# Patient Record
Sex: Male | Born: 1955 | Race: Black or African American | Hispanic: No | Marital: Married | Smoking: Former smoker
Health system: Southern US, Community
[De-identification: ages and names within clinical notes are randomized; demographics above are authoritative.]

## PROBLEM LIST (undated history)

## (undated) DIAGNOSIS — J449 Chronic obstructive pulmonary disease, unspecified: Secondary | ICD-10-CM

## (undated) DIAGNOSIS — I10 Essential (primary) hypertension: Secondary | ICD-10-CM

## (undated) DIAGNOSIS — G4733 Obstructive sleep apnea (adult) (pediatric): Secondary | ICD-10-CM

## (undated) DIAGNOSIS — E785 Hyperlipidemia, unspecified: Secondary | ICD-10-CM

## (undated) DIAGNOSIS — E119 Type 2 diabetes mellitus without complications: Secondary | ICD-10-CM

## (undated) HISTORY — PX: KNEE SURGERY: SHX244

## (undated) HISTORY — DX: Chronic obstructive pulmonary disease, unspecified: J44.9

## (undated) HISTORY — DX: Obstructive sleep apnea (adult) (pediatric): G47.33

## (undated) HISTORY — DX: Type 2 diabetes mellitus without complications: E11.9

## (undated) HISTORY — DX: Hyperlipidemia, unspecified: E78.5

## (undated) HISTORY — PX: HAND SURGERY: SHX662

## (undated) HISTORY — DX: Essential (primary) hypertension: I10

---

## 2007-10-28 ENCOUNTER — Emergency Department: Payer: Self-pay | Admitting: Emergency Medicine

## 2007-10-28 ENCOUNTER — Other Ambulatory Visit: Payer: Self-pay

## 2008-03-17 ENCOUNTER — Emergency Department: Payer: Self-pay | Admitting: Emergency Medicine

## 2010-01-18 ENCOUNTER — Ambulatory Visit: Payer: Self-pay | Admitting: Vascular Surgery

## 2010-08-15 DIAGNOSIS — I10 Essential (primary) hypertension: Secondary | ICD-10-CM | POA: Insufficient documentation

## 2010-08-15 DIAGNOSIS — F325 Major depressive disorder, single episode, in full remission: Secondary | ICD-10-CM | POA: Insufficient documentation

## 2010-08-15 DIAGNOSIS — G40909 Epilepsy, unspecified, not intractable, without status epilepticus: Secondary | ICD-10-CM | POA: Insufficient documentation

## 2011-10-11 DIAGNOSIS — E119 Type 2 diabetes mellitus without complications: Secondary | ICD-10-CM | POA: Insufficient documentation

## 2011-10-11 DIAGNOSIS — E559 Vitamin D deficiency, unspecified: Secondary | ICD-10-CM | POA: Insufficient documentation

## 2012-08-21 DIAGNOSIS — N184 Chronic kidney disease, stage 4 (severe): Secondary | ICD-10-CM | POA: Diagnosis not present

## 2012-09-24 DIAGNOSIS — Z125 Encounter for screening for malignant neoplasm of prostate: Secondary | ICD-10-CM | POA: Diagnosis not present

## 2012-09-24 DIAGNOSIS — I10 Essential (primary) hypertension: Secondary | ICD-10-CM | POA: Diagnosis not present

## 2012-09-24 DIAGNOSIS — E7800 Pure hypercholesterolemia, unspecified: Secondary | ICD-10-CM | POA: Diagnosis not present

## 2012-09-24 DIAGNOSIS — Z1159 Encounter for screening for other viral diseases: Secondary | ICD-10-CM | POA: Diagnosis not present

## 2012-10-28 DIAGNOSIS — I10 Essential (primary) hypertension: Secondary | ICD-10-CM | POA: Diagnosis not present

## 2012-10-28 DIAGNOSIS — E119 Type 2 diabetes mellitus without complications: Secondary | ICD-10-CM | POA: Diagnosis not present

## 2012-10-28 DIAGNOSIS — E785 Hyperlipidemia, unspecified: Secondary | ICD-10-CM | POA: Diagnosis not present

## 2013-06-30 DIAGNOSIS — I10 Essential (primary) hypertension: Secondary | ICD-10-CM | POA: Diagnosis not present

## 2013-06-30 DIAGNOSIS — E7800 Pure hypercholesterolemia, unspecified: Secondary | ICD-10-CM | POA: Diagnosis not present

## 2013-06-30 DIAGNOSIS — E1165 Type 2 diabetes mellitus with hyperglycemia: Secondary | ICD-10-CM | POA: Diagnosis not present

## 2013-06-30 DIAGNOSIS — Z23 Encounter for immunization: Secondary | ICD-10-CM | POA: Diagnosis not present

## 2013-06-30 DIAGNOSIS — N183 Chronic kidney disease, stage 3 unspecified: Secondary | ICD-10-CM | POA: Diagnosis not present

## 2013-06-30 DIAGNOSIS — E1129 Type 2 diabetes mellitus with other diabetic kidney complication: Secondary | ICD-10-CM | POA: Diagnosis not present

## 2013-10-01 DIAGNOSIS — Z125 Encounter for screening for malignant neoplasm of prostate: Secondary | ICD-10-CM | POA: Diagnosis not present

## 2013-10-01 DIAGNOSIS — E1129 Type 2 diabetes mellitus with other diabetic kidney complication: Secondary | ICD-10-CM | POA: Diagnosis not present

## 2013-10-01 DIAGNOSIS — E119 Type 2 diabetes mellitus without complications: Secondary | ICD-10-CM | POA: Diagnosis not present

## 2013-10-01 DIAGNOSIS — E1165 Type 2 diabetes mellitus with hyperglycemia: Secondary | ICD-10-CM | POA: Diagnosis not present

## 2013-10-01 DIAGNOSIS — I10 Essential (primary) hypertension: Secondary | ICD-10-CM | POA: Diagnosis not present

## 2013-10-01 DIAGNOSIS — N183 Chronic kidney disease, stage 3 unspecified: Secondary | ICD-10-CM | POA: Diagnosis not present

## 2013-10-01 DIAGNOSIS — E7800 Pure hypercholesterolemia, unspecified: Secondary | ICD-10-CM | POA: Diagnosis not present

## 2013-10-06 DIAGNOSIS — E559 Vitamin D deficiency, unspecified: Secondary | ICD-10-CM | POA: Diagnosis not present

## 2013-10-06 DIAGNOSIS — R809 Proteinuria, unspecified: Secondary | ICD-10-CM | POA: Insufficient documentation

## 2013-10-06 DIAGNOSIS — I129 Hypertensive chronic kidney disease with stage 1 through stage 4 chronic kidney disease, or unspecified chronic kidney disease: Secondary | ICD-10-CM | POA: Diagnosis not present

## 2013-10-06 DIAGNOSIS — N183 Chronic kidney disease, stage 3 unspecified: Secondary | ICD-10-CM | POA: Diagnosis not present

## 2013-10-06 DIAGNOSIS — E119 Type 2 diabetes mellitus without complications: Secondary | ICD-10-CM | POA: Diagnosis not present

## 2013-10-20 DIAGNOSIS — E1139 Type 2 diabetes mellitus with other diabetic ophthalmic complication: Secondary | ICD-10-CM | POA: Diagnosis not present

## 2013-11-03 DIAGNOSIS — F17200 Nicotine dependence, unspecified, uncomplicated: Secondary | ICD-10-CM | POA: Diagnosis not present

## 2013-11-03 DIAGNOSIS — Z1331 Encounter for screening for depression: Secondary | ICD-10-CM | POA: Diagnosis not present

## 2013-11-03 DIAGNOSIS — Z0000 Encounter for general adult medical examination without abnormal findings: Secondary | ICD-10-CM | POA: Diagnosis not present

## 2013-11-03 DIAGNOSIS — Z1211 Encounter for screening for malignant neoplasm of colon: Secondary | ICD-10-CM | POA: Diagnosis not present

## 2013-11-03 DIAGNOSIS — Z125 Encounter for screening for malignant neoplasm of prostate: Secondary | ICD-10-CM | POA: Diagnosis not present

## 2014-01-06 ENCOUNTER — Ambulatory Visit: Payer: Self-pay | Admitting: Gastroenterology

## 2014-01-06 DIAGNOSIS — J45909 Unspecified asthma, uncomplicated: Secondary | ICD-10-CM | POA: Diagnosis not present

## 2014-01-06 DIAGNOSIS — D125 Benign neoplasm of sigmoid colon: Secondary | ICD-10-CM | POA: Diagnosis not present

## 2014-01-06 DIAGNOSIS — E119 Type 2 diabetes mellitus without complications: Secondary | ICD-10-CM | POA: Diagnosis not present

## 2014-01-06 DIAGNOSIS — K5790 Diverticulosis of intestine, part unspecified, without perforation or abscess without bleeding: Secondary | ICD-10-CM | POA: Diagnosis not present

## 2014-01-06 DIAGNOSIS — Z86010 Personal history of colonic polyps: Secondary | ICD-10-CM | POA: Diagnosis not present

## 2014-01-06 DIAGNOSIS — D123 Benign neoplasm of transverse colon: Secondary | ICD-10-CM | POA: Diagnosis not present

## 2014-01-06 DIAGNOSIS — I10 Essential (primary) hypertension: Secondary | ICD-10-CM | POA: Diagnosis not present

## 2014-01-07 LAB — PATHOLOGY REPORT

## 2014-01-25 DIAGNOSIS — J449 Chronic obstructive pulmonary disease, unspecified: Secondary | ICD-10-CM | POA: Diagnosis not present

## 2014-01-25 DIAGNOSIS — Z23 Encounter for immunization: Secondary | ICD-10-CM | POA: Diagnosis not present

## 2014-01-25 DIAGNOSIS — E780 Pure hypercholesterolemia: Secondary | ICD-10-CM | POA: Diagnosis not present

## 2014-01-25 DIAGNOSIS — Z716 Tobacco abuse counseling: Secondary | ICD-10-CM | POA: Diagnosis not present

## 2014-01-25 DIAGNOSIS — I10 Essential (primary) hypertension: Secondary | ICD-10-CM | POA: Diagnosis not present

## 2014-01-25 DIAGNOSIS — E1165 Type 2 diabetes mellitus with hyperglycemia: Secondary | ICD-10-CM | POA: Diagnosis not present

## 2014-01-25 DIAGNOSIS — E1129 Type 2 diabetes mellitus with other diabetic kidney complication: Secondary | ICD-10-CM | POA: Diagnosis not present

## 2014-03-01 DIAGNOSIS — E1165 Type 2 diabetes mellitus with hyperglycemia: Secondary | ICD-10-CM | POA: Diagnosis not present

## 2014-03-01 DIAGNOSIS — N183 Chronic kidney disease, stage 3 (moderate): Secondary | ICD-10-CM | POA: Diagnosis not present

## 2014-03-01 DIAGNOSIS — E1129 Type 2 diabetes mellitus with other diabetic kidney complication: Secondary | ICD-10-CM | POA: Diagnosis not present

## 2014-03-01 DIAGNOSIS — I10 Essential (primary) hypertension: Secondary | ICD-10-CM | POA: Diagnosis not present

## 2014-03-23 ENCOUNTER — Ambulatory Visit: Payer: Self-pay | Admitting: Family Medicine

## 2014-03-23 DIAGNOSIS — E785 Hyperlipidemia, unspecified: Secondary | ICD-10-CM | POA: Diagnosis not present

## 2014-03-23 DIAGNOSIS — Z7189 Other specified counseling: Secondary | ICD-10-CM | POA: Diagnosis not present

## 2014-03-23 DIAGNOSIS — E119 Type 2 diabetes mellitus without complications: Secondary | ICD-10-CM | POA: Diagnosis not present

## 2014-03-23 DIAGNOSIS — I10 Essential (primary) hypertension: Secondary | ICD-10-CM | POA: Diagnosis not present

## 2014-03-26 ENCOUNTER — Ambulatory Visit: Payer: Self-pay | Admitting: Family Medicine

## 2014-03-26 DIAGNOSIS — I10 Essential (primary) hypertension: Secondary | ICD-10-CM | POA: Diagnosis not present

## 2014-03-26 DIAGNOSIS — E119 Type 2 diabetes mellitus without complications: Secondary | ICD-10-CM | POA: Diagnosis not present

## 2014-03-26 DIAGNOSIS — E785 Hyperlipidemia, unspecified: Secondary | ICD-10-CM | POA: Diagnosis not present

## 2014-04-12 DIAGNOSIS — R809 Proteinuria, unspecified: Secondary | ICD-10-CM | POA: Diagnosis not present

## 2014-04-12 DIAGNOSIS — E559 Vitamin D deficiency, unspecified: Secondary | ICD-10-CM | POA: Diagnosis not present

## 2014-04-12 DIAGNOSIS — N183 Chronic kidney disease, stage 3 (moderate): Secondary | ICD-10-CM | POA: Diagnosis not present

## 2014-04-12 DIAGNOSIS — E785 Hyperlipidemia, unspecified: Secondary | ICD-10-CM | POA: Diagnosis not present

## 2014-04-12 DIAGNOSIS — I10 Essential (primary) hypertension: Secondary | ICD-10-CM | POA: Diagnosis not present

## 2014-04-12 DIAGNOSIS — E119 Type 2 diabetes mellitus without complications: Secondary | ICD-10-CM | POA: Diagnosis not present

## 2014-04-21 DIAGNOSIS — R809 Proteinuria, unspecified: Secondary | ICD-10-CM | POA: Diagnosis not present

## 2014-04-21 DIAGNOSIS — N183 Chronic kidney disease, stage 3 (moderate): Secondary | ICD-10-CM | POA: Diagnosis not present

## 2014-04-21 DIAGNOSIS — E559 Vitamin D deficiency, unspecified: Secondary | ICD-10-CM | POA: Diagnosis not present

## 2014-04-21 DIAGNOSIS — E1122 Type 2 diabetes mellitus with diabetic chronic kidney disease: Secondary | ICD-10-CM | POA: Diagnosis not present

## 2014-04-26 ENCOUNTER — Ambulatory Visit: Payer: Self-pay | Admitting: Family Medicine

## 2014-05-05 DIAGNOSIS — J449 Chronic obstructive pulmonary disease, unspecified: Secondary | ICD-10-CM | POA: Diagnosis not present

## 2014-05-05 DIAGNOSIS — I10 Essential (primary) hypertension: Secondary | ICD-10-CM | POA: Diagnosis not present

## 2014-05-05 DIAGNOSIS — E1122 Type 2 diabetes mellitus with diabetic chronic kidney disease: Secondary | ICD-10-CM | POA: Diagnosis not present

## 2014-05-05 DIAGNOSIS — E119 Type 2 diabetes mellitus without complications: Secondary | ICD-10-CM | POA: Diagnosis not present

## 2014-05-05 DIAGNOSIS — E780 Pure hypercholesterolemia: Secondary | ICD-10-CM | POA: Diagnosis not present

## 2014-05-05 DIAGNOSIS — N183 Chronic kidney disease, stage 3 (moderate): Secondary | ICD-10-CM | POA: Diagnosis not present

## 2014-08-03 DIAGNOSIS — E1122 Type 2 diabetes mellitus with diabetic chronic kidney disease: Secondary | ICD-10-CM | POA: Diagnosis not present

## 2014-08-03 DIAGNOSIS — N183 Chronic kidney disease, stage 3 (moderate): Secondary | ICD-10-CM | POA: Diagnosis not present

## 2014-08-03 DIAGNOSIS — E780 Pure hypercholesterolemia: Secondary | ICD-10-CM | POA: Diagnosis not present

## 2014-08-03 DIAGNOSIS — I10 Essential (primary) hypertension: Secondary | ICD-10-CM | POA: Diagnosis not present

## 2014-08-03 DIAGNOSIS — K121 Other forms of stomatitis: Secondary | ICD-10-CM | POA: Diagnosis not present

## 2014-08-03 DIAGNOSIS — J449 Chronic obstructive pulmonary disease, unspecified: Secondary | ICD-10-CM | POA: Diagnosis not present

## 2014-08-10 DIAGNOSIS — K121 Other forms of stomatitis: Secondary | ICD-10-CM | POA: Diagnosis not present

## 2014-09-03 DIAGNOSIS — E119 Type 2 diabetes mellitus without complications: Secondary | ICD-10-CM | POA: Diagnosis not present

## 2014-09-03 LAB — HM DIABETES EYE EXAM

## 2014-09-14 DIAGNOSIS — H6123 Impacted cerumen, bilateral: Secondary | ICD-10-CM | POA: Diagnosis not present

## 2014-09-14 DIAGNOSIS — H93299 Other abnormal auditory perceptions, unspecified ear: Secondary | ICD-10-CM | POA: Diagnosis not present

## 2014-09-14 DIAGNOSIS — D1030 Benign neoplasm of unspecified part of mouth: Secondary | ICD-10-CM | POA: Diagnosis not present

## 2014-10-25 ENCOUNTER — Other Ambulatory Visit: Payer: Self-pay | Admitting: Family Medicine

## 2014-10-25 DIAGNOSIS — E559 Vitamin D deficiency, unspecified: Secondary | ICD-10-CM | POA: Diagnosis not present

## 2014-10-25 DIAGNOSIS — E1122 Type 2 diabetes mellitus with diabetic chronic kidney disease: Secondary | ICD-10-CM | POA: Diagnosis not present

## 2014-10-25 DIAGNOSIS — N183 Chronic kidney disease, stage 3 (moderate): Secondary | ICD-10-CM | POA: Diagnosis not present

## 2014-10-25 DIAGNOSIS — R809 Proteinuria, unspecified: Secondary | ICD-10-CM | POA: Diagnosis not present

## 2014-10-26 ENCOUNTER — Telehealth: Payer: Self-pay | Admitting: Family Medicine

## 2014-10-26 NOTE — Telephone Encounter (Signed)
Requesting refill on Metoprolol and his sugar medication. States the pharmacy faxed over a request.

## 2014-10-27 ENCOUNTER — Telehealth: Payer: Self-pay | Admitting: Emergency Medicine

## 2014-10-27 NOTE — Telephone Encounter (Signed)
Scripts sent

## 2014-11-08 DIAGNOSIS — E559 Vitamin D deficiency, unspecified: Secondary | ICD-10-CM | POA: Diagnosis not present

## 2014-11-08 DIAGNOSIS — E1122 Type 2 diabetes mellitus with diabetic chronic kidney disease: Secondary | ICD-10-CM | POA: Diagnosis not present

## 2014-11-08 DIAGNOSIS — R809 Proteinuria, unspecified: Secondary | ICD-10-CM | POA: Diagnosis not present

## 2014-11-08 DIAGNOSIS — N183 Chronic kidney disease, stage 3 (moderate): Secondary | ICD-10-CM | POA: Diagnosis not present

## 2014-11-09 ENCOUNTER — Encounter: Payer: Self-pay | Admitting: Family Medicine

## 2014-11-20 ENCOUNTER — Other Ambulatory Visit: Payer: Self-pay | Admitting: Family Medicine

## 2014-12-02 ENCOUNTER — Ambulatory Visit (INDEPENDENT_AMBULATORY_CARE_PROVIDER_SITE_OTHER): Payer: Medicare Other | Admitting: Family Medicine

## 2014-12-02 ENCOUNTER — Encounter: Payer: Self-pay | Admitting: Family Medicine

## 2014-12-02 VITALS — BP 118/64 | HR 67 | Temp 97.9°F | Resp 16 | Ht 68.0 in | Wt 184.0 lb

## 2014-12-02 DIAGNOSIS — Z0000 Encounter for general adult medical examination without abnormal findings: Secondary | ICD-10-CM

## 2014-12-02 DIAGNOSIS — Z1211 Encounter for screening for malignant neoplasm of colon: Secondary | ICD-10-CM

## 2014-12-02 DIAGNOSIS — Z23 Encounter for immunization: Secondary | ICD-10-CM

## 2014-12-02 NOTE — Progress Notes (Signed)
Name: Zachary Wallace   MRN: [id number]    DOB: 1955/06/19   Date:12/02/2014       Progress Note  Subjective  Chief Complaint  Chief Complaint  Patient presents with  . Annual Exam    HPI  Patient here for annual H&P. Baseline medical problems have been stable.  Depression screen PHQ 2/9 12/02/2014  Decreased Interest 0  Down, Depressed, Hopeless 0  PHQ - 2 Score 0   Functional Status Survey: Is the patient deaf or have difficulty hearing?: No Does the patient have difficulty seeing, even when wearing glasses/contacts?: No Does the patient have difficulty concentrating, remembering, or making decisions?: No Does the patient have difficulty walking or climbing stairs?: No Does the patient have difficulty dressing or bathing?: No Does the patient have difficulty doing errands alone such as visiting a doctor's office or shopping?: No  No flowsheet data found.   Past Medical History  Diagnosis Date  . COPD (chronic obstructive pulmonary disease)   . Hyperlipidemia   . Hypertension   . Diabetes mellitus without complication     Social History  Substance Use Topics  . Smoking status: Current Every Day Smoker  . Smokeless tobacco: Not on file  . Alcohol Use: No     Current outpatient prescriptions:  .  aspirin 81 MG tablet, Take 81 mg by mouth., Disp: , Rfl:  .  atorvastatin (LIPITOR) 40 MG tablet, TAKE ONE TABLET BY MOUTH AT BEDTIME., Disp: 30 tablet, Rfl: 0 .  chlorthalidone (HYGROTON) 25 MG tablet, TAKE 1 TABLET BY MOUTH ONCE DAILY., Disp: 30 tablet, Rfl: 0 .  glimepiride (AMARYL) 2 MG tablet, TAKE 1 TABLET BY MOUTH ONCE DAILY., Disp: 30 tablet, Rfl: 3 .  glucose blood (ONETOUCH VERIO) test strip, , Disp: , Rfl:  .  losartan (COZAAR) 100 MG tablet, TAKE 1 TABLET BY MOUTH ONCE DAILY., Disp: 30 tablet, Rfl: 0 .  metoprolol (LOPRESSOR) 100 MG tablet, TAKE 1 TABLET BY MOUTH TWICE DAILY, Disp: 60 tablet, Rfl: 3 .  PROAIR HFA 108 (90 BASE) MCG/ACT inhaler, , Disp: , Rfl:  .   SPIRIVA HANDIHALER 18 MCG inhalation capsule, , Disp: , Rfl:  .  amLODipine (NORVASC) 10 MG tablet, TAKE 1 TABLET BY MOUTH ONCE DAILY., Disp: 30 tablet, Rfl: 0  Not on File  Review of Systems  Constitutional: Negative for fever, chills and weight loss.  HENT: Negative for congestion, hearing loss, sore throat and tinnitus.   Eyes: Negative for blurred vision, double vision and redness.  Respiratory: Positive for cough and sputum production. Negative for hemoptysis and shortness of breath.   Cardiovascular: Negative for chest pain, palpitations, orthopnea, claudication and leg swelling.  Gastrointestinal: Negative for heartburn, nausea, vomiting, diarrhea, constipation and blood in stool.  Genitourinary: Negative for dysuria, urgency, frequency and hematuria.  Musculoskeletal: Negative for myalgias, back pain, joint pain, falls and neck pain.  Skin: Negative for itching.  Neurological: Negative for dizziness, tingling, tremors, focal weakness, seizures, loss of consciousness, weakness and headaches.  Endo/Heme/Allergies: Does not bruise/bleed easily.  Psychiatric/Behavioral: Negative for depression and substance abuse. The patient is not nervous/anxious and does not have insomnia.      Objective  Filed Vitals:   12/02/14 0919  BP: 118/64  Pulse: 67  Temp: 97.9 F (36.6 C)  Resp: 16  Height: 5\' 8"  (1.727 m)  Weight: 184 lb (83.462 kg)  SpO2: 94%     Physical Exam  Constitutional: He is oriented to person, place, and time and well-developed,  well-nourished, and in no distress.  HENT:  Head: Normocephalic.  Eyes: EOM are normal. Pupils are equal, round, and reactive to light.  Neck: Normal range of motion. Neck supple. No thyromegaly present.  Cardiovascular: Normal rate, regular rhythm, normal heart sounds and intact distal pulses.   No murmur heard. Pulmonary/Chest: Effort normal and breath sounds normal. No respiratory distress. He has no wheezes.  Abdominal: Soft. Bowel  sounds are normal.  Genitourinary: Rectum normal, prostate normal and penis normal. Guaiac negative stool. No discharge found.  Musculoskeletal: Normal range of motion. He exhibits no edema.  Lymphadenopathy:    He has no cervical adenopathy.  Neurological: He is alert and oriented to person, place, and time. No cranial nerve deficit. Gait normal. Coordination normal.  Skin: Skin is warm and dry. No rash noted.  Psychiatric: Affect and judgment normal.      Assessment & Plan

## 2014-12-06 ENCOUNTER — Ambulatory Visit: Payer: Self-pay | Admitting: Family Medicine

## 2015-01-10 ENCOUNTER — Ambulatory Visit (INDEPENDENT_AMBULATORY_CARE_PROVIDER_SITE_OTHER): Payer: Medicare Other | Admitting: Family Medicine

## 2015-01-10 ENCOUNTER — Encounter: Payer: Self-pay | Admitting: Family Medicine

## 2015-01-10 ENCOUNTER — Ambulatory Visit
Admission: RE | Admit: 2015-01-10 | Discharge: 2015-01-10 | Disposition: A | Discharge status: Home / Self Care | Payer: Medicare Other | Source: Ambulatory Visit | Attending: Family Medicine | Admitting: Family Medicine

## 2015-01-10 VITALS — BP 128/88 | HR 70 | Temp 98.7°F | Resp 16 | Ht 68.0 in | Wt 186.4 lb

## 2015-01-10 DIAGNOSIS — N183 Chronic kidney disease, stage 3 unspecified: Secondary | ICD-10-CM

## 2015-01-10 DIAGNOSIS — M545 Low back pain: Secondary | ICD-10-CM | POA: Diagnosis not present

## 2015-01-10 DIAGNOSIS — I10 Essential (primary) hypertension: Secondary | ICD-10-CM

## 2015-01-10 DIAGNOSIS — M25551 Pain in right hip: Secondary | ICD-10-CM

## 2015-01-10 DIAGNOSIS — M25552 Pain in left hip: Secondary | ICD-10-CM | POA: Insufficient documentation

## 2015-01-10 DIAGNOSIS — M5416 Radiculopathy, lumbar region: Secondary | ICD-10-CM | POA: Diagnosis not present

## 2015-01-10 DIAGNOSIS — E1169 Type 2 diabetes mellitus with other specified complication: Secondary | ICD-10-CM

## 2015-01-10 LAB — GLUCOSE, POCT (MANUAL RESULT ENTRY): POC Glucose: 174 mg/dL — AB (ref 70–99)

## 2015-01-10 LAB — POCT UA - MICROALBUMIN: MICROALBUMIN (UR) POC: 20 mg/L

## 2015-01-10 LAB — POCT GLYCOSYLATED HEMOGLOBIN (HGB A1C): Hemoglobin A1C: 6.5

## 2015-01-10 MED ORDER — TRAMADOL HCL 50 MG PO TABS: 50.0000 mg | ORAL_TABLET | Freq: Three times a day (TID) | ORAL | Status: DC | PRN

## 2015-01-10 NOTE — Progress Notes (Signed)
Name: Zachary Wallace   MRN: [id number]    DOB: 1955/09/14   Date:01/10/2015       Progress Note  Subjective  Chief Complaint  Chief Complaint  Patient presents with  . Diabetes    pt here for 1 month follow up  . Chronic Kidney Disease  . COPD  . Hypertension    HPI  Diabetes  Patient presents for follow-up of diabetes which is present for over 5 years. Is currently on a regimen of glimepiride2 . Patient states somewhat compliant with their diet and exercise. There's been no hypoglycemic episodes and there no symptoms polyuria polydipsia polyphagia. His average fasting glucoses been in the low around mid 150s with a high around 174 . There is chronic renal end organ disease.  Last diabetic eye exam was within the last year.   Last visit with dietitian was 1 year ago today. Last microalbumin was obtained today .   Hypertension   Patient presents for follow-up of hypertension. It has been present for over over 5 years.  Patient states that there is compliance with medical regimen which consists of amlodipine 10 mg daily losartan 100 mg daily . There is no end organ disease. Cardiac risk factors include hypertension hyperlipidemia and diabetes.  Exercise regimen consist of limited walking .  Diet consist of some salt restriction .  COPD history of present illness  Patient continues to smoke about 1 pack per week. He has minimal cough. Is currently on Spiriva once daily and uses albuterol (since he is not RECENTLY. There've been no recent ER visits severe upper respiratory infection  Hyperlipidemia  Patient has a history of hyperlipidemia for over 5 years .  Current medical regimen consist of atorvastatin grams daily at bedtime .  Compliance is good .  Diet and exercise are currently followed intermittently .  Risk factors for cardiovascular disease include hyperlipidemia , hypertension diabetes, tobacco abuse .   There have been no side effects from the medication.    Past Medical History   Diagnosis Date  . COPD (chronic obstructive pulmonary disease) ([place])   . Hyperlipidemia   . Hypertension   . Diabetes mellitus without complication [facility])     Social History  Substance Use Topics  . Smoking status: Current Every Day Smoker  . Smokeless tobacco: Not on file  . Alcohol Use: No     Current outpatient prescriptions:  .  amLODipine (NORVASC) 10 MG tablet, TAKE 1 TABLET BY MOUTH ONCE DAILY., Disp: 30 tablet, Rfl: 0 .  aspirin 81 MG tablet, Take 81 mg by mouth., Disp: , Rfl:  .  atorvastatin (LIPITOR) 40 MG tablet, TAKE ONE TABLET BY MOUTH AT BEDTIME., Disp: 30 tablet, Rfl: 0 .  chlorthalidone (HYGROTON) 25 MG tablet, TAKE 1 TABLET BY MOUTH ONCE DAILY., Disp: 30 tablet, Rfl: 0 .  glimepiride (AMARYL) 2 MG tablet, TAKE 1 TABLET BY MOUTH ONCE DAILY., Disp: 30 tablet, Rfl: 3 .  glucose blood (ONETOUCH VERIO) test strip, , Disp: , Rfl:  .  losartan (COZAAR) 100 MG tablet, TAKE 1 TABLET BY MOUTH ONCE DAILY., Disp: 30 tablet, Rfl: 0 .  metoprolol (LOPRESSOR) 100 MG tablet, TAKE 1 TABLET BY MOUTH TWICE DAILY, Disp: 60 tablet, Rfl: 3 .  PROAIR HFA 108 (90 BASE) MCG/ACT inhaler, , Disp: , Rfl:  .  SPIRIVA HANDIHALER 18 MCG inhalation capsule, , Disp: , Rfl:  .  chlorhexidine (PERIDEX) 0.12 % solution, , Disp: , Rfl:  .  HYDROcodone-acetaminophen (NORCO) 7.5-325 MG  tablet, , Disp: , Rfl:  .  traMADol (ULTRAM) 50 MG tablet, Take 1 tablet (50 mg total) by mouth every 8 (eight) hours as needed., Disp: 30 tablet, Rfl: 0  No Known Allergies  Review of Systems  Constitutional: Negative for fever, chills and weight loss.  HENT: Negative for congestion, hearing loss, sore throat and tinnitus.   Eyes: Negative for blurred vision, double vision and redness.  Respiratory: Positive for cough and sputum production. Negative for hemoptysis, shortness of breath and wheezing.   Cardiovascular: Negative for chest pain, palpitations, orthopnea, claudication and leg swelling.  Gastrointestinal:  Negative.  Negative for heartburn, nausea, vomiting, diarrhea, constipation and blood in stool.  Genitourinary: Negative.  Negative for dysuria, urgency, frequency and hematuria.  Musculoskeletal: Positive for joint pain (left hip pain left hip). Negative for myalgias, back pain, falls and neck pain.  Skin: Negative for itching.  Neurological: Negative for dizziness, tingling, tremors, focal weakness, seizures, loss of consciousness, weakness and headaches.  Endo/Heme/Allergies: Does not bruise/bleed easily.  Psychiatric/Behavioral: Positive for depression (improved). Negative for substance abuse. The patient is not nervous/anxious and does not have insomnia.      Objective  Filed Vitals:   01/10/15 0816  BP: 128/88  Pulse: 70  Temp: 98.7 F (37.1 C)  Resp: 16  Height: 5\' 8"  (1.727 m)  Weight: 186 lb 7 oz (84.567 kg)  SpO2: 94%     Physical Exam  Constitutional: He is oriented to person, place, and time and well-developed, well-nourished, and in no distress.  HENT:  Head: Normocephalic.  Eyes: EOM are normal. Pupils are equal, round, and reactive to light.  Neck: Normal range of motion. Neck supple. No thyromegaly present.  Cardiovascular: Normal rate, regular rhythm and normal heart sounds.   No murmur heard. Pulmonary/Chest: Effort normal. No respiratory distress. He has no wheezes.  Diminished breath sounds throughout with hyperresonance to percussion.  PFTs show very severe obstruction prebronchodilator ends here postbronchodilator FEV1 markedly reduced   Abdominal: Soft. Bowel sounds are normal.  Musculoskeletal: Normal range of motion. He exhibits no edema.  Lymphadenopathy:    He has no cervical adenopathy.  Neurological: He is alert and oriented to person, place, and time. No cranial nerve deficit. Gait normal. Coordination normal.  Skin: Skin is warm and dry. No rash noted.  Psychiatric: Affect and judgment normal.      Assessment & Plan  1. Type 2 diabetes  mellitus with other specified complication (HCC) Well-controlled - POCT Glucose (CBG) - POCT HgB A1C - POCT UA - Microalbumin  2. CKD (chronic kidney disease), unspecified stage Followed by nephrologist  3. Essential hypertension Well-controlled  4. Chronic kidney disease (CKD), stage III (moderate) Stable nephrologist  5. Lumbar radiculopathy Significant. We'll obtain LS-spine - traMADol (ULTRAM) 50 MG tablet; Take 1 tablet (50 mg total) by mouth every 8 (eight) hours as needed.  Dispense: 30 tablet; Refill: 0  6. Left hip pain Left hip x-ray will start tramadol - traMADol (ULTRAM) 50 MG tablet; Take 1 tablet (50 mg total) by mouth every 8 (eight) hours as needed.  Dispense: 30 tablet; Refill: 0  7. Hip pain, acute, right Less severe than left - DG Lumbar Spine Complete; Future - DG HIP UNILAT WITH PELVIS 2-3 VIEWS RIGHT; Future

## 2015-01-13 ENCOUNTER — Encounter: Payer: Self-pay | Admitting: Family Medicine

## 2015-01-28 ENCOUNTER — Other Ambulatory Visit: Payer: Self-pay | Admitting: Emergency Medicine

## 2015-01-28 MED ORDER — AMLODIPINE BESYLATE 10 MG PO TABS: 10.0000 mg | ORAL_TABLET | Freq: Every day | ORAL | Status: DC

## 2015-01-28 MED ORDER — LOSARTAN POTASSIUM 100 MG PO TABS: 100.0000 mg | ORAL_TABLET | Freq: Every day | ORAL | Status: DC

## 2015-02-10 ENCOUNTER — Ambulatory Visit (INDEPENDENT_AMBULATORY_CARE_PROVIDER_SITE_OTHER): Payer: Medicare Other | Admitting: Family Medicine

## 2015-02-10 ENCOUNTER — Encounter: Payer: Self-pay | Admitting: Family Medicine

## 2015-02-10 DIAGNOSIS — M5416 Radiculopathy, lumbar region: Secondary | ICD-10-CM

## 2015-02-10 DIAGNOSIS — M25552 Pain in left hip: Secondary | ICD-10-CM

## 2015-02-10 DIAGNOSIS — J449 Chronic obstructive pulmonary disease, unspecified: Secondary | ICD-10-CM | POA: Insufficient documentation

## 2015-02-10 MED ORDER — TRAMADOL HCL 50 MG PO TABS: 50.0000 mg | ORAL_TABLET | Freq: Three times a day (TID) | ORAL | Status: DC | PRN

## 2015-02-10 MED ORDER — GABAPENTIN 300 MG PO CAPS: 300.0000 mg | ORAL_CAPSULE | Freq: Three times a day (TID) | ORAL | Status: DC

## 2015-02-10 NOTE — Progress Notes (Signed)
Name: Zachary Wallace   MRN: [id number]    DOB: 1955-04-05   Date:02/10/2015       Progress Note  Subjective  Chief Complaint  Chief Complaint  Patient presents with  . Back Pain    1 month follow up, pain radiates down right leg    HPI  Low back pain.  Patient's l pain  lumbarwith radiculopathy to the right lower extremity is slightly improved on the tramadol. He had LS-spine x-ray performed. This showed no significant findings. The pain is keeping him awake a lot at night tramadol is providing some improvement   Past Medical History  Diagnosis Date  . COPD (chronic obstructive pulmonary disease) ([place])   . Hyperlipidemia   . Hypertension   . Diabetes mellitus without complication [facility])     Social History  Substance Use Topics  . Smoking status: Current Every Day Smoker  . Smokeless tobacco: Not on file  . Alcohol Use: No     Current outpatient prescriptions:  .  amLODipine (NORVASC) 10 MG tablet, Take 1 tablet (10 mg total) by mouth daily., Disp: 30 tablet, Rfl: 5 .  aspirin 81 MG tablet, Take 81 mg by mouth., Disp: , Rfl:  .  atorvastatin (LIPITOR) 40 MG tablet, TAKE ONE TABLET BY MOUTH AT BEDTIME., Disp: 30 tablet, Rfl: 0 .  chlorhexidine (PERIDEX) 0.12 % solution, , Disp: , Rfl:  .  chlorthalidone (HYGROTON) 25 MG tablet, TAKE 1 TABLET BY MOUTH ONCE DAILY., Disp: 30 tablet, Rfl: 0 .  glimepiride (AMARYL) 2 MG tablet, TAKE 1 TABLET BY MOUTH ONCE DAILY., Disp: 30 tablet, Rfl: 3 .  glucose blood (ONETOUCH VERIO) test strip, , Disp: , Rfl:  .  HYDROcodone-acetaminophen (NORCO) 7.5-325 MG tablet, , Disp: , Rfl:  .  losartan (COZAAR) 100 MG tablet, Take 1 tablet (100 mg total) by mouth daily., Disp: 30 tablet, Rfl: 5 .  metoprolol (LOPRESSOR) 100 MG tablet, TAKE 1 TABLET BY MOUTH TWICE DAILY, Disp: 60 tablet, Rfl: 3 .  PROAIR HFA 108 (90 BASE) MCG/ACT inhaler, , Disp: , Rfl:  .  SPIRIVA HANDIHALER 18 MCG inhalation capsule, , Disp: , Rfl:  .  traMADol (ULTRAM) 50 MG tablet,  Take 1 tablet (50 mg total) by mouth every 8 (eight) hours as needed., Disp: 30 tablet, Rfl: 0  No Known Allergies  Review of Systems  Musculoskeletal: Positive for back pain.  Neurological:       Radicular pain down the right lower extremity to the lateral aspect of the mid calf.     Objective  Filed Vitals:   02/10/15 0814  BP: 118/76  Pulse: 70  Temp: 98.6 F (37 C)  TempSrc: Oral  Resp: 18  Height: 5\' 8"  (1.727 m)  Weight: 185 lb 8 oz (84.142 kg)  SpO2: 94%     Physical Exam  Constitutional: He is well-developed, well-nourished, and in no distress.  Musculoskeletal:  Tender along the lower lumbar area. Straight leg raising is positive at 60 on the right negative on the left. Diminished DTRs.      Assessment & Plan  1. Lumbar radiculopathy Lab to avoid NSAIDs in view of risks EKG - traMADol (ULTRAM) 50 MG tablet; Take 1 tablet (50 mg total) by mouth every 8 (eight) hours as needed.  Dispense: 90 tablet; Refill: 0  2. Left hip pain  - traMADol (ULTRAM) 50 MG tablet; Take 1 tablet (50 mg total) by mouth every 8 (eight) hours as needed.  Dispense: 90 tablet; Refill:  0      

## 2015-02-23 ENCOUNTER — Telehealth: Payer: Self-pay

## 2015-02-23 NOTE — Telephone Encounter (Signed)
LFT MESS FOR PT TO CALL AND WAS ASKED TO SCHEDULE AN APPT TO DISCUSS WHAT Fox IS NEEIDNG.

## 2015-02-23 NOTE — Telephone Encounter (Signed)
Ask patient to return to office and we can discuss the relationship of his leg pain in his work situation

## 2015-02-23 NOTE — Telephone Encounter (Signed)
Santiago Glad from [employer] called and stated that she needs a work status note for pt. Pt was seen on 01/10/15 and was given a RTW note for 01/12/15. Her number is [phone] and claim number for case is [id number]

## 2015-02-28 ENCOUNTER — Ambulatory Visit (INDEPENDENT_AMBULATORY_CARE_PROVIDER_SITE_OTHER): Payer: Medicare Other | Admitting: Family Medicine

## 2015-02-28 ENCOUNTER — Encounter: Payer: Self-pay | Admitting: Family Medicine

## 2015-02-28 VITALS — BP 142/84 | HR 104 | Temp 97.8°F | Resp 18 | Ht 68.0 in | Wt 189.4 lb

## 2015-02-28 DIAGNOSIS — M545 Low back pain, unspecified: Secondary | ICD-10-CM

## 2015-02-28 DIAGNOSIS — M5416 Radiculopathy, lumbar region: Secondary | ICD-10-CM

## 2015-02-28 DIAGNOSIS — M5417 Radiculopathy, lumbosacral region: Secondary | ICD-10-CM | POA: Diagnosis not present

## 2015-02-28 DIAGNOSIS — G8929 Other chronic pain: Secondary | ICD-10-CM | POA: Diagnosis not present

## 2015-02-28 MED ORDER — CYCLOBENZAPRINE HCL 5 MG PO TABS: 5.0000 mg | ORAL_TABLET | Freq: Two times a day (BID) | ORAL | Status: DC

## 2015-02-28 NOTE — Progress Notes (Signed)
Name: Zachary Wallace   MRN: [id number]    DOB: 03-31-1955   Date:02/28/2015       Progress Note  Subjective  Chief Complaint  Chief Complaint  Patient presents with  . Back Pain    follow up    Back Pain This is a chronic problem. The current episode started more than 1 month ago. The problem occurs constantly. The problem is unchanged. The pain is present in the lumbar spine. The quality of the pain is described as aching and cramping. The pain radiates to the right knee, right thigh and right foot. The pain is at a severity of 7/10. The pain is moderate. The pain is the same all the time. The symptoms are aggravated by bending, coughing and twisting. Stiffness is present all day. Associated symptoms include leg pain, numbness and tingling. Pertinent negatives include no bladder incontinence, bowel incontinence, chest pain, dysuria, fever, headaches, pelvic pain, perianal numbness, weakness or weight loss. Risk factors include lack of exercise and recent trauma. He has tried analgesics, bed rest, muscle relaxant, NSAIDs, walking and heat for the symptoms. The treatment provided mild relief.   The incident occurred on 10/17 approximately. Patient was lifting some materials at work when he had acute onset of the right lumbar pain which radiated to his right lower extremit at best at the pain has been 7 out of 10 but his highest 10 out of 10 initially. 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 It was while lifting 61. That he first head injury occur.    we have spoken with his employer. They're wanting written information on his work limitations   Past Medical History  Diagnosis Date  . COPD (chronic obstructive pulmonary disease) ([place])   . Hyperlipidemia   . Hypertension   . Diabetes mellitus without complication [facility])      Social History  Substance Use Topics  . Smoking status: Current Every Day Smoker  . Smokeless tobacco: Not on file  . Alcohol Use: No     Current outpatient prescriptions:  .  amLODipine (NORVASC) 10 MG tablet, Take 1 tablet (10 mg total) by mouth daily., Disp: 30 tablet, Rfl: 5 .  aspirin 81 MG tablet, Take 81 mg by mouth., Disp: , Rfl:  .  atorvastatin (LIPITOR) 40 MG tablet, TAKE ONE TABLET BY MOUTH AT BEDTIME., Disp: 30 tablet, Rfl: 0 .  chlorhexidine (PERIDEX) 0.12 % solution, , Disp: , Rfl:  .  chlorthalidone (HYGROTON) 25 MG tablet, TAKE 1 TABLET BY MOUTH ONCE DAILY., Disp: 30 tablet, Rfl: 0 .  gabapentin (NEURONTIN) 300 MG capsule, Take 1 capsule (300 mg total) by mouth 3 (three) times daily., Disp: 90 capsule, Rfl: 3 .  glimepiride (AMARYL) 2 MG tablet, TAKE 1 TABLET BY MOUTH ONCE DAILY., Disp: 30 tablet, Rfl: 3 .  glucose blood (ONETOUCH VERIO) test strip, , Disp: , Rfl:  .  HYDROcodone-acetaminophen (NORCO) 7.5-325 MG tablet, , Disp: , Rfl:  .  losartan (COZAAR) 100 MG tablet, Take 1 tablet (100 mg total) by mouth daily., Disp: 30 tablet, Rfl: 5 .  metoprolol (LOPRESSOR) 100 MG tablet, TAKE 1 TABLET BY MOUTH TWICE DAILY, Disp: 60 tablet, Rfl: 3 .  PROAIR HFA 108 (90 BASE) MCG/ACT inhaler, , Disp: , Rfl:  .  SPIRIVA HANDIHALER 18 MCG inhalation capsule, , Disp: , Rfl:  .  traMADol (ULTRAM) 50 MG tablet, Take 1 tablet (50 mg total) by mouth every 8 (eight) hours as needed., Disp: 90 tablet, Rfl: 0  No Known Allergies  Review of Systems  Constitutional: Negative for fever, chills and weight loss.  HENT: Negative for congestion, hearing loss, sore throat and tinnitus.   Eyes: Negative for blurred vision, double vision and redness.  Respiratory: Negative for cough, hemoptysis and shortness of breath.   Cardiovascular: Negative for chest pain, palpitations, orthopnea, claudication and leg swelling.  Gastrointestinal: Negative for heartburn, nausea, vomiting, diarrhea,  constipation, blood in stool and bowel incontinence.  Genitourinary: Negative for bladder incontinence, dysuria, urgency, frequency, hematuria and pelvic pain.  Musculoskeletal: Positive for back pain. Negative for myalgias, joint pain, falls and neck pain.  Skin: Negative for itching.  Neurological: Positive for tingling and numbness. Negative for dizziness, tremors, focal weakness, seizures, loss of consciousness, weakness and headaches.  Endo/Heme/Allergies: Does not bruise/bleed easily.  Psychiatric/Behavioral: Negative for depression and substance abuse. The patient is not nervous/anxious and does not have insomnia.      Objective  Filed Vitals:   02/28/15 0735  BP: 142/84  Pulse: 104  Temp: 97.8 F (36.6 C)  TempSrc: Oral  Resp: 18  Height: 5\' 8"  (1.727 m)  Weight: 189 lb 6.4 oz (85.911 kg)  SpO2: 92%     Physical Exam  Constitutional: He is well-developed, well-nourished, and in no distress.  Musculoskeletal:  Muscle areas in the lumbar area with spasm greater on the right than left. There is tenderness to palpation over the sacral area particularly on the right. Straight leg raising is positive at 70 and on the left and 60 on the right. DTRs are diminished.      Assessment & Plan  1. Chronic LBP  - MR Lumbar Spine Wo Contrast; Future  2. Lumbar back pain with radiculopathy affecting right lower extremity  Add Flexeril 10 mg twice a day and return to office for recheck in 1 month

## 2015-03-01 ENCOUNTER — Other Ambulatory Visit: Payer: Self-pay | Admitting: Family Medicine

## 2015-03-14 ENCOUNTER — Ambulatory Visit: Payer: Medicare Other | Admitting: Family Medicine

## 2015-04-01 ENCOUNTER — Ambulatory Visit (INDEPENDENT_AMBULATORY_CARE_PROVIDER_SITE_OTHER): Payer: Medicare Other | Admitting: Family Medicine

## 2015-04-01 ENCOUNTER — Encounter: Payer: Self-pay | Admitting: Family Medicine

## 2015-04-01 VITALS — BP 128/82 | HR 65 | Temp 98.4°F | Resp 18 | Ht 68.0 in | Wt 190.6 lb

## 2015-04-01 DIAGNOSIS — M5416 Radiculopathy, lumbar region: Secondary | ICD-10-CM | POA: Diagnosis not present

## 2015-04-01 NOTE — Progress Notes (Signed)
Name: Zachary Wallace   MRN: [id number]    DOB: 28-Mar-1955   Date:04/01/2015       Progress Note  Subjective  Chief Complaint  Chief Complaint  Patient presents with  . Back Pain    1 month follow up     Back Pain This is a chronic problem. The current episode started more than 1 month ago. The problem occurs constantly. The problem has been gradually improving since onset. Pain location: L2-3 nerve root distribution anterior thigh. The quality of the pain is described as aching and shooting. The pain is moderate. The pain is worse during the night. The symptoms are aggravated by lying down. Associated symptoms include leg pain. Pertinent negatives include no chest pain, dysuria, fever, headaches, numbness, paresthesias, tingling, weakness or weight loss. He has tried muscle relaxant (Gabapentin and intermittent Toradol) for the symptoms.    Back pain follow-up  X-ray of the back plain films was negative.    Past Medical History  Diagnosis Date  . COPD (chronic obstructive pulmonary disease) ([place])   . Hyperlipidemia   . Hypertension   . Diabetes mellitus without complication [facility])     Social History  Substance Use Topics  . Smoking status: Current Every Day Smoker  . Smokeless tobacco: Not on file  . Alcohol Use: No     Current outpatient prescriptions:  .  amLODipine (NORVASC) 10 MG tablet, TAKE 1 TABLET BY MOUTH ONCE DAILY., Disp: 30 tablet, Rfl: 0 .  aspirin 81 MG tablet, Take 81 mg by mouth., Disp: , Rfl:  .  atorvastatin (LIPITOR) 40 MG tablet, TAKE ONE TABLET BY MOUTH AT BEDTIME., Disp: 30 tablet, Rfl: 0 .  chlorhexidine (PERIDEX) 0.12 % solution, , Disp: , Rfl:  .  chlorthalidone (HYGROTON) 25 MG tablet, TAKE 1 TABLET BY MOUTH ONCE DAILY., Disp: 30 tablet, Rfl: 0 .  cyclobenzaprine (FLEXERIL) 5 MG tablet, Take 1 tablet (5 mg total) by mouth 2 (two) times daily., Disp: 30 tablet, Rfl: 1 .  gabapentin (NEURONTIN) 300 MG capsule, Take 1 capsule (300 mg total) by mouth 3  (three) times daily., Disp: 90 capsule, Rfl: 3 .  glimepiride (AMARYL) 2 MG tablet, TAKE 1 TABLET BY MOUTH ONCE DAILY., Disp: 30 tablet, Rfl: 3 .  glucose blood (ONETOUCH VERIO) test strip, , Disp: , Rfl:  .  HYDROcodone-acetaminophen (NORCO) 7.5-325 MG tablet, , Disp: , Rfl:  .  losartan (COZAAR) 100 MG tablet, TAKE 1 TABLET BY MOUTH ONCE DAILY., Disp: 30 tablet, Rfl: 0 .  metoprolol (LOPRESSOR) 100 MG tablet, TAKE 1 TABLET BY MOUTH TWICE DAILY, Disp: 60 tablet, Rfl: 3 .  PROAIR HFA 108 (90 BASE) MCG/ACT inhaler, INHALE 2 PUFFS BY MOUTH FOUR TIMES DAILY AS NEEDED, Disp: 8.5 g, Rfl: 0 .  SPIRIVA HANDIHALER 18 MCG inhalation capsule, INHALE 1 CAPSULE AS DIRECTED ONCE DAILY., Disp: 30 capsule, Rfl: 0 .  traMADol (ULTRAM) 50 MG tablet, Take 1 tablet (50 mg total) by mouth every 8 (eight) hours as needed., Disp: 90 tablet, Rfl: 0  No Known Allergies  Review of Systems  Constitutional: Negative for fever, chills and weight loss.  HENT: Negative for congestion, hearing loss, sore throat and tinnitus.   Eyes: Negative for blurred vision, double vision and redness.  Respiratory: Negative for cough, hemoptysis and shortness of breath.   Cardiovascular: Negative for chest pain, palpitations, orthopnea, claudication and leg swelling.  Gastrointestinal: Negative for heartburn, nausea, vomiting, diarrhea, constipation and blood in stool.  Genitourinary: Negative for dysuria,  urgency, frequency and hematuria.  Musculoskeletal: Positive for back pain. Negative for myalgias, joint pain, falls and neck pain.  Skin: Negative for itching.  Neurological: Negative for dizziness, tingling, tremors, focal weakness, seizures, loss of consciousness, weakness, numbness, headaches and paresthesias.  Endo/Heme/Allergies: Does not bruise/bleed easily.  Psychiatric/Behavioral: Negative for depression and substance abuse. The patient is not nervous/anxious and does not have insomnia.      Objective  Filed Vitals:    04/01/15 0731  BP: 128/82  Pulse: 65  Temp: 98.4 F (36.9 C)  TempSrc: Oral  Resp: 18  Height: 5\' 8"  (1.727 m)  Weight: 190 lb 9.6 oz (86.456 kg)  SpO2: 96%     Physical Exam  Musculoskeletal: He exhibits tenderness.  Straight leg raising is negative. There is tenderness in the distribution of the right L2 through 3 2  nerve root distribution.      Assessment & Plan  1. Lumbar radiculopathy on the right with radiation to the right anterior thigh L2-3 Continue conservative management as is improving with gabapentin and intermittent tramadol if worsens we'll obtain MRI

## 2015-04-22 ENCOUNTER — Other Ambulatory Visit: Payer: Self-pay | Admitting: Family Medicine

## 2015-05-06 ENCOUNTER — Other Ambulatory Visit: Payer: Self-pay | Admitting: Family Medicine

## 2015-05-09 ENCOUNTER — Other Ambulatory Visit: Payer: Self-pay

## 2015-05-09 MED ORDER — SILDENAFIL CITRATE 100 MG PO TABS: 100.0000 mg | ORAL_TABLET | Freq: Every day | ORAL | Status: DC

## 2015-05-16 ENCOUNTER — Telehealth: Payer: Self-pay

## 2015-05-16 NOTE — Telephone Encounter (Signed)
I called [employer] on 05/16/15 at 4 p.m. to try to get MRI approved for patient but they denied it due to being considered a non-medical necessitate. [employer] wants patient to try steroid injections and Physical therapy first. Dr. Rutherford Nail can call and do a peer to peer if he feels as if the patient needs a MRI as a medically necessity at [phone].

## 2015-05-17 ENCOUNTER — Other Ambulatory Visit: Payer: Self-pay

## 2015-05-17 MED ORDER — SILDENAFIL CITRATE 100 MG PO TABS: 100.0000 mg | ORAL_TABLET | Freq: Every day | ORAL | Status: DC

## 2015-05-17 NOTE — Telephone Encounter (Signed)
Pt called back and I informed him of his insurance decision and pt stated that he is willing to try the injections and physical therapy as long as his insurance will pay for them. Was just informed that this is a workers comp claim and that it should be filed with his job. Left vm for pt to return my call.

## 2015-05-23 ENCOUNTER — Other Ambulatory Visit: Payer: Self-pay | Admitting: Family Medicine

## 2015-05-28 ENCOUNTER — Other Ambulatory Visit: Payer: Self-pay | Admitting: Family Medicine

## 2015-06-20 ENCOUNTER — Other Ambulatory Visit: Payer: Self-pay | Admitting: Family Medicine

## 2015-07-20 ENCOUNTER — Other Ambulatory Visit: Payer: Self-pay | Admitting: Family Medicine

## 2015-08-01 ENCOUNTER — Ambulatory Visit: Payer: Medicare Other | Admitting: Family Medicine

## 2015-08-02 ENCOUNTER — Other Ambulatory Visit: Payer: Self-pay | Admitting: Family Medicine

## 2015-08-16 ENCOUNTER — Encounter: Payer: Self-pay | Admitting: Family Medicine

## 2015-08-16 ENCOUNTER — Ambulatory Visit (INDEPENDENT_AMBULATORY_CARE_PROVIDER_SITE_OTHER): Payer: Medicare Other | Admitting: Family Medicine

## 2015-08-16 VITALS — BP 106/72 | HR 59 | Temp 97.8°F | Resp 16 | Wt 188.0 lb

## 2015-08-16 DIAGNOSIS — L309 Dermatitis, unspecified: Secondary | ICD-10-CM

## 2015-08-16 DIAGNOSIS — N183 Chronic kidney disease, stage 3 unspecified: Secondary | ICD-10-CM

## 2015-08-16 DIAGNOSIS — I10 Essential (primary) hypertension: Secondary | ICD-10-CM | POA: Diagnosis not present

## 2015-08-16 DIAGNOSIS — F325 Major depressive disorder, single episode, in full remission: Secondary | ICD-10-CM

## 2015-08-16 DIAGNOSIS — E119 Type 2 diabetes mellitus without complications: Secondary | ICD-10-CM | POA: Diagnosis not present

## 2015-08-16 DIAGNOSIS — J42 Unspecified chronic bronchitis: Secondary | ICD-10-CM | POA: Diagnosis not present

## 2015-08-16 DIAGNOSIS — M5416 Radiculopathy, lumbar region: Secondary | ICD-10-CM | POA: Diagnosis not present

## 2015-08-16 DIAGNOSIS — Z5181 Encounter for therapeutic drug level monitoring: Secondary | ICD-10-CM | POA: Diagnosis not present

## 2015-08-16 DIAGNOSIS — G40909 Epilepsy, unspecified, not intractable, without status epilepticus: Secondary | ICD-10-CM

## 2015-08-16 MED ORDER — TRIAMCINOLONE ACETONIDE 0.5 % EX OINT: 1.0000 "application " | TOPICAL_OINTMENT | Freq: Two times a day (BID) | CUTANEOUS | Status: DC

## 2015-08-16 MED ORDER — SILDENAFIL CITRATE 100 MG PO TABS: 100.0000 mg | ORAL_TABLET | Freq: Every day | ORAL | Status: DC

## 2015-08-16 MED ORDER — CHLORTHALIDONE 25 MG PO TABS: 12.5000 mg | ORAL_TABLET | Freq: Every day | ORAL | Status: DC

## 2015-08-16 NOTE — Assessment & Plan Note (Signed)
Check A1c today, foot exam by MD; eye exams yearly

## 2015-08-16 NOTE — Assessment & Plan Note (Signed)
contnue inhalers; please do try to quit, I am here to help

## 2015-08-16 NOTE — Assessment & Plan Note (Signed)
On no medicine; mood is doing well

## 2015-08-16 NOTE — Assessment & Plan Note (Signed)
Patient had a single seizure in 2012; nothing since then; not on medicine; not sure what this is about, not focus of today's visit

## 2015-08-16 NOTE — Assessment & Plan Note (Signed)
Try DASH guidelines; decrease chlorthalidone; check BP and call me if over 130 on top

## 2015-08-16 NOTE — Assessment & Plan Note (Signed)
Does not need refills of the hydrocodone today he says

## 2015-08-16 NOTE — Progress Notes (Signed)
BP 106/72 mmHg  Pulse 59  Temp(Src) 97.8 F (36.6 C) (Oral)  Resp 16  Wt 188 lb (85.276 kg)  SpO2 92%   Subjective:    Patient ID: Zachary Wallace, male    DOB: [birth year], 60 y.o.   MRN: [id number]  HPI: Zachary Wallace is a 60 y.o. male  Chief Complaint  Patient presents with  . Medication Refill  . Rash    right side of neck onset 2 months   Patient is brand new to me; his usual provider is out of the office  Dermatitis; side of the right neck; been there about 2 months; not really itching; has not tried anything on it  Type 2 diabetes; checks sugars every now and then; no dry mouth; no blurred vision; no sores on feet; on glimepiride  HTN; not checking BP at home; on multiple medicines  ED; viagra works well; needs refill  High cholesterol; on statin; eats processed meats; not very many eggs a week; does eat cheese; not much milk  Smoking; smokes 1 ppd, now is not a good time to quit he says; he has tried to quit, didn't work  COPD; inhalers working well  Back problems; takes hydrocodone occasionally; does not need refill today  "Epilepsy" in problem list; he says he had a single seizure in 2012; nothing since; not on medicine  Depression screen [facility] 2/9 08/16/2015 04/01/2015 02/28/2015 02/10/2015 01/10/2015  Decreased Interest 0 0 0 0 0  Down, Depressed, Hopeless 0 0 0 0 0  PHQ - 2 Score 0 0 0 0 0   Relevant past medical, surgical, family and social history reviewed Past Medical History  Diagnosis Date  . COPD (chronic obstructive pulmonary disease) ([place])   . Hyperlipidemia   . Hypertension   . Diabetes mellitus without complication [facility])    Past Surgical History  Procedure Laterality Date  . Hand surgery Left   . Knee surgery Left    Family History  Problem Relation Age of Onset  . Clotting disorder Mother   . Diabetes Mother   . Hypertension Mother   MD notes/addition: Mother had HTN, diabetes, deceased  Social History  Substance Use Topics  . Smoking  status: Current Every Day Smoker  . Smokeless tobacco: None  . Alcohol Use: No   Interim medical history since last visit reviewed. Allergies and medications reviewed  Review of Systems Per HPI unless specifically indicated above     Objective:    BP 106/72 mmHg  Pulse 59  Temp(Src) 97.8 F (36.6 C) (Oral)  Resp 16  Wt 188 lb (85.276 kg)  SpO2 92%  Wt Readings from Last 3 Encounters:  08/16/15 188 lb (85.276 kg)  04/01/15 190 lb 9.6 oz (86.456 kg)  02/28/15 189 lb 6.4 oz (85.911 kg)    Physical Exam  Constitutional: He appears well-developed and well-nourished. No distress.  HENT:  Head: Normocephalic and atraumatic.  Right Ear: External ear normal.  Left Ear: External ear normal.  Mouth/Throat: Oropharynx is clear and moist.  Eyes: EOM are normal. Right eye exhibits no discharge. Left eye exhibits no discharge. No scleral icterus.  Neck: No JVD present.  Cardiovascular: Normal rate and regular rhythm.   Pulmonary/Chest: Effort normal and breath sounds normal.  Occasional cough; no wheezes  Abdominal: Bowel sounds are normal. He exhibits no distension.  Musculoskeletal: Normal range of motion. He exhibits no edema.  Lymphadenopathy:    He has no cervical adenopathy.  Neurological: He is alert.  Skin: Skin is warm and dry. Rash noted. He is not diaphoretic. No pallor.  Linear hyperpigmented lesions on teh side of the neck, right side >>> left; no vesicles; no pits in the nails  Psychiatric: He has a normal mood and affect.   Results for orders placed or performed in visit on 01/10/15  POCT Glucose (CBG)  Result Value Ref Range   POC Glucose 174 (A) 70 - 99 mg/dl  POCT HgB A1C  Result Value Ref Range   Hemoglobin A1C 6.5   POCT UA - Microalbumin  Result Value Ref Range   Microalbumin Ur, POC 20 mg/L   Creatinine, POC  mg/dL   Albumin/Creatinine Ratio, Urine, POC        Assessment & Plan:   Problem List Items Addressed This Visit      Cardiovascular and  Mediastinum   BP (high blood pressure)    Try DASH guidelines; decrease chlorthalidone; check BP and call me if over 130 on top      Relevant Medications   sildenafil (VIAGRA) 100 MG tablet   chlorthalidone (HYGROTON) 25 MG tablet     Respiratory   COPD (chronic obstructive pulmonary disease) ([facility])    contnue inhalers; please do try to quit, I am here to help        Endocrine   Diabetes mellitus type 2 in nonobese [facility]) - Primary    Check A1c today, foot exam by MD; eye exams yearly      Relevant Orders   Hemoglobin A1c   Lipid Panel w/o Chol/HDL Ratio   Microalbumin / creatinine urine ratio     Nervous and Auditory   Epilepsy ([place])    Patient had a single seizure in 2012; nothing since then; not on medicine; not sure what this is about, not focus of today's visit      Lumbar radiculopathy    Does not need refills of the hydrocodone today he says        Genitourinary   Chronic kidney disease (CKD), stage III (moderate)    Check Cr; avoid NSAIDs        Other   Major depressive disorder with single episode, in full remission ([place])    On no medicine; mood is doing well      Medication monitoring encounter   Relevant Orders   Comprehensive metabolic panel    Other Visit Diagnoses    Dermatitis        will use topical corticosteroid; does not look fungal; unusual appearance though; if not improving, pt may call and I can refer to derm       Follow up plan: Return in about 3 months (around 11/16/2015) for follow-up.  An after-visit summary was printed and given to the patient at [place].  Please see the patient instructions which may contain other information and recommendations beyond what is mentioned above in the assessment and plan.  Meds ordered this encounter  Medications  . triamcinolone ointment (KENALOG) 0.5 %    Sig: Apply 1 application topically 2 (two) times daily. (too strong for face, underarms, groin)    Dispense:  15 g    Refill:  0  .  sildenafil (VIAGRA) 100 MG tablet    Sig: Take 1 tablet (100 mg total) by mouth daily.    Dispense:  15 tablet    Refill:  11  . chlorthalidone (HYGROTON) 25 MG tablet    Sig: Take 0.5 tablets (12.5 mg total) by mouth daily.  Dispense:  15 tablet    Refill:  5    New instructions    Orders Placed This Encounter  Procedures  . Hemoglobin A1c  . Comprehensive metabolic panel  . Lipid Panel w/o Chol/HDL Ratio  . Microalbumin / creatinine urine ratio

## 2015-08-16 NOTE — Assessment & Plan Note (Signed)
Check Cr; avoid NSAIDs 

## 2015-08-16 NOTE — Patient Instructions (Addendum)
Please decrease the chlorthalidone from one whole pill daily to just one-half pill daily Check your blood pressure a few times a week over the next few weeks Call me if top number is going up over 130  Try to limit saturated fats in your diet (bologna, hot dogs, barbeque, cheeseburgers, hamburgers, steak, bacon, sausage, cheese, etc.) and get more fresh fruits, vegetables, and whole grains  I do encourage you to quit smoking Call [phone] to sign up for smoking cessation classes You can call 1-800-QUIT-NOW to talk with a smoking cessation coach  Please do see your eye doctor regularly, and have your eyes examined every year (or more often per his or her recommendation) Check your feet every night and let me know right away of any sores, infections, numbness, etc. Try to limit sweets, white bread, white rice, white potatoes It is okay with me for you to not check your fingerstick blood sugars (per [employer] of Endocrinology Best Practices), unless you are interested and feel it would be helpful for you   DASH Eating Plan DASH stands for "Dietary Approaches to Stop Hypertension." The DASH eating plan is a healthy eating plan that has been shown to reduce high blood pressure (hypertension). Additional health benefits may include reducing the risk of type 2 diabetes mellitus, heart disease, and stroke. The DASH eating plan may also help with weight loss. WHAT DO I NEED TO KNOW ABOUT THE DASH EATING PLAN? For the DASH eating plan, you will follow these general guidelines:  Choose foods with a percent daily value for sodium of less than 5% (as listed on the food label).  Use salt-free seasonings or herbs instead of table salt or sea salt.  Check with your health care provider or pharmacist before using salt substitutes.  Eat lower-sodium products, often labeled as "lower sodium" or "no salt added."  Eat fresh foods.  Eat more vegetables, fruits, and low-fat dairy  products.  Choose whole grains. Look for the word "whole" as the first word in the ingredient list.  Choose fish and skinless chicken or [place] more often than red meat. Limit fish, poultry, and meat to 6 oz (170 g) each day.  Limit sweets, desserts, sugars, and sugary drinks.  Choose heart-healthy fats.  Limit cheese to 1 oz (28 g) per day.  Eat more home-cooked food and less restaurant, buffet, and fast food.  Limit fried foods.  Cook foods using methods other than frying.  Limit canned vegetables. If you do use them, rinse them well to decrease the sodium.  When eating at a restaurant, ask that your food be prepared with less salt, or no salt if possible. WHAT FOODS CAN I EAT? Seek help from a dietitian for individual calorie needs. Grains Whole grain or whole wheat bread. Brown rice. Whole grain or whole wheat pasta. Quinoa, bulgur, and whole grain cereals. Low-sodium cereals. Corn or whole wheat flour tortillas. Whole grain cornbread. Whole grain crackers. Low-sodium crackers. Vegetables Fresh or frozen vegetables (raw, steamed, roasted, or grilled). Low-sodium or reduced-sodium tomato and vegetable juices. Low-sodium or reduced-sodium tomato sauce and paste. Low-sodium or reduced-sodium canned vegetables.  Fruits All fresh, canned (in natural juice), or frozen fruits. Meat and Other Protein Products Ground beef (85% or leaner), grass-fed beef, or beef trimmed of fat. Skinless chicken or [place]. Ground chicken or [place]. Pork trimmed of fat. All fish and seafood. Eggs. Dried beans, peas, or lentils. Unsalted nuts and seeds. Unsalted canned beans. Dairy Low-fat dairy products, such as skim  or 1% milk, 2% or reduced-fat cheeses, low-fat ricotta or cottage cheese, or plain low-fat yogurt. Low-sodium or reduced-sodium cheeses. Fats and Oils Tub margarines without trans fats. Light or reduced-fat mayonnaise and salad dressings (reduced sodium). Avocado. Safflower, olive, or canola  oils. Natural peanut or almond butter. Other Unsalted popcorn and pretzels. The items listed above may not be a complete list of recommended foods or beverages. Contact your dietitian for more options. WHAT FOODS ARE NOT RECOMMENDED? Grains White bread. White pasta. White rice. Refined cornbread. Bagels and croissants. Crackers that contain trans fat. Vegetables Creamed or fried vegetables. Vegetables in a cheese sauce. Regular canned vegetables. Regular canned tomato sauce and paste. Regular tomato and vegetable juices. Fruits Dried fruits. Canned fruit in light or heavy syrup. Fruit juice. Meat and Other Protein Products Fatty cuts of meat. Ribs, chicken wings, bacon, sausage, bologna, salami, chitterlings, fatback, hot dogs, bratwurst, and packaged luncheon meats. Salted nuts and seeds. Canned beans with salt. Dairy Whole or 2% milk, cream, half-and-half, and cream cheese. Whole-fat or sweetened yogurt. Full-fat cheeses or blue cheese. Nondairy creamers and whipped toppings. Processed cheese, cheese spreads, or cheese curds. Condiments Onion and garlic salt, seasoned salt, table salt, and sea salt. Canned and packaged gravies. Worcestershire sauce. Tartar sauce. Barbecue sauce. Teriyaki sauce. Soy sauce, including reduced sodium. Steak sauce. Fish sauce. Oyster sauce. Cocktail sauce. Horseradish. Ketchup and mustard. Meat flavorings and tenderizers. Bouillon cubes. Hot sauce. Tabasco sauce. Marinades. Taco seasonings. Relishes. Fats and Oils Butter, stick margarine, lard, shortening, ghee, and bacon fat. Coconut, palm kernel, or palm oils. Regular salad dressings. Other Pickles and olives. Salted popcorn and pretzels. The items listed above may not be a complete list of foods and beverages to avoid. Contact your dietitian for more information. WHERE CAN I FIND MORE INFORMATION? National Heart, Lung, and Blood Institute: travelstabloid.com   This  information is not intended to replace advice given to you by your health care provider. Make sure you discuss any questions you have with your health care provider.   Document Released: 03/01/2011 Document Revised: 04/02/2014 Document Reviewed: 01/14/2013 Elsevier Interactive Patient Education 2016 [place] Can Quit Smoking If you are ready to quit smoking or are thinking about it, congratulations! You have chosen to help yourself be healthier and live longer! There are lots of different ways to quit smoking. Nicotine gum, nicotine patches, a nicotine inhaler, or nicotine nasal spray can help with physical craving. Hypnosis, support groups, and medicines help break the habit of smoking. TIPS TO GET OFF AND STAY OFF CIGARETTES  Learn to predict your moods. Do not let a bad situation be your excuse to have a cigarette. Some situations in your life might tempt you to have a cigarette.  Ask friends and co-workers not to smoke around you.  Make your home smoke-free.  Never have "just one" cigarette. It leads to wanting another and another. Remind yourself of your decision to quit.  On a card, make a list of your reasons for not smoking. Read it at least the same number of times a day as you have a cigarette. Tell yourself everyday, "I do not want to smoke. I choose not to smoke."  Ask someone at home or work to help you with your plan to quit smoking.  Have something planned after you eat or have a cup of coffee. Take a walk or get other exercise to perk you up. This will help to keep you from overeating.  Try a relaxation exercise  to calm you down and decrease your stress. Remember, you may be tense and nervous the first two weeks after you quit. This will pass.  Find new activities to keep your hands busy. Play with a pen, coin, or rubber band. Doodle or draw things on paper.  Brush your teeth right after eating. This will help cut down the craving for the taste of tobacco after  meals. You can try mouthwash too.  Try gum, breath mints, or diet candy to keep something in your mouth. IF YOU SMOKE AND WANT TO QUIT:  Do not stock up on cigarettes. Never buy a carton. Wait until one pack is finished before you buy another.  Never carry cigarettes with you at work or at home.  Keep cigarettes as far away from you as possible. Leave them with someone else.  Never carry matches or a lighter with you.  Ask yourself, "Do I need this cigarette or is this just a reflex?"  Bet with someone that you can quit. Put cigarette money in a piggy bank every morning. If you smoke, you give up the money. If you do not smoke, by the end of the week, you keep the money.  Keep trying. It takes 21 days to change a habit!  Talk to your doctor about using medicines to help you quit. These include nicotine replacement gum, lozenges, or skin patches.   This information is not intended to replace advice given to you by your health care provider. Make sure you discuss any questions you have with your health care provider.   Document Released: 01/06/2009 Document Revised: 06/04/2011 Document Reviewed: 01/06/2009 Elsevier Interactive Patient Education [employer].

## 2015-08-19 ENCOUNTER — Other Ambulatory Visit: Payer: Self-pay | Admitting: Family Medicine

## 2015-09-12 DIAGNOSIS — N183 Chronic kidney disease, stage 3 (moderate): Secondary | ICD-10-CM | POA: Diagnosis not present

## 2015-09-17 ENCOUNTER — Other Ambulatory Visit: Payer: Self-pay | Admitting: Family Medicine

## 2015-09-21 DIAGNOSIS — E782 Mixed hyperlipidemia: Secondary | ICD-10-CM | POA: Diagnosis not present

## 2015-09-21 DIAGNOSIS — Z720 Tobacco use: Secondary | ICD-10-CM | POA: Diagnosis not present

## 2015-09-21 DIAGNOSIS — D631 Anemia in chronic kidney disease: Secondary | ICD-10-CM | POA: Diagnosis not present

## 2015-09-21 DIAGNOSIS — Z6828 Body mass index (BMI) 28.0-28.9, adult: Secondary | ICD-10-CM | POA: Diagnosis not present

## 2015-09-21 DIAGNOSIS — E1122 Type 2 diabetes mellitus with diabetic chronic kidney disease: Secondary | ICD-10-CM | POA: Diagnosis not present

## 2015-09-21 DIAGNOSIS — E559 Vitamin D deficiency, unspecified: Secondary | ICD-10-CM | POA: Diagnosis not present

## 2015-09-21 DIAGNOSIS — N189 Chronic kidney disease, unspecified: Secondary | ICD-10-CM | POA: Diagnosis not present

## 2015-09-21 DIAGNOSIS — N183 Chronic kidney disease, stage 3 (moderate): Secondary | ICD-10-CM | POA: Diagnosis not present

## 2015-09-21 DIAGNOSIS — R809 Proteinuria, unspecified: Secondary | ICD-10-CM | POA: Diagnosis not present

## 2015-09-21 DIAGNOSIS — I10 Essential (primary) hypertension: Secondary | ICD-10-CM | POA: Diagnosis not present

## 2015-09-23 ENCOUNTER — Telehealth: Payer: Self-pay | Admitting: Family Medicine

## 2015-09-23 MED ORDER — TIOTROPIUM BROMIDE MONOHYDRATE 18 MCG IN CAPS: 18.0000 ug | ORAL_CAPSULE | Freq: Every day | RESPIRATORY_TRACT | Status: DC

## 2015-09-23 NOTE — Telephone Encounter (Signed)
Pt needs refill on Spiriva and Inhaler to be sent to [facility]. Pt is out and needs it for the weekend.

## 2015-09-24 ENCOUNTER — Other Ambulatory Visit: Payer: Self-pay | Admitting: Family Medicine

## 2015-10-10 ENCOUNTER — Other Ambulatory Visit: Payer: Self-pay | Admitting: Family Medicine

## 2015-10-18 ENCOUNTER — Telehealth: Payer: Self-pay | Admitting: Family Medicine

## 2015-11-08 NOTE — Telephone Encounter (Signed)
COMPLETED

## 2015-11-14 ENCOUNTER — Encounter: Payer: Self-pay | Admitting: Family Medicine

## 2015-11-14 ENCOUNTER — Ambulatory Visit (INDEPENDENT_AMBULATORY_CARE_PROVIDER_SITE_OTHER): Payer: Medicare Other | Admitting: Family Medicine

## 2015-11-14 ENCOUNTER — Ambulatory Visit: Payer: Medicare Other | Admitting: Family Medicine

## 2015-11-14 DIAGNOSIS — J42 Unspecified chronic bronchitis: Secondary | ICD-10-CM

## 2015-11-14 DIAGNOSIS — I10 Essential (primary) hypertension: Secondary | ICD-10-CM | POA: Diagnosis not present

## 2015-11-14 DIAGNOSIS — Z5181 Encounter for therapeutic drug level monitoring: Secondary | ICD-10-CM

## 2015-11-14 DIAGNOSIS — R809 Proteinuria, unspecified: Secondary | ICD-10-CM | POA: Diagnosis not present

## 2015-11-14 DIAGNOSIS — N183 Chronic kidney disease, stage 3 unspecified: Secondary | ICD-10-CM

## 2015-11-14 DIAGNOSIS — E119 Type 2 diabetes mellitus without complications: Secondary | ICD-10-CM

## 2015-11-14 LAB — COMPREHENSIVE METABOLIC PANEL
ALBUMIN: 4.6 g/dL (ref 3.6–5.1)
ALK PHOS: 105 U/L (ref 40–115)
ALT: 15 U/L (ref 9–46)
AST: 20 U/L (ref 10–35)
BILIRUBIN TOTAL: 0.3 mg/dL (ref 0.2–1.2)
BUN: 15 mg/dL (ref 7–25)
CALCIUM: 9.7 mg/dL (ref 8.6–10.3)
CHLORIDE: 99 mmol/L (ref 98–110)
CO2: 32 mmol/L — ABNORMAL HIGH (ref 20–31)
Creat: 1.98 mg/dL — ABNORMAL HIGH (ref 0.70–1.33)
Glucose, Bld: 229 mg/dL — ABNORMAL HIGH (ref 65–99)
POTASSIUM: 4.2 mmol/L (ref 3.5–5.3)
Sodium: 137 mmol/L (ref 135–146)
TOTAL PROTEIN: 7.6 g/dL (ref 6.1–8.1)

## 2015-11-14 LAB — LIPID PANEL
Cholesterol: 98 mg/dL — ABNORMAL LOW (ref 125–200)
HDL: 36 mg/dL — ABNORMAL LOW (ref >=40)
LDL CALC: 8 mg/dL (ref <130)
TRIGLYCERIDES: 270 mg/dL — AB (ref <150)
Total CHOL/HDL Ratio: 2.7 Ratio (ref <=5.0)
VLDL: 54 mg/dL — AB (ref <30)

## 2015-11-14 MED ORDER — ALBUTEROL SULFATE HFA 108 (90 BASE) MCG/ACT IN AERS: 2.0000 | INHALATION_SPRAY | RESPIRATORY_TRACT | 2 refills | Status: DC | PRN

## 2015-11-14 MED ORDER — TIOTROPIUM BROMIDE MONOHYDRATE 18 MCG IN CAPS: 18.0000 ug | ORAL_CAPSULE | Freq: Every day | RESPIRATORY_TRACT | 11 refills | Status: DC

## 2015-11-14 NOTE — Assessment & Plan Note (Signed)
Monitor sgpt on statin 

## 2015-11-14 NOTE — Assessment & Plan Note (Signed)
Sees nephrologist; avoid NSAIDs; control sugars

## 2015-11-14 NOTE — Progress Notes (Signed)
BP 112/72   Pulse 61   Temp 97.2 F (36.2 C) (Oral)   Resp 14   Wt 190 lb (86.2 kg)   SpO2 98%   BMI 28.89 kg/m    Subjective:    Patient ID: Zachary Wallace, male    DOB: Mar 25, 1956, 60 y.o.   MRN: [id number]  HPI: Zachary Wallace is a 60 y.o. male  Chief Complaint  Patient presents with  . Follow-up    3 month   Patient is here for f/u Type 2 diabetes; overdue for eye exam; no problems with feet; not checking sugars with my blessing; some dry mouth; no blurred vision; tries to limit simple carbs  Sees kidney doctor; CKD, sees [facility] for that; avoiding NSAIDs; good water drinker  Tobacco abuse; about the same; knows smoking is bad for him; not quite ready to quit  High cholesterol; not fasting; had soda this morning, no food  HTN; well-controlled; does add a little salt to food  He has COPD; needs refills of Spiriva and Proair  Depression screen [facility] 2/9 11/14/2015 08/16/2015 04/01/2015 02/28/2015 02/10/2015  Decreased Interest 0 0 0 0 0  Down, Depressed, Hopeless 0 0 0 0 0  PHQ - 2 Score 0 0 0 0 0   Relevant past medical, surgical, family and social history reviewed Past Medical History:  Diagnosis Date  . COPD (chronic obstructive pulmonary disease) ([place])   . Diabetes mellitus without complication ([place])   . Hyperlipidemia   . Hypertension    Past Surgical History:  Procedure Laterality Date  . HAND SURGERY Left   . KNEE SURGERY Left    Family History  Problem Relation Age of Onset  . Clotting disorder Mother   . Diabetes Mother   . Hypertension Mother    Social History  Substance Use Topics  . Smoking status: Current Every Day Smoker  . Smokeless tobacco: Not on file  . Alcohol use No   Interim medical history since last visit reviewed. Allergies and medications reviewed  Review of Systems Per HPI unless specifically indicated above     Objective:    BP 112/72   Pulse 61   Temp 97.2 F (36.2 C) (Oral)   Resp 14   Wt 190 lb (86.2 kg)   SpO2 98%    BMI 28.89 kg/m   Wt Readings from Last 3 Encounters:  11/14/15 190 lb (86.2 kg)  08/16/15 188 lb (85.3 kg)  04/01/15 190 lb 9.6 oz (86.5 kg)    Physical Exam  Constitutional: He appears well-developed and well-nourished. No distress.  HENT:  Head: Normocephalic and atraumatic.  Right Ear: External ear normal.  Left Ear: External ear normal.  Mouth/Throat: Oropharynx is clear and moist.  Eyes: EOM are normal. Right eye exhibits no discharge. Left eye exhibits no discharge. No scleral icterus.  Neck: No JVD present.  Cardiovascular: Normal rate and regular rhythm.   Pulmonary/Chest: Effort normal and breath sounds normal. He has no wheezes.  Abdominal: Bowel sounds are normal. He exhibits no distension.  Musculoskeletal: Normal range of motion. He exhibits no edema.  Lymphadenopathy:    He has no cervical adenopathy.  Neurological: He is alert.  Skin: Skin is warm and dry. He is not diaphoretic. No pallor.  Psychiatric: He has a normal mood and affect.   Diabetic Foot Form - Detailed   Diabetic Foot Exam - detailed Diabetic Foot exam was performed with the following findings:  Yes 11/14/2015  9:19 AM  Visual  Foot Exam completed.:  Yes  Are the toenails ingrown?:  No Normal Range of Motion:  Yes Pulse Foot Exam completed.:  Yes  Right Dorsalis Pedis:  Present Left Dorsalis Pedis:  Present  Sensory Foot Exam Completed.:  Yes Swelling:  No Semmes-Weinstein Monofilament Test R Site 1-Great Toe:  Pos L Site 1-Great Toe:  Pos  R Site 4:  Pos L Site 4:  Pos  R Site 5:  Pos L Site 5:  Pos       Results for orders placed or performed in visit on 01/10/15  POCT Glucose (CBG)  Result Value Ref Range   POC Glucose 174 (A) 70 - 99 mg/dl  POCT HgB A1C  Result Value Ref Range   Hemoglobin A1C 6.5   POCT UA - Microalbumin  Result Value Ref Range   Microalbumin Ur, POC 20 mg/L   Creatinine, POC  mg/dL   Albumin/Creatinine Ratio, Urine, POC        Assessment & Plan:   Problem  List Items Addressed This Visit      Cardiovascular and Mediastinum   BP (high blood pressure)    Well-controlled; try DASH guidelines        Respiratory   COPD (chronic obstructive pulmonary disease) (HCC)    Controlled on medicine, refills given, I am here to help if/when he's ready to quit smoking      Relevant Medications   tiotropium (SPIRIVA HANDIHALER) 18 MCG inhalation capsule   albuterol (PROAIR HFA) 108 (90 Base) MCG/ACT inhaler     Endocrine   Diabetes mellitus type 2 in nonobese (HCC)    Check A1c today, foot exam UTD: patient will call eye doctor to schedule appt      Relevant Orders   Hemoglobin A1c   Lipid panel     Genitourinary   Chronic kidney disease (CKD), stage III (moderate)    Seeing nephrologist; avoid NSAIDs; check Cr        Other   Medication monitoring encounter    Monitor sgpt on statin      Relevant Orders   Comprehensive metabolic panel   Abnormal presence of protein in urine    Sees nephrologist; avoid NSAIDs; control sugars       Other Visit Diagnoses   None.     Follow up plan: Return in about 6 months (around 05/16/2016) for follow-up if diabetes controlled; 3 months if not.  An after-visit summary was printed and given to the patient at [facility].  Please see the patient instructions which may contain other information and recommendations beyond what is mentioned above in the assessment and plan.  Meds ordered this encounter  Medications  . tiotropium (SPIRIVA HANDIHALER) 18 MCG inhalation capsule    Sig: Place 1 capsule (18 mcg total) into inhaler and inhale daily.    Dispense:  30 capsule    Refill:  11  . albuterol (PROAIR HFA) 108 (90 Base) MCG/ACT inhaler    Sig: Inhale 2 puffs into the lungs every 4 (four) hours as needed for wheezing or shortness of breath.    Dispense:  8.5 g    Refill:  2    Orders Placed This Encounter  Procedures  . Comprehensive metabolic panel  . Hemoglobin A1c  . Lipid panel

## 2015-11-14 NOTE — Assessment & Plan Note (Signed)
Controlled on medicine, refills given, I am here to help if/when he's ready to quit smoking

## 2015-11-14 NOTE — Assessment & Plan Note (Signed)
Seeing nephrologist; avoid NSAIDs; check Cr

## 2015-11-14 NOTE — Assessment & Plan Note (Signed)
Well-controlled; try DASH guidelines 

## 2015-11-14 NOTE — Assessment & Plan Note (Signed)
Check A1c today, foot exam UTD: patient will call eye doctor to schedule appt

## 2015-11-14 NOTE — Patient Instructions (Addendum)
We'll check labs today Avoid salt substitutes  I do encourage you to quit smoking Call [phone] to sign up for smoking cessation classes You can call 1-800-QUIT-NOW to talk with a smoking cessation coach  Please do see your eye doctor regularly, and have your eyes examined every year (or more often per his or her recommendation) Check your feet every night and let me know right away of any sores, infections, numbness, etc. Try to limit sweets, white bread, white rice, white potatoes It is okay with me for you to not check your fingerstick blood sugars (per [employer] of Endocrinology Best Practices), unless you are interested and feel it would be helpful for you  Try DASH guidelines  DASH Eating Plan DASH stands for "Dietary Approaches to Stop Hypertension." The DASH eating plan is a healthy eating plan that has been shown to reduce high blood pressure (hypertension). Additional health benefits may include reducing the risk of type 2 diabetes mellitus, heart disease, and stroke. The DASH eating plan may also help with weight loss. WHAT DO I NEED TO KNOW ABOUT THE DASH EATING PLAN? For the DASH eating plan, you will follow these general guidelines:  Choose foods with a percent daily value for sodium of less than 5% (as listed on the food label).  Use salt-free seasonings or herbs instead of table salt or sea salt.  Check with your health care provider or pharmacist before using salt substitutes.  Eat lower-sodium products, often labeled as "lower sodium" or "no salt added."  Eat fresh foods.  Eat more vegetables, fruits, and low-fat dairy products.  Choose whole grains. Look for the word "whole" as the first word in the ingredient list.  Choose fish and skinless chicken or [place] more often than red meat. Limit fish, poultry, and meat to 6 oz (170 g) each day.  Limit sweets, desserts, sugars, and sugary drinks.  Choose heart-healthy fats.  Limit cheese to 1 oz (28 g)  per day.  Eat more home-cooked food and less restaurant, buffet, and fast food.  Limit fried foods.  Cook foods using methods other than frying.  Limit canned vegetables. If you do use them, rinse them well to decrease the sodium.  When eating at a restaurant, ask that your food be prepared with less salt, or no salt if possible. WHAT FOODS CAN I EAT? Seek help from a dietitian for individual calorie needs. Grains Whole grain or whole wheat bread. Brown rice. Whole grain or whole wheat pasta. Quinoa, bulgur, and whole grain cereals. Low-sodium cereals. Corn or whole wheat flour tortillas. Whole grain cornbread. Whole grain crackers. Low-sodium crackers. Vegetables Fresh or frozen vegetables (raw, steamed, roasted, or grilled). Low-sodium or reduced-sodium tomato and vegetable juices. Low-sodium or reduced-sodium tomato sauce and paste. Low-sodium or reduced-sodium canned vegetables.  Fruits All fresh, canned (in natural juice), or frozen fruits. Meat and Other Protein Products Ground beef (85% or leaner), grass-fed beef, or beef trimmed of fat. Skinless chicken or [place]. Ground chicken or [place]. Pork trimmed of fat. All fish and seafood. Eggs. Dried beans, peas, or lentils. Unsalted nuts and seeds. Unsalted canned beans. Dairy Low-fat dairy products, such as skim or 1% milk, 2% or reduced-fat cheeses, low-fat ricotta or cottage cheese, or plain low-fat yogurt. Low-sodium or reduced-sodium cheeses. Fats and Oils Tub margarines without trans fats. Light or reduced-fat mayonnaise and salad dressings (reduced sodium). Avocado. Safflower, olive, or canola oils. Natural peanut or almond butter. Other Unsalted popcorn and pretzels. The items listed above  may not be a complete list of recommended foods or beverages. Contact your dietitian for more options. WHAT FOODS ARE NOT RECOMMENDED? Grains White bread. White pasta. White rice. Refined cornbread. Bagels and croissants. Crackers that  contain trans fat. Vegetables Creamed or fried vegetables. Vegetables in a cheese sauce. Regular canned vegetables. Regular canned tomato sauce and paste. Regular tomato and vegetable juices. Fruits Dried fruits. Canned fruit in light or heavy syrup. Fruit juice. Meat and Other Protein Products Fatty cuts of meat. Ribs, chicken wings, bacon, sausage, bologna, salami, chitterlings, fatback, hot dogs, bratwurst, and packaged luncheon meats. Salted nuts and seeds. Canned beans with salt. Dairy Whole or 2% milk, cream, half-and-half, and cream cheese. Whole-fat or sweetened yogurt. Full-fat cheeses or blue cheese. Nondairy creamers and whipped toppings. Processed cheese, cheese spreads, or cheese curds. Condiments Onion and garlic salt, seasoned salt, table salt, and sea salt. Canned and packaged gravies. Worcestershire sauce. Tartar sauce. Barbecue sauce. Teriyaki sauce. Soy sauce, including reduced sodium. Steak sauce. Fish sauce. Oyster sauce. Cocktail sauce. Horseradish. Ketchup and mustard. Meat flavorings and tenderizers. Bouillon cubes. Hot sauce. Tabasco sauce. Marinades. Taco seasonings. Relishes. Fats and Oils Butter, stick margarine, lard, shortening, ghee, and bacon fat. Coconut, palm kernel, or palm oils. Regular salad dressings. Other Pickles and olives. Salted popcorn and pretzels. The items listed above may not be a complete list of foods and beverages to avoid. Contact your dietitian for more information. WHERE CAN I FIND MORE INFORMATION? National Heart, Lung, and Blood Institute: travelstabloid.com   This information is not intended to replace advice given to you by your health care provider. Make sure you discuss any questions you have with your health care provider.   Document Released: 03/01/2011 Document Revised: 04/02/2014 Document Reviewed: 01/14/2013 Elsevier Interactive Patient Education [employer].

## 2015-11-15 ENCOUNTER — Telehealth: Payer: Self-pay | Admitting: Family Medicine

## 2015-11-15 LAB — HEMOGLOBIN A1C
HEMOGLOBIN A1C: 6.9 % — AB (ref <5.7)
Mean Plasma Glucose: 151 mg/dL

## 2015-11-15 MED ORDER — ATORVASTATIN CALCIUM 20 MG PO TABS: 20.0000 mg | ORAL_TABLET | Freq: Every day | ORAL | 1 refills | Status: DC

## 2015-11-15 NOTE — Telephone Encounter (Signed)
Please let pt know that his kidney function has gotten a little worse since his [facility] doctor checked it in June; please send a copy of the labs to [facility] kidney specialist Avoid NSAIDs and stay well-hydrated His LDL is only 8, so we're going to decrease his atorvastatin from 40 mg at night to 20 mg at night His TG are elevated, so start krill oil or fish oil twice a day and avoid fried foods, sugary drinks, white bread His A1c still shows control, 6.9 and our goal is less than 7

## 2015-11-15 NOTE — Telephone Encounter (Signed)
Left voice mail

## 2015-12-01 NOTE — Telephone Encounter (Signed)
Please send copy of labs to [facility]; try to reach patient again; thank you

## 2015-12-02 NOTE — Telephone Encounter (Signed)
I'm printing out labs and have put fax number at bottom; in fax tray for staff to fax on Monday; nephrology added to care team

## 2015-12-02 NOTE — Telephone Encounter (Signed)
Left detailed voicemail

## 2015-12-22 ENCOUNTER — Other Ambulatory Visit: Payer: Self-pay | Admitting: Family Medicine

## 2016-01-17 ENCOUNTER — Encounter: Payer: Self-pay | Admitting: Family Medicine

## 2016-03-27 ENCOUNTER — Other Ambulatory Visit: Payer: Self-pay | Admitting: Family Medicine

## 2016-03-28 NOTE — Telephone Encounter (Signed)
Glitch in the system; I was not getting electronic refill requests from Dec 13th until yesterday; addressed with IT; handling refill requests now --------------------- Reviewed last visit, last labs; next appt 2/21 rx approved

## 2016-05-16 ENCOUNTER — Ambulatory Visit (INDEPENDENT_AMBULATORY_CARE_PROVIDER_SITE_OTHER): Payer: Medicare Other | Admitting: Family Medicine

## 2016-05-16 ENCOUNTER — Encounter: Payer: Self-pay | Admitting: Family Medicine

## 2016-05-16 VITALS — BP 124/82 | HR 82 | Temp 97.8°F | Resp 16 | Wt 184.8 lb

## 2016-05-16 DIAGNOSIS — E559 Vitamin D deficiency, unspecified: Secondary | ICD-10-CM

## 2016-05-16 DIAGNOSIS — R351 Nocturia: Secondary | ICD-10-CM | POA: Diagnosis not present

## 2016-05-16 DIAGNOSIS — Z720 Tobacco use: Secondary | ICD-10-CM | POA: Diagnosis not present

## 2016-05-16 DIAGNOSIS — Z125 Encounter for screening for malignant neoplasm of prostate: Secondary | ICD-10-CM

## 2016-05-16 DIAGNOSIS — E781 Pure hyperglyceridemia: Secondary | ICD-10-CM | POA: Insufficient documentation

## 2016-05-16 DIAGNOSIS — G40909 Epilepsy, unspecified, not intractable, without status epilepticus: Secondary | ICD-10-CM

## 2016-05-16 DIAGNOSIS — N183 Chronic kidney disease, stage 3 unspecified: Secondary | ICD-10-CM

## 2016-05-16 DIAGNOSIS — Z1211 Encounter for screening for malignant neoplasm of colon: Secondary | ICD-10-CM | POA: Diagnosis not present

## 2016-05-16 DIAGNOSIS — E119 Type 2 diabetes mellitus without complications: Secondary | ICD-10-CM

## 2016-05-16 DIAGNOSIS — Z5181 Encounter for therapeutic drug level monitoring: Secondary | ICD-10-CM

## 2016-05-16 DIAGNOSIS — R809 Proteinuria, unspecified: Secondary | ICD-10-CM | POA: Diagnosis not present

## 2016-05-16 DIAGNOSIS — F17210 Nicotine dependence, cigarettes, uncomplicated: Secondary | ICD-10-CM | POA: Insufficient documentation

## 2016-05-16 DIAGNOSIS — I10 Essential (primary) hypertension: Secondary | ICD-10-CM | POA: Diagnosis not present

## 2016-05-16 LAB — HEMOCCULT GUIAC POC 1CARD (OFFICE): FECAL OCCULT BLD: NEGATIVE

## 2016-05-16 LAB — HEMOGLOBIN A1C
Hgb A1c MFr Bld: 9.5 % — ABNORMAL HIGH (ref <5.7)
Mean Plasma Glucose: 226 mg/dL

## 2016-05-16 LAB — LIPID PANEL
Cholesterol: 102 mg/dL (ref <200)
HDL: 31 mg/dL — AB (ref >40)
LDL CALC: 22 mg/dL (ref <100)
Total CHOL/HDL Ratio: 3.3 Ratio (ref <5.0)
Triglycerides: 244 mg/dL — ABNORMAL HIGH (ref <150)
VLDL: 49 mg/dL — ABNORMAL HIGH (ref <30)

## 2016-05-16 LAB — COMPLETE METABOLIC PANEL WITH GFR
ALT: 14 U/L (ref 9–46)
AST: 16 U/L (ref 10–35)
Albumin: 4.3 g/dL (ref 3.6–5.1)
Alkaline Phosphatase: 104 U/L (ref 40–115)
BUN: 19 mg/dL (ref 7–25)
CALCIUM: 9.3 mg/dL (ref 8.6–10.3)
CHLORIDE: 99 mmol/L (ref 98–110)
CO2: 29 mmol/L (ref 20–31)
Creat: 2.15 mg/dL — ABNORMAL HIGH (ref 0.70–1.33)
GFR, Est African American: 38 mL/min — ABNORMAL LOW (ref >=60)
GFR, Est Non African American: 33 mL/min — ABNORMAL LOW (ref >=60)
Glucose, Bld: 236 mg/dL — ABNORMAL HIGH (ref 65–99)
POTASSIUM: 4 mmol/L (ref 3.5–5.3)
Sodium: 134 mmol/L — ABNORMAL LOW (ref 135–146)
Total Bilirubin: 0.5 mg/dL (ref 0.2–1.2)
Total Protein: 7.3 g/dL (ref 6.1–8.1)

## 2016-05-16 LAB — PSA: PSA: 0.9 ng/mL (ref <=4.0)

## 2016-05-16 MED ORDER — LOSARTAN POTASSIUM-HCTZ 100-12.5 MG PO TABS: 1.0000 | ORAL_TABLET | Freq: Every day | ORAL | 3 refills | Status: DC

## 2016-05-16 MED ORDER — METOPROLOL TARTRATE 100 MG PO TABS: 100.0000 mg | ORAL_TABLET | Freq: Two times a day (BID) | ORAL | 3 refills | Status: DC

## 2016-05-16 MED ORDER — TIOTROPIUM BROMIDE MONOHYDRATE 18 MCG IN CAPS
18.0000 ug | ORAL_CAPSULE | Freq: Every day | RESPIRATORY_TRACT | 3 refills | Status: DC
Start: 2016-05-16 — End: 2017-02-13

## 2016-05-16 MED ORDER — SILDENAFIL CITRATE 100 MG PO TABS: 100.0000 mg | ORAL_TABLET | Freq: Every day | ORAL | 11 refills | Status: DC

## 2016-05-16 MED ORDER — GLIMEPIRIDE 2 MG PO TABS: 2.0000 mg | ORAL_TABLET | Freq: Every day | ORAL | 3 refills | Status: DC

## 2016-05-16 MED ORDER — ATORVASTATIN CALCIUM 20 MG PO TABS: 20.0000 mg | ORAL_TABLET | Freq: Every day | ORAL | 3 refills | Status: DC

## 2016-05-16 NOTE — Assessment & Plan Note (Signed)
No seizures in 10 years; stay hydrated, well-rested

## 2016-05-16 NOTE — Assessment & Plan Note (Signed)
Avoid NSAIDs, stay hydrated; continue losartan; check urine microalbumin:Cr

## 2016-05-16 NOTE — Assessment & Plan Note (Signed)
With very low LDL; low HDL; check lipids today and adjust medicine if needed

## 2016-05-16 NOTE — Assessment & Plan Note (Signed)
Monitor liver and kidneys 

## 2016-05-16 NOTE — Assessment & Plan Note (Signed)
Well-controlled; changing up BP meds

## 2016-05-16 NOTE — Progress Notes (Signed)
BP 124/82   Pulse 82   Temp 97.8 F (36.6 C) (Oral)   Resp 16   Wt 184 lb 12.8 oz (83.8 kg)   SpO2 97%   BMI 28.10 kg/m    Subjective:    Patient ID: Zachary Wallace, male    DOB: 1955/10/15, 61 y.o.   MRN: [id number]  HPI: Zachary Wallace is a 61 y.o. male  Chief Complaint  Patient presents with  . Follow-up   Patient is here for follow-up; since last visit, no medical excitement  Type 2 diabetes Notices that the right great toe kind of wants to give way almost when walking or stepping on it; no calluses and no sores Some dry mouth in the night, urinating at night 4x; Dr. Rutherford Nail checked his prostate a few years ago; no fam hx of prostate cancer Last A1c was 6.9 in August  High TG of 270 in August; LDL was amazingly only 8; HDL was 36; smoking 1/2 ppd; not ready to quit, seed planted  HTN; BP is well-controlled; checks BP at the drugstore occasionally; tough to split that thiazide so asked about changing  COPD; doing pretty well; no limitations to activities  ED; works well  Hx of low vitamin D  Epilepsy; no seizures in a long time, about 10 years; not on any medicines now  Depression screen [facility] 2/9 05/16/2016 11/14/2015 08/16/2015 04/01/2015 02/28/2015  Decreased Interest 0 0 0 0 0  Down, Depressed, Hopeless 0 0 0 0 0  PHQ - 2 Score 0 0 0 0 0   Relevant past medical, surgical, family and social history reviewed Past Medical History:  Diagnosis Date  . COPD (chronic obstructive pulmonary disease) ([place])   . Diabetes mellitus without complication ([place])   . Hyperlipidemia   . Hypertension    Past Surgical History:  Procedure Laterality Date  . HAND SURGERY Left   . KNEE SURGERY Left    Family History  Problem Relation Age of Onset  . Clotting disorder Mother   . Diabetes Mother   . Hypertension Mother   . Heart attack Father   . Hypertension Sister   . Hypertension Brother   . Vision loss Brother    Social History  Substance Use Topics  . Smoking status:  Current Every Day Smoker  . Smokeless tobacco: Never Used  . Alcohol use No   Interim medical history since last visit reviewed. Allergies and medications reviewed  Review of Systems Per HPI unless specifically indicated above     Objective:    BP 124/82   Pulse 82   Temp 97.8 F (36.6 C) (Oral)   Resp 16   Wt 184 lb 12.8 oz (83.8 kg)   SpO2 97%   BMI 28.10 kg/m   Wt Readings from Last 3 Encounters:  05/16/16 184 lb 12.8 oz (83.8 kg)  11/14/15 190 lb (86.2 kg)  08/16/15 188 lb (85.3 kg)    Physical Exam  Constitutional: He appears well-developed and well-nourished. No distress.  HENT:  Head: Normocephalic and atraumatic.  Right Ear: External ear normal.  Left Ear: External ear normal.  Mouth/Throat: Oropharynx is clear and moist.  Eyes: EOM are normal. Right eye exhibits no discharge. Left eye exhibits no discharge. No scleral icterus.  Neck: No JVD present.  Cardiovascular: Normal rate and regular rhythm.   Pulmonary/Chest: Effort normal and breath sounds normal. He has no wheezes.  Abdominal: Bowel sounds are normal. He exhibits no distension.  Musculoskeletal: Normal range  of motion. He exhibits no edema.  Lymphadenopathy:    He has no cervical adenopathy.  Neurological: He is alert. He displays no tremor. No sensory deficit.  Neuro testing of feet reveals normal proprioception, intact vibration sense, intact monofilament, intact cold  Skin: Skin is warm and dry. He is not diaphoretic. No pallor.  Psychiatric: He has a normal mood and affect. His mood appears not anxious. He does not exhibit a depressed mood.   Diabetic Foot Form - Detailed   Diabetic Foot Exam - detailed Diabetic Foot exam was performed with the following findings:  Yes 05/16/2016  8:55 AM  Visual Foot Exam completed.:  Yes  Are the toenails long?:  No Are the toenails thick?:  No Are the toenails ingrown?:  No Normal Range of Motion:  Yes Pulse Foot Exam completed.:  Yes  Right Dorsalis  Pedis:  Present Left Dorsalis Pedis:  Present  Sensory Foot Exam Completed.:  Yes Swelling:  No Semmes-Weinstein Monofilament Test R Site 1-Great Toe:  Pos L Site 1-Great Toe:  Pos  R Site 4:  Pos L Site 4:  Pos  R Site 5:  Pos L Site 5:  Pos       Results for orders placed or performed in visit on 05/16/16  Lipid panel  Result Value Ref Range   Cholesterol 102 <200 mg/dL   Triglycerides 244 (H) <150 mg/dL   HDL 31 (L) >40 mg/dL   Total CHOL/HDL Ratio 3.3 <5.0 Ratio   VLDL 49 (H) <30 mg/dL   LDL Cholesterol 22 <100 mg/dL  Hemoglobin A1c  Result Value Ref Range   Hgb A1c MFr Bld 9.5 (H) <5.7 %   Mean Plasma Glucose 226 mg/dL  Microalbumin / creatinine urine ratio  Result Value Ref Range   Creatinine, Urine 59 20 - 370 mg/dL   Microalb, Ur <0.2 Not estab mg/dL   Microalb Creat Ratio SEE NOTE <30 mcg/mg creat  COMPLETE METABOLIC PANEL WITH GFR  Result Value Ref Range   Sodium 134 (L) 135 - 146 mmol/L   Potassium 4.0 3.5 - 5.3 mmol/L   Chloride 99 98 - 110 mmol/L   CO2 29 20 - 31 mmol/L   Glucose, Bld 236 (H) 65 - 99 mg/dL   BUN 19 7 - 25 mg/dL   Creat 2.15 (H) 0.70 - 1.33 mg/dL   Total Bilirubin 0.5 0.2 - 1.2 mg/dL   Alkaline Phosphatase 104 40 - 115 U/L   AST 16 10 - 35 U/L   ALT 14 9 - 46 U/L   Total Protein 7.3 6.1 - 8.1 g/dL   Albumin 4.3 3.6 - 5.1 g/dL   Calcium 9.3 8.6 - 10.3 mg/dL   GFR, Est African American 38 (L) >=60 mL/min   GFR, Est Non African American 33 (L) >=60 mL/min  PSA  Result Value Ref Range   PSA 0.9 <=4.0 ng/mL  POCT occult blood stool  Result Value Ref Range   Fecal Occult Blood, POC Negative Negative   Card #1 Date     Card #2 Fecal Occult Blod, POC     Card #2 Date     Card #3 Fecal Occult Blood, POC     Card #3 Date        Assessment & Plan:   Problem List Items Addressed This Visit      Cardiovascular and Mediastinum   BP (high blood pressure)    Well-controlled; changing up BP meds      Relevant Medications  losartan-hydrochlorothiazide (HYZAAR) 100-12.5 MG tablet   metoprolol (LOPRESSOR) 100 MG tablet   sildenafil (VIAGRA) 100 MG tablet   atorvastatin (LIPITOR) 20 MG tablet     Endocrine   Diabetes mellitus ([place]) - Primary    Foot exam by MD today; needs urine microalbumin:creatinine today; may be developing very slight neuropathy of right big toe, will watch; avoid whites; BP controlled; patient will call for eye exam      Relevant Medications   losartan-hydrochlorothiazide (HYZAAR) 100-12.5 MG tablet   atorvastatin (LIPITOR) 20 MG tablet   Other Relevant Orders   Lipid panel (Completed)   Hemoglobin A1c (Completed)   Microalbumin / creatinine urine ratio (Completed)     Nervous and Auditory   Epilepsy ([place])    No seizures in 10 years; stay hydrated, well-rested        Genitourinary   Chronic kidney disease (CKD), stage III (moderate)    Avoid NSAIDs, stay hydrated; continue losartan; check urine microalbumin:Cr        Other   Tobacco abuse    Patient is not ready to quit smoking; I am here to help if/when ready; see AVS      Screening for prostate cancer    Check PSA      Relevant Orders   PSA (Completed)   Medication monitoring encounter    Monitor liver and kidneys      Relevant Orders   COMPLETE METABOLIC PANEL WITH GFR (Completed)   High triglycerides    With very low LDL; low HDL; check lipids today and adjust medicine if needed      Relevant Medications   losartan-hydrochlorothiazide (HYZAAR) 100-12.5 MG tablet   metoprolol (LOPRESSOR) 100 MG tablet   sildenafil (VIAGRA) 100 MG tablet   atorvastatin (LIPITOR) 20 MG tablet   Avitaminosis D    Start back taking 1,000 iu daily      Abnormal presence of protein in urine    Check urine today; patient sees nephrologist at [facility]       Other Visit Diagnoses    Nocturia       Encounter for screening fecal occult blood testing       Relevant Orders   POCT occult blood stool (Completed)      Follow up  plan: Return in about 6 months (around 11/13/2016) for regular follow-up and fasting labs.  An after-visit summary was printed and given to the patient at [facility].  Please see the patient instructions which may contain other information and recommendations beyond what is mentioned above in the assessment and plan.  Meds ordered this encounter  Medications  . losartan-hydrochlorothiazide (HYZAAR) 100-12.5 MG tablet    Sig: Take 1 tablet by mouth daily.    Dispense:  90 tablet    Refill:  3    CANCEL plain losartan; CANCEL chlorthalidone  . metoprolol (LOPRESSOR) 100 MG tablet    Sig: Take 1 tablet (100 mg total) by mouth 2 (two) times daily.    Dispense:  180 tablet    Refill:  3  . tiotropium (SPIRIVA HANDIHALER) 18 MCG inhalation capsule    Sig: Place 1 capsule (18 mcg total) into inhaler and inhale daily.    Dispense:  90 capsule    Refill:  3  . sildenafil (VIAGRA) 100 MG tablet    Sig: Take 1 tablet (100 mg total) by mouth daily.    Dispense:  3 tablet    Refill:  11  . DISCONTD: glimepiride (AMARYL) 2 MG tablet  Sig: Take 1 tablet (2 mg total) by mouth daily with breakfast.    Dispense:  90 tablet    Refill:  3  . atorvastatin (LIPITOR) 20 MG tablet    Sig: Take 1 tablet (20 mg total) by mouth at bedtime.    Dispense:  90 tablet    Refill:  3    Orders Placed This Encounter  Procedures  . Lipid panel  . Hemoglobin A1c  . Microalbumin / creatinine urine ratio  . COMPLETE METABOLIC PANEL WITH GFR  . PSA  . POCT occult blood stool

## 2016-05-16 NOTE — Assessment & Plan Note (Signed)
Patient is not ready to quit smoking; I am here to help if/when ready; see AVS

## 2016-05-16 NOTE — Assessment & Plan Note (Signed)
Foot exam by MD today; needs urine microalbumin:creatinine today; may be developing very slight neuropathy of right big toe, will watch; avoid whites; BP controlled; patient will call for eye exam

## 2016-05-16 NOTE — Patient Instructions (Addendum)
I do encourage you to quit smoking Call [phone] to sign up for smoking cessation classes You can call 1-800-QUIT-NOW to talk with a smoking cessation coach  STOP the plain chlorthalidone STOP the plain losartan Start the new combination losartan-HCTZ  Start back taking vitamin D3 and take 1,000 iu daily  Please do call your eye doctor and schedule an appointment We'll get labs today If you have not heard anything from my staff in a week about any orders/referrals/studies from today, please contact us here to follow-up [phone]

## 2016-05-16 NOTE — Assessment & Plan Note (Signed)
Check PSA. ?

## 2016-05-16 NOTE — Assessment & Plan Note (Addendum)
Check urine today; patient sees nephrologist at [facility]

## 2016-05-16 NOTE — Assessment & Plan Note (Signed)
Start back taking 1,000 iu daily

## 2016-05-17 ENCOUNTER — Other Ambulatory Visit: Payer: Self-pay | Admitting: Family Medicine

## 2016-05-17 DIAGNOSIS — E119 Type 2 diabetes mellitus without complications: Secondary | ICD-10-CM

## 2016-05-17 LAB — MICROALBUMIN / CREATININE URINE RATIO: CREATININE, URINE: 59 mg/dL (ref 20–370)

## 2016-05-17 MED ORDER — LINAGLIPTIN 5 MG PO TABS: 5.0000 mg | ORAL_TABLET | Freq: Every day | ORAL | 3 refills | Status: DC

## 2016-05-17 MED ORDER — GLIMEPIRIDE 4 MG PO TABS: 4.0000 mg | ORAL_TABLET | Freq: Every day | ORAL | 3 refills | Status: DC

## 2016-05-17 NOTE — Progress Notes (Signed)
A1c uncontrolled; increase amaryl, add tradjenta, return in 3 months

## 2016-06-20 ENCOUNTER — Telehealth: Payer: Self-pay | Admitting: Family Medicine

## 2016-06-20 DIAGNOSIS — N183 Chronic kidney disease, stage 3 (moderate): Secondary | ICD-10-CM | POA: Diagnosis not present

## 2016-06-20 DIAGNOSIS — D631 Anemia in chronic kidney disease: Secondary | ICD-10-CM | POA: Diagnosis not present

## 2016-06-20 DIAGNOSIS — R809 Proteinuria, unspecified: Secondary | ICD-10-CM | POA: Diagnosis not present

## 2016-06-20 DIAGNOSIS — F325 Major depressive disorder, single episode, in full remission: Secondary | ICD-10-CM | POA: Diagnosis not present

## 2016-06-20 DIAGNOSIS — E559 Vitamin D deficiency, unspecified: Secondary | ICD-10-CM | POA: Diagnosis not present

## 2016-06-20 DIAGNOSIS — E782 Mixed hyperlipidemia: Secondary | ICD-10-CM | POA: Diagnosis not present

## 2016-06-20 DIAGNOSIS — Z6828 Body mass index (BMI) 28.0-28.9, adult: Secondary | ICD-10-CM | POA: Diagnosis not present

## 2016-06-20 DIAGNOSIS — E1122 Type 2 diabetes mellitus with diabetic chronic kidney disease: Secondary | ICD-10-CM | POA: Diagnosis not present

## 2016-06-20 DIAGNOSIS — Z720 Tobacco use: Secondary | ICD-10-CM | POA: Diagnosis not present

## 2016-06-20 DIAGNOSIS — I10 Essential (primary) hypertension: Secondary | ICD-10-CM | POA: Diagnosis not present

## 2016-08-17 NOTE — Telephone Encounter (Signed)
ERRENOUS °

## 2016-09-24 ENCOUNTER — Other Ambulatory Visit: Payer: Self-pay | Admitting: Family Medicine

## 2016-09-24 DIAGNOSIS — J42 Unspecified chronic bronchitis: Secondary | ICD-10-CM

## 2016-09-25 ENCOUNTER — Other Ambulatory Visit: Payer: Self-pay

## 2016-09-25 MED ORDER — AMLODIPINE BESYLATE 10 MG PO TABS: 10.0000 mg | ORAL_TABLET | Freq: Every day | ORAL | 0 refills | Status: DC

## 2016-09-25 NOTE — Telephone Encounter (Signed)
Patient is overdue for an appointment and labs See prior A1c, lab note I sent refill of amlo with request for appt asap

## 2016-10-05 ENCOUNTER — Encounter: Payer: Self-pay | Admitting: Family Medicine

## 2016-10-05 ENCOUNTER — Ambulatory Visit (INDEPENDENT_AMBULATORY_CARE_PROVIDER_SITE_OTHER): Payer: Medicare Other | Admitting: Family Medicine

## 2016-10-05 DIAGNOSIS — I10 Essential (primary) hypertension: Secondary | ICD-10-CM

## 2016-10-05 DIAGNOSIS — J42 Unspecified chronic bronchitis: Secondary | ICD-10-CM

## 2016-10-05 DIAGNOSIS — N183 Chronic kidney disease, stage 3 unspecified: Secondary | ICD-10-CM

## 2016-10-05 DIAGNOSIS — Z720 Tobacco use: Secondary | ICD-10-CM

## 2016-10-05 DIAGNOSIS — Z5181 Encounter for therapeutic drug level monitoring: Secondary | ICD-10-CM

## 2016-10-05 DIAGNOSIS — E1165 Type 2 diabetes mellitus with hyperglycemia: Secondary | ICD-10-CM

## 2016-10-05 DIAGNOSIS — E781 Pure hyperglyceridemia: Secondary | ICD-10-CM

## 2016-10-05 DIAGNOSIS — E559 Vitamin D deficiency, unspecified: Secondary | ICD-10-CM | POA: Diagnosis not present

## 2016-10-05 MED ORDER — SILDENAFIL CITRATE 100 MG PO TABS: 100.0000 mg | ORAL_TABLET | Freq: Every day | ORAL | 20 refills | Status: DC

## 2016-10-05 MED ORDER — ALBUTEROL SULFATE HFA 108 (90 BASE) MCG/ACT IN AERS: INHALATION_SPRAY | RESPIRATORY_TRACT | 2 refills | Status: DC

## 2016-10-05 MED ORDER — AMLODIPINE BESYLATE 10 MG PO TABS: 10.0000 mg | ORAL_TABLET | Freq: Every day | ORAL | 3 refills | Status: DC

## 2016-10-05 NOTE — Assessment & Plan Note (Signed)
Check A1c today; see if tradjenta is needed; diet discussed

## 2016-10-05 NOTE — Assessment & Plan Note (Signed)
Well-controlled; try the DASH guidelines 

## 2016-10-05 NOTE — Assessment & Plan Note (Signed)
stable °

## 2016-10-05 NOTE — Assessment & Plan Note (Signed)
Check liver 

## 2016-10-05 NOTE — Patient Instructions (Signed)
I do encourage you to quit smoking Call [phone] to sign up for smoking cessation classes You can call 1-800-QUIT-NOW to talk with a smoking cessation coach Try to limit saturated fats in your diet (bologna, hot dogs, barbeque, cheeseburgers, hamburgers, steak, bacon, sausage, cheese, etc.) and get more fresh fruits, vegetables, and whole grains Please do see your eye doctor regularly, and have your eyes examined every year (or more often per his or her recommendation) Check your feet every night and let me know right away of any sores, infections, numbness, etc. Try to limit sweets, white bread, white rice, white potatoes It is okay with me for you to not check your fingerstick blood sugars (per [employer] of Endocrinology Best Practices), unless you are interested and feel it would be helpful for you

## 2016-10-05 NOTE — Assessment & Plan Note (Signed)
He's not quite ready to quit; I am here to help when need4ed

## 2016-10-05 NOTE — Progress Notes (Signed)
BP 126/74   Pulse 68   Temp (!) 97.5 F (36.4 C) (Oral)   Resp 16   Wt 190 lb (86.2 kg)   SpO2 92%   BMI 28.89 kg/m    Subjective:    Patient ID: Zachary Wallace, male    DOB: [birth year], 61 y.o.   MRN: [id number]  HPI: Zachary Wallace is a 61 y.o. male  Chief Complaint  Patient presents with  . Follow-up  . Medication Refill   HPI Uncontrolled type 2 diabetes More numbness on the right toes, frequent thirst; nocturia 4-5 x a night Last A1c really shot up from 6.9 to 9.5 Activity level has picked up No change in diet Never did start the Tradjenta  HTN; well-controlled; 86 on the bottom, highest is 90 Not much stress, not much salt No decongestants because of kidney issues  CKD; avoiding NSAIDs; seeing Dr. Radene Knee; 2x a year; next in Sept; good water drinker; Cr 1.66 in June 2017 COPD; breathing okay  Vitamin D deficiency; 15 last June  Depression screen [facility] 2/9 10/05/2016 05/16/2016 11/14/2015 08/16/2015 04/01/2015  Decreased Interest 0 0 0 0 0  Down, Depressed, Hopeless 0 0 0 0 0  PHQ - 2 Score 0 0 0 0 0    Relevant past medical, surgical, family and social history reviewed Past Medical History:  Diagnosis Date  . COPD (chronic obstructive pulmonary disease) ([place])   . Diabetes mellitus without complication ([place])   . Hyperlipidemia   . Hypertension    Past Surgical History:  Procedure Laterality Date  . HAND SURGERY Left   . KNEE SURGERY Left    Family History  Problem Relation Age of Onset  . Clotting disorder Mother   . Diabetes Mother   . Hypertension Mother   . Heart attack Father   . Hypertension Sister   . Hypertension Brother   . Vision loss Brother    Social History   Social History  . Marital status: Divorced    Spouse name: N/A  . Number of children: N/A  . Years of education: N/A   Occupational History  . Not on file.   Social History Main Topics  . Smoking status: Current Every Day Smoker  . Smokeless tobacco: Never Used  . Alcohol use  No  . Drug use: No  . Sexual activity: Yes   Other Topics Concern  . Not on file   Social History Narrative  . No narrative on file    Interim medical history since last visit reviewed. Allergies and medications reviewed  Review of Systems Per HPI unless specifically indicated above     Objective:    BP 126/74   Pulse 68   Temp (!) 97.5 F (36.4 C) (Oral)   Resp 16   Wt 190 lb (86.2 kg)   SpO2 92%   BMI 28.89 kg/m   Wt Readings from Last 3 Encounters:  10/05/16 190 lb (86.2 kg)  05/16/16 184 lb 12.8 oz (83.8 kg)  11/14/15 190 lb (86.2 kg)    Physical Exam  Constitutional: He appears well-developed and well-nourished. No distress.  HENT:  Head: Normocephalic and atraumatic.  Right Ear: External ear normal.  Left Ear: External ear normal.  Mouth/Throat: Oropharynx is clear and moist.  Eyes: EOM are normal. Right eye exhibits no discharge. Left eye exhibits no discharge. No scleral icterus.  Neck: No JVD present.  Cardiovascular: Normal rate and regular rhythm.   Pulmonary/Chest: Effort normal and breath sounds normal.  He has no wheezes.  Abdominal: Bowel sounds are normal. He exhibits no distension.  Musculoskeletal: Normal range of motion. He exhibits no edema.  Lymphadenopathy:    He has no cervical adenopathy.  Neurological: He is alert. He displays no tremor. No sensory deficit.  Neuro testing of feet reveals normal proprioception, intact vibration sense, intact monofilament, intact cold  Skin: Skin is warm and dry. He is not diaphoretic. No pallor.  Psychiatric: He has a normal mood and affect. His mood appears not anxious. He does not exhibit a depressed mood.   Diabetic Foot Form - Detailed   Diabetic Foot Exam - detailed Diabetic Foot exam was performed with the following findings:  Yes 10/05/2016  2:48 PM  Visual Foot Exam completed.:  Yes  Pulse Foot Exam completed.:  Yes  Right Dorsalis Pedis:  Present Left Dorsalis Pedis:  Present  Sensory Foot  Exam Completed.:  Yes Semmes-Weinstein Monofilament Test R Site 1-Great Toe:  Pos L Site 1-Great Toe:  Pos       Results for orders placed or performed in visit on 05/16/16  Lipid panel  Result Value Ref Range   Cholesterol 102 <200 mg/dL   Triglycerides 244 (H) <150 mg/dL   HDL 31 (L) >40 mg/dL   Total CHOL/HDL Ratio 3.3 <5.0 Ratio   VLDL 49 (H) <30 mg/dL   LDL Cholesterol 22 <100 mg/dL  Hemoglobin A1c  Result Value Ref Range   Hgb A1c MFr Bld 9.5 (H) <5.7 %   Mean Plasma Glucose 226 mg/dL  Microalbumin / creatinine urine ratio  Result Value Ref Range   Creatinine, Urine 59 20 - 370 mg/dL   Microalb, Ur <0.2 Not estab mg/dL   Microalb Creat Ratio SEE NOTE <30 mcg/mg creat  COMPLETE METABOLIC PANEL WITH GFR  Result Value Ref Range   Sodium 134 (L) 135 - 146 mmol/L   Potassium 4.0 3.5 - 5.3 mmol/L   Chloride 99 98 - 110 mmol/L   CO2 29 20 - 31 mmol/L   Glucose, Bld 236 (H) 65 - 99 mg/dL   BUN 19 7 - 25 mg/dL   Creat 2.15 (H) 0.70 - 1.33 mg/dL   Total Bilirubin 0.5 0.2 - 1.2 mg/dL   Alkaline Phosphatase 104 40 - 115 U/L   AST 16 10 - 35 U/L   ALT 14 9 - 46 U/L   Total Protein 7.3 6.1 - 8.1 g/dL   Albumin 4.3 3.6 - 5.1 g/dL   Calcium 9.3 8.6 - 10.3 mg/dL   GFR, Est African American 38 (L) >=60 mL/min   GFR, Est Non African American 33 (L) >=60 mL/min  PSA  Result Value Ref Range   PSA 0.9 <=4.0 ng/mL  POCT occult blood stool  Result Value Ref Range   Fecal Occult Blood, POC Negative Negative   Card #1 Date     Card #2 Fecal Occult Blod, POC     Card #2 Date     Card #3 Fecal Occult Blood, POC     Card #3 Date        Assessment & Plan:   Problem List Items Addressed This Visit      Cardiovascular and Mediastinum   BP (high blood pressure)    Well-controlled; try the DASH guidelines      Relevant Medications   sildenafil (VIAGRA) 100 MG tablet   amLODipine (NORVASC) 10 MG tablet     Respiratory   COPD (chronic obstructive pulmonary disease) (HCC)  stable      Relevant Medications   albuterol (PROAIR HFA) 108 (90 Base) MCG/ACT inhaler     Endocrine   Diabetes mellitus (HCC)    Check A1c today; see if tradjenta is needed; diet discussed      Relevant Orders   Hemoglobin A1c     Genitourinary   Chronic kidney disease (CKD), stage III (moderate)    Stay hydrated; check Cr; sees nephrologist        Other   Tobacco abuse    He's not quite ready to quit; I am here to help when need4ed      Medication monitoring encounter    Check liver      Relevant Orders   COMPLETE METABOLIC PANEL WITH GFR   High triglycerides    Check fasting labs; limit starches      Relevant Medications   sildenafil (VIAGRA) 100 MG tablet   amLODipine (NORVASC) 10 MG tablet   Other Relevant Orders   Lipid panel   Avitaminosis D    Check vit D, send copy to nephrologist      Relevant Orders   VITAMIN D 25 Hydroxy (Vit-D Deficiency, Fractures)       Follow up plan: Return in about 3 months (around 01/05/2017) for twenty minute follow-up with fasting labs.  An after-visit summary was printed and given to the patient at [place].  Please see the patient instructions which may contain other information and recommendations beyond what is mentioned above in the assessment and plan.  Meds ordered this encounter  Medications  . sildenafil (VIAGRA) 100 MG tablet    Sig: Take 1 tablet (100 mg total) by mouth daily.    Dispense:  3 tablet    Refill:  20  . albuterol (PROAIR HFA) 108 (90 Base) MCG/ACT inhaler    Sig: INHALE 2 PUFFS BY MOUTH FOUR TIMES DAILY AS NEEDED    Dispense:  8.5 g    Refill:  2  . amLODipine (NORVASC) 10 MG tablet    Sig: Take 1 tablet (10 mg total) by mouth daily. Appointment please as soon as possible    Dispense:  90 tablet    Refill:  3    Orders Placed This Encounter  Procedures  . COMPLETE METABOLIC PANEL WITH GFR  . Hemoglobin A1c  . Lipid panel  . VITAMIN D 25 Hydroxy (Vit-D Deficiency, Fractures)

## 2016-10-05 NOTE — Assessment & Plan Note (Signed)
Check fasting labs; limit starches

## 2016-10-05 NOTE — Assessment & Plan Note (Signed)
Check vit D, send copy to nephrologist

## 2016-10-05 NOTE — Assessment & Plan Note (Signed)
Stay hydrated; check Cr; sees nephrologist

## 2016-10-06 LAB — COMPLETE METABOLIC PANEL WITH GFR
ALBUMIN: 4.4 g/dL (ref 3.6–5.1)
ALK PHOS: 99 U/L (ref 40–115)
ALT: 9 U/L (ref 9–46)
AST: 17 U/L (ref 10–35)
BUN: 14 mg/dL (ref 7–25)
CALCIUM: 9.3 mg/dL (ref 8.6–10.3)
CO2: 26 mmol/L (ref 20–31)
CREATININE: 1.72 mg/dL — AB (ref 0.70–1.33)
Chloride: 99 mmol/L (ref 98–110)
GFR, EST AFRICAN AMERICAN: 50 mL/min — AB (ref >=60)
GFR, EST NON AFRICAN AMERICAN: 43 mL/min — AB (ref >=60)
Glucose, Bld: 64 mg/dL — ABNORMAL LOW (ref 65–99)
Potassium: 3.6 mmol/L (ref 3.5–5.3)
Sodium: 137 mmol/L (ref 135–146)
TOTAL PROTEIN: 7.1 g/dL (ref 6.1–8.1)
Total Bilirubin: 0.4 mg/dL (ref 0.2–1.2)

## 2016-10-06 LAB — LIPID PANEL
CHOLESTEROL: 90 mg/dL (ref <200)
HDL: 32 mg/dL — ABNORMAL LOW (ref >40)
LDL Cholesterol: 31 mg/dL (ref <100)
Total CHOL/HDL Ratio: 2.8 Ratio (ref <5.0)
Triglycerides: 133 mg/dL (ref <150)
VLDL: 27 mg/dL (ref <30)

## 2016-10-06 LAB — VITAMIN D 25 HYDROXY (VIT D DEFICIENCY, FRACTURES): VIT D 25 HYDROXY: 25 ng/mL — AB (ref 30–100)

## 2016-10-06 LAB — HEMOGLOBIN A1C
HEMOGLOBIN A1C: 6.4 % — AB (ref <5.7)
MEAN PLASMA GLUCOSE: 137 mg/dL

## 2016-10-08 ENCOUNTER — Telehealth: Payer: Self-pay

## 2016-10-08 NOTE — Telephone Encounter (Signed)
Left messaged to call our office regarding lab results

## 2016-10-08 NOTE — Telephone Encounter (Signed)
-----   Message from Arnetha Courser, MD sent at 10/06/2016  1:16 PM EDT ----- I tried to call patient, reached voicemail, left message labs look much better Please call patient Monday and let him know that his A1c has dropped all the way from 9.5 to 6.4; I am thrilled! His vitamin D is low, so recommend 2,000 iu vitamin D3 once a day for 3 months, the just 1,000 iu daily His creatinine has improved, from 2.15 to 1.72; his GFR improved from 38 to 50; please forward to his kidney doctor His HDL is low, which increases risk of heart attack; quitting smoking will help; let us know if we can help; thank you

## 2016-10-12 ENCOUNTER — Telehealth: Payer: Self-pay

## 2016-10-12 NOTE — Telephone Encounter (Signed)
Went over pt labs and instructions. Faxed over pt labs results to [employer], ANP @  [facility].

## 2016-10-26 DIAGNOSIS — E119 Type 2 diabetes mellitus without complications: Secondary | ICD-10-CM | POA: Diagnosis not present

## 2016-10-26 LAB — HM DIABETES EYE EXAM

## 2016-11-14 ENCOUNTER — Ambulatory Visit: Payer: Medicare Other | Admitting: Family Medicine

## 2016-12-17 DIAGNOSIS — E782 Mixed hyperlipidemia: Secondary | ICD-10-CM | POA: Diagnosis not present

## 2016-12-17 DIAGNOSIS — E1122 Type 2 diabetes mellitus with diabetic chronic kidney disease: Secondary | ICD-10-CM | POA: Diagnosis not present

## 2016-12-17 DIAGNOSIS — Z720 Tobacco use: Secondary | ICD-10-CM | POA: Diagnosis not present

## 2016-12-17 DIAGNOSIS — R809 Proteinuria, unspecified: Secondary | ICD-10-CM | POA: Diagnosis not present

## 2016-12-17 DIAGNOSIS — N183 Chronic kidney disease, stage 3 (moderate): Secondary | ICD-10-CM | POA: Diagnosis not present

## 2016-12-17 DIAGNOSIS — Z6827 Body mass index (BMI) 27.0-27.9, adult: Secondary | ICD-10-CM | POA: Diagnosis not present

## 2016-12-17 DIAGNOSIS — E559 Vitamin D deficiency, unspecified: Secondary | ICD-10-CM | POA: Diagnosis not present

## 2016-12-17 DIAGNOSIS — F325 Major depressive disorder, single episode, in full remission: Secondary | ICD-10-CM | POA: Diagnosis not present

## 2016-12-17 DIAGNOSIS — I129 Hypertensive chronic kidney disease with stage 1 through stage 4 chronic kidney disease, or unspecified chronic kidney disease: Secondary | ICD-10-CM | POA: Diagnosis not present

## 2017-01-07 ENCOUNTER — Ambulatory Visit: Payer: Medicare Other | Admitting: Family Medicine

## 2017-01-15 ENCOUNTER — Other Ambulatory Visit: Payer: Self-pay | Admitting: Family Medicine

## 2017-01-15 NOTE — Telephone Encounter (Signed)
Wow, patient is requesting refill of SABA; I prescribed one inhaler with two refills on July 13th Please call him If he has already used 3 inhalers, we need to adjust his medicine; please make him an appointment within the next week or 10 days I refilled the inhaler, but we need to do something about his breathing; he's using too much

## 2017-01-16 NOTE — Telephone Encounter (Signed)
Called pt no answer. LM for pt informing him of the information below per Dr.Lada.

## 2017-01-17 ENCOUNTER — Telehealth: Payer: Self-pay | Admitting: *Deleted

## 2017-01-17 NOTE — Telephone Encounter (Signed)
Copied from [place]. Topic: Inquiry >> Jan 17, 2017  7:50 AM Aurelio Brash B wrote: Reason for CRM: PT returning Dr Sanda Klein phone call from yesterday

## 2017-01-23 ENCOUNTER — Ambulatory Visit: Payer: Medicare Other | Admitting: Family Medicine

## 2017-02-13 ENCOUNTER — Ambulatory Visit (INDEPENDENT_AMBULATORY_CARE_PROVIDER_SITE_OTHER): Payer: Medicare Other | Admitting: Family Medicine

## 2017-02-13 ENCOUNTER — Encounter: Payer: Self-pay | Admitting: Family Medicine

## 2017-02-13 VITALS — BP 124/76 | HR 61 | Temp 97.5°F | Resp 16 | Wt 191.3 lb

## 2017-02-13 DIAGNOSIS — E559 Vitamin D deficiency, unspecified: Secondary | ICD-10-CM

## 2017-02-13 DIAGNOSIS — N183 Chronic kidney disease, stage 3 unspecified: Secondary | ICD-10-CM

## 2017-02-13 DIAGNOSIS — J42 Unspecified chronic bronchitis: Secondary | ICD-10-CM

## 2017-02-13 DIAGNOSIS — Z87891 Personal history of nicotine dependence: Secondary | ICD-10-CM

## 2017-02-13 DIAGNOSIS — E1165 Type 2 diabetes mellitus with hyperglycemia: Secondary | ICD-10-CM

## 2017-02-13 DIAGNOSIS — Z720 Tobacco use: Secondary | ICD-10-CM

## 2017-02-13 DIAGNOSIS — Z5181 Encounter for therapeutic drug level monitoring: Secondary | ICD-10-CM

## 2017-02-13 DIAGNOSIS — R0683 Snoring: Secondary | ICD-10-CM

## 2017-02-13 DIAGNOSIS — I10 Essential (primary) hypertension: Secondary | ICD-10-CM

## 2017-02-13 DIAGNOSIS — G40909 Epilepsy, unspecified, not intractable, without status epilepticus: Secondary | ICD-10-CM

## 2017-02-13 DIAGNOSIS — E786 Lipoprotein deficiency: Secondary | ICD-10-CM | POA: Diagnosis not present

## 2017-02-13 MED ORDER — SILDENAFIL CITRATE 100 MG PO TABS: 100.0000 mg | ORAL_TABLET | Freq: Every day | ORAL | 20 refills | Status: DC

## 2017-02-13 MED ORDER — GLYCOPYRROLATE-FORMOTEROL 9-4.8 MCG/ACT IN AERO: 2.0000 | INHALATION_SPRAY | Freq: Two times a day (BID) | RESPIRATORY_TRACT | 11 refills | Status: DC

## 2017-02-13 NOTE — Assessment & Plan Note (Signed)
Monitor liver and kidneys 

## 2017-02-13 NOTE — Assessment & Plan Note (Signed)
And possible witnessed sleep apnea; refer to Dr. Ashby Dawes for evaluation, consider sleep study

## 2017-02-13 NOTE — Assessment & Plan Note (Signed)
stable °

## 2017-02-13 NOTE — Assessment & Plan Note (Signed)
Add ICS/LABA; refer to pulm

## 2017-02-13 NOTE — Patient Instructions (Addendum)
Stop the Spiriva and start the new medicine for your breathing Avoid salt substitutes Call if any issues We'll get labs today   Health Risks of Smoking Smoking cigarettes is very bad for your health. Tobacco smoke has over 200 known poisons in it. It contains the poisonous gases nitrogen oxide and carbon monoxide. There are over 60 chemicals in tobacco smoke that cause cancer. Smoking is difficult to quit because a chemical in tobacco, called nicotine, causes addiction or dependence. When you smoke and inhale, nicotine is absorbed rapidly into the bloodstream through your lungs. Both inhaled and non-inhaled nicotine may be addictive. What are the risks of cigarette smoke? Cigarette smokers have an increased risk of many serious medical problems, including:  Lung cancer.  Lung disease, such as pneumonia, bronchitis, and emphysema.  Chest pain (angina) and heart attack because the heart is not getting enough oxygen.  Heart disease and peripheral blood vessel disease.  High blood pressure (hypertension).  Stroke.  Oral cancer, including cancer of the lip, mouth, or voice box.  Bladder cancer.  Pancreatic cancer.  Cervical cancer.  Pregnancy complications, including premature birth.  Stillbirths and smaller newborn babies, birth defects, and genetic damage to sperm.  Early menopause.  Lower estrogen level for women.  Infertility.  Facial wrinkles.  Blindness.  Increased risk of broken bones (fractures).  Senile dementia.  Stomach ulcers and internal bleeding.  Delayed wound healing and increased risk of complications during surgery.  Even smoking lightly shortens your life expectancy by several years.  Because of secondhand smoke exposure, children of smokers have an increased risk of the following:  Sudden infant death syndrome (SIDS).  Respiratory infections.  Lung cancer.  Heart disease.  Ear infections.  What are the benefits of quitting? There are  many health benefits of quitting smoking. Here are some of them:  Within days of quitting smoking, your risk of having a heart attack decreases, your blood flow improves, and your lung capacity improves. Blood pressure, pulse rate, and breathing patterns start returning to normal soon after quitting.  Within months, your lungs may clear up completely.  Quitting for 10 years reduces your risk of developing lung cancer and heart disease to almost that of a nonsmoker.  People who quit may see an improvement in their overall quality of life.  How do I quit smoking? Smoking is an addiction with both physical and psychological effects, and longtime habits can be hard to change. Your health care provider can recommend:  Programs and community resources, which may include group support, education, or talk therapy.  Prescription medicines to help reduce cravings.  Nicotine replacement products, such as patches, gum, and nasal sprays. Use these products only as directed. Do not replace cigarette smoking with electronic cigarettes, which are commonly called e-cigarettes. The safety of e-cigarettes is not known, and some may contain harmful chemicals.  A combination of two or more of these methods.  Where to find more information:  American Lung Association: www.lung.org  American Cancer Society: www.cancer.org Summary  Smoking cigarettes is very bad for your health. Cigarette smokers have an increased risk of many serious medical problems, including several cancers, heart disease, and stroke.  Smoking is an addiction with both physical and psychological effects, and longtime habits can be hard to change.  By stopping right away, you can greatly reduce the risk of medical problems for you and your family.  To help you quit smoking, your health care provider can recommend programs, community resources, prescription medicines, and  nicotine replacement products such as patches, gum, and nasal  sprays. This information is not intended to replace advice given to you by your health care provider. Make sure you discuss any questions you have with your health care provider. Document Released: 04/19/2004 Document Revised: 03/16/2016 Document Reviewed: 03/16/2016 Elsevier Interactive Patient Education  2017 [employer].

## 2017-02-13 NOTE — Assessment & Plan Note (Signed)
Not quite ready to quit; I am here if/when needed

## 2017-02-13 NOTE — Assessment & Plan Note (Signed)
Explained that smoking cessation and walking / activity will help this

## 2017-02-13 NOTE — Assessment & Plan Note (Signed)
Taking supplement.

## 2017-02-13 NOTE — Progress Notes (Signed)
BP 124/76   Pulse 61   Temp (!) 97.5 F (36.4 C) (Oral)   Resp 16   Wt 191 lb 4.8 oz (86.8 kg)   SpO2 92%   BMI 29.09 kg/m    Subjective:    Patient ID: Zachary Wallace, male    DOB: Mar 28, 1955, 61 y.o.   MRN: [id number]  HPI: Zachary Wallace is a 61 y.o. male  Chief Complaint  Patient presents with  . Follow-up    HPI Patient is here for f/u  COPD; having to use rescue inhaler more often; no new pets; no new carpet; electric heat; happens this time of year; summer was okay; no leg edema; no orthopnea  Type 2 DM; not checking sugars with my blessing; dry mouth at night when waking up; he snores, stops breathing sometimes; feels good in the morning A1c had really dropped from 9.5 to 6.4 last check  High cholesterol; trying to limit fatty meats; no problems with statin; runs in the family; does a lot of walking  HTN; well-controlled; on medicine; uses salt  Aspirin; no bleeding  Vit D def, taking extra  CKD stage 3; seeing Dr. Radene Knee; just recently went in March; stable per her note (we reviewed her history and A/P)  Immunizations: has had the PCV-13; no record of 23 in this chart, CMA looking and found it; flu shot UTD, early October  Epilepsy; no recent seizures  Smoking: at least 1 ppd x 30 years  Depression screen [facility] 2/9 02/13/2017 10/05/2016 05/16/2016 11/14/2015 08/16/2015  Decreased Interest 0 0 0 0 0  Down, Depressed, Hopeless 0 0 0 0 0  PHQ - 2 Score 0 0 0 0 0   Relevant past medical, surgical, family and social history reviewed Past Medical History:  Diagnosis Date  . COPD (chronic obstructive pulmonary disease) ([place])   . Diabetes mellitus without complication ([place])   . Hyperlipidemia   . Hypertension    Past Surgical History:  Procedure Laterality Date  . HAND SURGERY Left   . KNEE SURGERY Left    Family History  Problem Relation Age of Onset  . Clotting disorder Mother   . Diabetes Mother   . Hypertension Mother   . Heart attack Father   .  Hypertension Sister   . Hypertension Brother   . Vision loss Brother    Social History   Socioeconomic History  . Marital status: Divorced    Spouse name: Not on file  . Number of children: Not on file  . Years of education: Not on file  . Highest education level: Not on file  Social Needs  . Financial resource strain: Not on file  . Food insecurity - worry: Not on file  . Food insecurity - inability: Not on file  . Transportation needs - medical: Not on file  . Transportation needs - non-medical: Not on file  Occupational History  . Not on file  Tobacco Use  . Smoking status: Current Every Day Smoker  . Smokeless tobacco: Never Used  Substance and Sexual Activity  . Alcohol use: No    Alcohol/week: 0.0 oz  . Drug use: No  . Sexual activity: Yes  Other Topics Concern  . Not on file  Social History Narrative  . Not on file  MD note: he is not ready to quit smoking  Interim medical history since last visit reviewed. Allergies and medications reviewed  Review of Systems Per HPI unless specifically indicated above  Objective:    BP 124/76   Pulse 61   Temp (!) 97.5 F (36.4 C) (Oral)   Resp 16   Wt 191 lb 4.8 oz (86.8 kg)   SpO2 92%   BMI 29.09 kg/m   Wt Readings from Last 3 Encounters:  02/13/17 191 lb 4.8 oz (86.8 kg)  10/05/16 190 lb (86.2 kg)  05/16/16 184 lb 12.8 oz (83.8 kg)    Physical Exam Diabetic Foot Form - Detailed   Diabetic Foot Exam - detailed Diabetic Foot exam was performed with the following findings:  Yes 02/13/2017  8:49 AM  Visual Foot Exam completed.:  Yes  Pulse Foot Exam completed.:  Yes  Right Dorsalis Pedis:  Present Left Dorsalis Pedis:  Present  Sensory Foot Exam Completed.:  Yes Semmes-Weinstein Monofilament Test R Site 1-Great Toe:  Pos L Site 1-Great Toe:  Pos        Results for orders placed or performed in visit on 10/26/16  HM DIABETES EYE EXAM  Result Value Ref Range   HM Diabetic Eye Exam No Retinopathy No  Retinopathy      Assessment & Plan:   Problem List Items Addressed This Visit      Cardiovascular and Mediastinum   BP (high blood pressure) (Chronic)    Controlled; good note from March with nephrology pointing out what to do if pressure goes up or down; avoid salt substitutes      Relevant Medications   sildenafil (VIAGRA) 100 MG tablet     Respiratory   COPD (chronic obstructive pulmonary disease) (HCC) (Chronic)    Add ICS/LABA; refer to pulm      Relevant Medications   Glycopyrrolate-Formoterol (BEVESPI AEROSPHERE) 9-4.8 MCG/ACT AERO   Other Relevant Orders   Ambulatory referral to Pulmonology     Endocrine   Diabetes mellitus ([place]) - Primary (Chronic)    Foot exam by MD; flu shot UTD; eye exam UTD; check A1c      Relevant Orders   Hemoglobin A1c   Lipid panel     Nervous and Auditory   Convulsions, 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 Radene Knee; avoid NSAIDs        Other   Tobacco abuse    Not quite ready to quit; I am here if/when needed      Snoring    And possible witnessed sleep apnea; refer to Dr. Ashby Dawes for evaluation, consider sleep study      Relevant Orders   Ambulatory referral to Pulmonology   Medication monitoring encounter    Monitor liver and kidneys      Relevant Orders   COMPLETE METABOLIC PANEL WITH GFR   Low HDL (under 40)    Explained that smoking cessation and walking / activity will help this      Avitaminosis D    Taking supplement       Other Visit Diagnoses    Tobacco use       Relevant Orders   CT CHEST LUNG CA SCREEN LOW DOSE W/O CM   History of tobacco use, presenting hazards to health           Follow up plan: Return in about 6 months (around 08/13/2017).  An after-visit summary was printed and given to the patient at [place].  Please see the patient instructions which may contain other  information  and recommendations beyond what is mentioned above in the assessment and plan.  Meds ordered this encounter  Medications  . Glycopyrrolate-Formoterol (BEVESPI AEROSPHERE) 9-4.8 MCG/ACT AERO    Sig: Inhale 2 puffs into the lungs 2 (two) times daily. STOP Spiriva    Dispense:  1 Inhaler    Refill:  11  . sildenafil (VIAGRA) 100 MG tablet    Sig: Take 1 tablet (100 mg total) by mouth daily.    Dispense:  3 tablet    Refill:  20    Orders Placed This Encounter  Procedures  . CT CHEST LUNG CA SCREEN LOW DOSE W/O CM  . COMPLETE METABOLIC PANEL WITH GFR  . Hemoglobin A1c  . Lipid panel  . Ambulatory referral to Pulmonology

## 2017-02-13 NOTE — Assessment & Plan Note (Signed)
Following with Dr. Radene Knee; avoid NSAIDs

## 2017-02-13 NOTE — Assessment & Plan Note (Signed)
Controlled; good note from March with nephrology pointing out what to do if pressure goes up or down; avoid salt substitutes

## 2017-02-13 NOTE — Assessment & Plan Note (Signed)
Foot exam by MD; flu shot UTD; eye exam UTD; check A1c

## 2017-02-14 LAB — COMPLETE METABOLIC PANEL WITH GFR
AG RATIO: 1.5 (calc) (ref 1.0–2.5)
ALBUMIN MSPROF: 4.6 g/dL (ref 3.6–5.1)
ALKALINE PHOSPHATASE (APISO): 102 U/L (ref 40–115)
ALT: 15 U/L (ref 9–46)
AST: 20 U/L (ref 10–35)
BILIRUBIN TOTAL: 0.4 mg/dL (ref 0.2–1.2)
BUN / CREAT RATIO: 7 (calc) (ref 6–22)
BUN: 15 mg/dL (ref 7–25)
CHLORIDE: 101 mmol/L (ref 98–110)
CO2: 30 mmol/L (ref 20–32)
Calcium: 9.5 mg/dL (ref 8.6–10.3)
Creat: 2.17 mg/dL — ABNORMAL HIGH (ref 0.70–1.33)
GFR, Est African American: 37 mL/min/{1.73_m2} — ABNORMAL LOW (ref >=60)
GFR, Est Non African American: 32 mL/min/{1.73_m2} — ABNORMAL LOW (ref >=60)
GLUCOSE: 156 mg/dL — AB (ref 65–99)
Globulin: 3 g/dL (calc) (ref 1.9–3.7)
POTASSIUM: 3.9 mmol/L (ref 3.5–5.3)
SODIUM: 137 mmol/L (ref 135–146)
Total Protein: 7.6 g/dL (ref 6.1–8.1)

## 2017-02-14 LAB — LIPID PANEL
CHOLESTEROL: 96 mg/dL (ref <200)
HDL: 31 mg/dL — ABNORMAL LOW (ref >40)
LDL CHOLESTEROL (CALC): 36 mg/dL
Non-HDL Cholesterol (Calc): 65 mg/dL (calc) (ref <130)
Total CHOL/HDL Ratio: 3.1 (calc) (ref <5.0)
Triglycerides: 249 mg/dL — ABNORMAL HIGH (ref <150)

## 2017-02-14 LAB — HEMOGLOBIN A1C
Hgb A1c MFr Bld: 6.7 % of total Hgb — ABNORMAL HIGH (ref <5.7)
Mean Plasma Glucose: 146 (calc)
eAG (mmol/L): 8.1 (calc)

## 2017-02-15 ENCOUNTER — Telehealth: Payer: Self-pay

## 2017-02-15 ENCOUNTER — Telehealth: Payer: Self-pay | Admitting: Family Medicine

## 2017-02-15 NOTE — Telephone Encounter (Signed)
Called pt no answer. Lm for pt to call back for results (generic answering machine, so did not leave detailed message) CRM created.

## 2017-02-15 NOTE — Telephone Encounter (Signed)
Copied from [place] [phone]. Topic: Quick Communication - Lab Results >> Feb 15, 2017  4:36 PM Zachary Wallace, NT wrote: Patient called back for lab results says he was retuning call from practice

## 2017-02-15 NOTE — Telephone Encounter (Signed)
-----   Message from Arnetha Courser, MD sent at 02/15/2017  9:19 AM EST ----- Please let the patient know that his kidney function has worsened since the last check, so we'd like him to call his kidney doctor's office next week to see if she wants to do anything differently; please fax copy of labs to kidney doctor His liver enzymes are normal; his A1c shows good control of his diabetes; TG are still too high and the HDL is too low, so we'll encourage weight loss, more walking, and diet low in starches and fried foods; thank you

## 2017-02-18 NOTE — Telephone Encounter (Signed)
Called pt, wife answers. States that she will have pt call me back.

## 2017-02-18 NOTE — Telephone Encounter (Signed)
Called pt informed him of lab results per Dr.Lada's note. Pt gave verbal understanding. Labs routed to Kidney doctor.

## 2017-02-20 ENCOUNTER — Other Ambulatory Visit: Payer: Self-pay | Admitting: Family Medicine

## 2017-02-20 ENCOUNTER — Telehealth: Payer: Self-pay | Admitting: *Deleted

## 2017-02-20 NOTE — Telephone Encounter (Signed)
Received referral for low dose lung cancer screening CT scan. Message left at phone number listed in EMR for patient to call me back to facilitate scheduling scan. Note, previously discussed screening with patient and identified that he needed am appt.

## 2017-02-22 ENCOUNTER — Telehealth: Payer: Self-pay | Admitting: *Deleted

## 2017-02-22 DIAGNOSIS — Z122 Encounter for screening for malignant neoplasm of respiratory organs: Secondary | ICD-10-CM

## 2017-02-22 DIAGNOSIS — Z87891 Personal history of nicotine dependence: Secondary | ICD-10-CM

## 2017-02-22 NOTE — Telephone Encounter (Signed)
Received referral for initial lung cancer screening scan. Contacted patient and obtained smoking history,(current, 41 pack year) as well as answering questions related to screening process. Patient denies signs of lung cancer such as weight loss or hemoptysis. Patient denies comorbidity that would prevent curative treatment if lung cancer were found. Patient is scheduled for shared decision making visit and CT scan on 03/08/17.

## 2017-03-01 ENCOUNTER — Other Ambulatory Visit: Payer: Self-pay | Admitting: Family Medicine

## 2017-03-01 DIAGNOSIS — E119 Type 2 diabetes mellitus without complications: Secondary | ICD-10-CM

## 2017-03-01 NOTE — Telephone Encounter (Signed)
lipitor not due until Feb 2019

## 2017-03-06 ENCOUNTER — Institutional Professional Consult (permissible substitution): Payer: Medicare Other | Admitting: Internal Medicine

## 2017-03-06 ENCOUNTER — Telehealth: Payer: Self-pay | Admitting: Internal Medicine

## 2017-03-06 NOTE — Telephone Encounter (Signed)
L mom to r/s appointment from [place].

## 2017-03-07 ENCOUNTER — Encounter: Payer: Self-pay | Admitting: Nurse Practitioner

## 2017-03-08 ENCOUNTER — Ambulatory Visit
Admission: RE | Admit: 2017-03-08 | Discharge: 2017-03-08 | Disposition: A | Discharge status: Home / Self Care | Payer: Medicare Other | Source: Ambulatory Visit | Attending: Nurse Practitioner | Admitting: Nurse Practitioner

## 2017-03-08 ENCOUNTER — Inpatient Hospital Stay: Payer: BC Managed Care – PPO | Attending: Nurse Practitioner | Admitting: Nurse Practitioner

## 2017-03-08 DIAGNOSIS — Z87891 Personal history of nicotine dependence: Secondary | ICD-10-CM

## 2017-03-08 DIAGNOSIS — J439 Emphysema, unspecified: Secondary | ICD-10-CM | POA: Diagnosis not present

## 2017-03-08 DIAGNOSIS — Z122 Encounter for screening for malignant neoplasm of respiratory organs: Secondary | ICD-10-CM

## 2017-03-08 DIAGNOSIS — F17210 Nicotine dependence, cigarettes, uncomplicated: Secondary | ICD-10-CM

## 2017-03-08 NOTE — Progress Notes (Signed)
In accordance with CMS guidelines, patient has met eligibility criteria including age, absence of signs or symptoms of lung cancer.  Social History   Tobacco Use  . Smoking status: Current Every Day Smoker    Packs/day: 1.00    Years: 41.00    Pack years: 41.00  . Smokeless tobacco: Never Used  Substance Use Topics  . Alcohol use: No    Alcohol/week: 0.0 oz  . Drug use: No     A shared decision-making session was conducted prior to the performance of CT scan. This includes one or more decision aids, includes benefits and harms of screening, follow-up diagnostic testing, over-diagnosis, false positive rate, and total radiation exposure.  Counseling on the importance of adherence to annual lung cancer LDCT screening, impact of co-morbidities, and ability or willingness to undergo diagnosis and treatment is imperative for compliance of the program.  Counseling on the importance of continued smoking cessation for former smokers; the importance of smoking cessation for current smokers, and information about tobacco cessation interventions have been given to patient including [place] and 1800 quit [place] programs.  Written order for lung cancer screening with LDCT has been given to the patient and any and all questions have been answered to the best of my abilities.   Yearly follow up will be coordinated by Burgess Estelle, Thoracic Navigator.  Beckey Rutter, DNP, AGNP-C 03/08/17 11:38 AM

## 2017-03-11 ENCOUNTER — Encounter: Payer: Self-pay | Admitting: *Deleted

## 2017-03-13 ENCOUNTER — Ambulatory Visit (INDEPENDENT_AMBULATORY_CARE_PROVIDER_SITE_OTHER): Payer: BC Managed Care – PPO | Admitting: Internal Medicine

## 2017-03-13 ENCOUNTER — Encounter: Payer: Self-pay | Admitting: Internal Medicine

## 2017-03-13 VITALS — BP 120/82 | HR 56 | Resp 16 | Ht 68.0 in | Wt 197.0 lb

## 2017-03-13 DIAGNOSIS — J449 Chronic obstructive pulmonary disease, unspecified: Secondary | ICD-10-CM

## 2017-03-13 DIAGNOSIS — F17210 Nicotine dependence, cigarettes, uncomplicated: Secondary | ICD-10-CM

## 2017-03-13 DIAGNOSIS — G4719 Other hypersomnia: Secondary | ICD-10-CM | POA: Diagnosis not present

## 2017-03-13 NOTE — Progress Notes (Signed)
[facility] Consultation      Assessment and Plan:  Excessive daytime sleepiness. -Symptoms and signs of obstructive sleep apnea. -We will send for sleep study.  Emphysema with dyspnea on exertion. -Emphysema seen on CT lung cancer screening study.  Mild dyspnea on exertion and nocturnal symptoms, now well controlled with Bevespi inhaler. -Continue Bevespi. - We will send for pulmonary function test.  Nicotine abuse. -Continued smoking of about 1 pack/day, currently contemplating quitting.  Spent greater than 3 minutes in discussion.  Essential hypertension. -Sleep apnea can contribute to hypertension, therefore treatment of the sleep apnea is an important part in hypertension management.  Date: 03/13/2017  MRN# [id number] Zachary Wallace 1955-07-21    Zachary Wallace is a 61 y.o. old male seen in consultation for chief complaint of:    Chief Complaint  Patient presents with  . Advice Only    Referred by Dr. Enid Derry for snoring.  . Snoring    spouse reports restless sleep, apnea, daytime sleepiness.    HPI:   Patient is being evaluated today for excessive daytime sleepiness.  He typically goes to bed around 11 PM, he falls asleep within 30 minutes.  He gets out of bed at 6:30 AM.  His is ESS 14 today.  His routine medications include Lopressor 100 mg twice daily. He is married and has been told that he snores at night, and that he stops breathing at night and then makes a loud snoring noise. He has never been diagnosed with sleep apnea.  He is sleepy during the day, and will sometimes fall asleep at work if he is not active.  No sleep attacks.  He notices that he wakes in the middle of the night with dyspnea, he has been started on bevespi inhaler 2 puffs twice per day. He notes that he gets winded with activity, such as pushing a trash can. 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   He is currently smoking 1 ppd, he has  not quit before.   Images personally reviewed, CT low-dose 03/08/17; mild emphysematous changes, otherwise normal lungs.   PMHX:   Past Medical History:  Diagnosis Date  . COPD (chronic obstructive pulmonary disease) ([place])   . Diabetes mellitus without complication ([place])   . Hyperlipidemia   . Hypertension    Surgical Hx:  Past Surgical History:  Procedure Laterality Date  . HAND SURGERY Left   . KNEE SURGERY Left    Family Hx:  Family History  Problem Relation Age of Onset  . Clotting disorder Mother   . Diabetes Mother   . Hypertension Mother   . Heart attack Father   . Hypertension Sister   . Hypertension Brother   . Vision loss Brother    Social Hx:   Social History   Tobacco Use  . Smoking status: Current Every Day Smoker    Packs/day: 1.00    Years: 41.00    Pack years: 41.00  . Smokeless tobacco: Never Used  Substance Use Topics  . Alcohol use: No    Alcohol/week: 0.0 oz  . Drug use: No   Medication:    Current Outpatient Medications:  .  amLODipine (NORVASC) 10 MG tablet, Take 1 tablet (10 mg total) by mouth daily. Appointment please as soon as possible, Disp: 90 tablet, Rfl: 3 .  aspirin 81 MG tablet, Take 81 mg by mouth., Disp: , Rfl:  .  atorvastatin (  LIPITOR) 20 MG tablet, Take 1 tablet (20 mg total) by mouth at bedtime., Disp: 90 tablet, Rfl: 3 .  glimepiride (AMARYL) 4 MG tablet, TAKE 1 TABLET IN THE MORNING BEFORE BREAKFAST, Disp: 90 tablet, Rfl: 1 .  glucose blood (ONETOUCH VERIO) test strip, , Disp: , Rfl:  .  Glycopyrrolate-Formoterol (BEVESPI AEROSPHERE) 9-4.8 MCG/ACT AERO, Inhale 2 puffs into the lungs 2 (two) times daily. STOP Spiriva, Disp: 1 Inhaler, Rfl: 11 .  linagliptin (TRADJENTA) 5 MG TABS tablet, Take 1 tablet (5 mg total) by mouth daily., Disp: 90 tablet, Rfl: 3 .  losartan-hydrochlorothiazide (HYZAAR) 100-12.5 MG tablet, TAKE 1 TABLET BY MOUTH ONCE DAILY., Disp: 90 tablet, Rfl: 1 .  metoprolol (LOPRESSOR) 100 MG tablet, Take 1  tablet (100 mg total) by mouth 2 (two) times daily., Disp: 180 tablet, Rfl: 3 .  PROAIR HFA 108 (90 Base) MCG/ACT inhaler, INHALE 2 PUFFS BY MOUTH FOUR TIMES DAILY AS NEEDED, Disp: 8.5 g, Rfl: 0 .  sildenafil (VIAGRA) 100 MG tablet, Take 1 tablet (100 mg total) by mouth daily., Disp: 3 tablet, Rfl: 20 .  triamcinolone ointment (KENALOG) 0.5 %, Apply 1 application topically 2 (two) times daily. (too strong for face, underarms, groin), Disp: 15 g, Rfl: 0   Allergies:  Patient has no known allergies.  Review of Systems: Gen:  Denies  fever, sweats, chills HEENT: Denies blurred vision, double vision. bleeds, sore throat Cvc:  No dizziness, chest pain. Resp:   Denies cough or sputum production, shortness of breath Gi: Denies swallowing difficulty, stomach pain. Gu:  Denies bladder incontinence, burning urine Ext:   No Joint pain, stiffness. Skin: No skin rash,  hives  Endoc:  No polyuria, polydipsia. Psych: No depression, insomnia. Other:  All other systems were reviewed with the patient and were negative other that what is mentioned in the HPI.   Physical Examination:   VS: BP 120/82 (BP Location: Left Arm, Cuff Size: Normal)   Pulse (!) 56   Resp 16   Ht 5\' 8"  (1.727 m)   Wt 197 lb (89.4 kg)   SpO2 93%   BMI 29.95 kg/m   General Appearance: No distress  Neuro:without focal findings,  speech normal,  HEENT: PERRLA, EOM intact.  Mallampati 3 Pulmonary: normal breath sounds, No wheezing.  CardiovascularNormal S1,S2.  No m/r/g.   Abdomen: Benign, Soft, non-tender. Renal:  No costovertebral tenderness  GU:  No performed at this time. Endoc: No evident thyromegaly, no signs of acromegaly. Skin:   warm, no rashes, no ecchymosis  Extremities: normal, no cyanosis, clubbing.  Other findings:    LABORATORY PANEL:   CBC No results for input(s): WBC, HGB, HCT, PLT in the last 168  hours. ------------------------------------------------------------------------------------------------------------------  Chemistries  No results for input(s): NA, K, CL, CO2, GLUCOSE, BUN, CREATININE, CALCIUM, MG, AST, ALT, ALKPHOS, BILITOT in the last 168 hours.  Invalid input(s): GFRCGP ------------------------------------------------------------------------------------------------------------------  Cardiac Enzymes No results for input(s): TROPONINI in the last 168 hours. ------------------------------------------------------------  RADIOLOGY:  No results found.     Thank  you for the consultation and for allowing [facility] to assist in the care of your patient. Our recommendations are noted above.  Please contact us if we can be of further service.   Marda Stalker, MD.  Board Certified in Internal Medicine, Pulmonary Medicine, [place], and Sleep Medicine.   Pulmonary and Critical Care Office Number: [phone]  Patricia Pesa, M.D.  Merton Border, M.D  03/13/2017

## 2017-03-13 NOTE — Patient Instructions (Addendum)
Will send for sleep study and lung function test.   --Quitting smoking is the most important thing that you can do for your health.  --Quitting smoking will have greater affect on your health than any medicine that we can give you.    Sleep Apnea Sleep apnea is disorder that affects a person's sleep. A person with sleep apnea has abnormal pauses in their breathing when they sleep. It is hard for them to get a good sleep. This makes a person tired during the day. It also can lead to other physical problems. There are three types of sleep apnea. One type is when breathing stops for a short time because your airway is blocked (obstructive sleep apnea). Another type is when the brain sometimes fails to give the normal signal to breathe to the muscles that control your breathing (central sleep apnea). The third type is a combination of the other two types. HOME CARE   Take all medicine as told by your doctor.  Avoid alcohol, calming medicines (sedatives), and depressant drugs.  Try to lose weight if you are overweight. Talk to your doctor about a healthy weight goal.  Your doctor may have you use a device that helps to open your airway. It can help you get the air that you need. It is called a positive airway pressure (PAP) device.   MAKE SURE YOU:   Understand these instructions.  Will watch your condition.  Will get help right away if you are not doing well or get worse.  It may take approximately 1 month for you to get used to wearing her CPAP every night.  Be sure to work with your machine to get used to it, be patient, it may take time!

## 2017-03-20 ENCOUNTER — Other Ambulatory Visit: Payer: Self-pay | Admitting: Family Medicine

## 2017-03-20 NOTE — Telephone Encounter (Signed)
I prescribed a one year supply of Bevespi in November; he should not need a refill yet Please resolve with pharmacy Thank you

## 2017-03-20 NOTE — Telephone Encounter (Signed)
[facility] and they stated it was an error on their end. They do have rx from Nov with #11 refills. Nothing further is needed

## 2017-03-29 ENCOUNTER — Ambulatory Visit: Payer: Medicare Other | Attending: Internal Medicine

## 2017-03-29 DIAGNOSIS — R0683 Snoring: Secondary | ICD-10-CM | POA: Diagnosis not present

## 2017-03-29 DIAGNOSIS — G4733 Obstructive sleep apnea (adult) (pediatric): Secondary | ICD-10-CM | POA: Diagnosis not present

## 2017-03-29 DIAGNOSIS — G4736 Sleep related hypoventilation in conditions classified elsewhere: Secondary | ICD-10-CM | POA: Insufficient documentation

## 2017-04-03 ENCOUNTER — Telehealth: Payer: Self-pay | Admitting: *Deleted

## 2017-04-03 DIAGNOSIS — G4733 Obstructive sleep apnea (adult) (pediatric): Secondary | ICD-10-CM

## 2017-04-03 NOTE — Telephone Encounter (Signed)
Sleep study positive OSA Sleep related hypoxemia  Recommendations: Sleep study with cpap and 02 titration.

## 2017-04-04 NOTE — Telephone Encounter (Signed)
Patient returning call for results.  Please call.   °

## 2017-04-04 NOTE — Telephone Encounter (Signed)
Patient aware of sleep study results. Orders placed for titration study. Nothing further needed.

## 2017-04-19 ENCOUNTER — Ambulatory Visit: Payer: Medicare Other | Attending: Internal Medicine

## 2017-04-19 DIAGNOSIS — G4733 Obstructive sleep apnea (adult) (pediatric): Secondary | ICD-10-CM | POA: Insufficient documentation

## 2017-04-19 DIAGNOSIS — G4736 Sleep related hypoventilation in conditions classified elsewhere: Secondary | ICD-10-CM | POA: Insufficient documentation

## 2017-04-24 DIAGNOSIS — G4733 Obstructive sleep apnea (adult) (pediatric): Secondary | ICD-10-CM | POA: Diagnosis not present

## 2017-04-26 ENCOUNTER — Telehealth: Payer: Self-pay | Admitting: *Deleted

## 2017-04-26 DIAGNOSIS — G4733 Obstructive sleep apnea (adult) (pediatric): Secondary | ICD-10-CM

## 2017-04-26 NOTE — Telephone Encounter (Signed)
Message left with family member to have patient return call for results.

## 2017-04-30 NOTE — Telephone Encounter (Signed)
Pt aware of results. Orders placed  Nothing further needed. 

## 2017-05-11 NOTE — Progress Notes (Signed)
Closing out old abstraction note from 2017

## 2017-05-11 NOTE — Progress Notes (Signed)
Closing out scanned document note open since  12/2015

## 2017-06-04 ENCOUNTER — Ambulatory Visit: Payer: Medicare Other | Attending: Internal Medicine

## 2017-06-17 NOTE — Progress Notes (Signed)
 This encounter was created in error - please disregard.

## 2017-06-18 ENCOUNTER — Encounter: Payer: BC Managed Care – PPO | Admitting: Internal Medicine

## 2017-06-18 ENCOUNTER — Encounter: Payer: Self-pay | Admitting: Internal Medicine

## 2017-07-02 DIAGNOSIS — N183 Chronic kidney disease, stage 3 (moderate): Secondary | ICD-10-CM | POA: Diagnosis not present

## 2017-07-09 ENCOUNTER — Other Ambulatory Visit: Payer: Self-pay

## 2017-07-09 DIAGNOSIS — J42 Unspecified chronic bronchitis: Secondary | ICD-10-CM

## 2017-07-09 MED ORDER — ALBUTEROL SULFATE HFA 108 (90 BASE) MCG/ACT IN AERS: INHALATION_SPRAY | RESPIRATORY_TRACT | 1 refills | Status: DC

## 2017-07-09 NOTE — Telephone Encounter (Signed)
appt 08/14/17

## 2017-07-21 ENCOUNTER — Encounter: Payer: Self-pay | Admitting: Internal Medicine

## 2017-07-25 ENCOUNTER — Other Ambulatory Visit: Payer: Self-pay | Admitting: Family Medicine

## 2017-08-13 ENCOUNTER — Ambulatory Visit: Payer: BC Managed Care – PPO | Attending: Internal Medicine

## 2017-08-13 DIAGNOSIS — G4719 Other hypersomnia: Secondary | ICD-10-CM

## 2017-08-13 DIAGNOSIS — R05 Cough: Secondary | ICD-10-CM | POA: Diagnosis not present

## 2017-08-13 DIAGNOSIS — R0609 Other forms of dyspnea: Secondary | ICD-10-CM | POA: Insufficient documentation

## 2017-08-13 DIAGNOSIS — J449 Chronic obstructive pulmonary disease, unspecified: Secondary | ICD-10-CM | POA: Insufficient documentation

## 2017-08-13 DIAGNOSIS — F17210 Nicotine dependence, cigarettes, uncomplicated: Secondary | ICD-10-CM | POA: Diagnosis not present

## 2017-08-13 DIAGNOSIS — J4489 Other specified chronic obstructive pulmonary disease: Secondary | ICD-10-CM

## 2017-08-13 MED ORDER — ALBUTEROL SULFATE (2.5 MG/3ML) 0.083% IN NEBU
2.5000 mg | INHALATION_SOLUTION | Freq: Once | RESPIRATORY_TRACT | Status: AC
  Administered 2017-08-13: 2.5 mg via RESPIRATORY_TRACT
  Filled 2017-08-13: qty 3

## 2017-08-14 ENCOUNTER — Encounter: Payer: Self-pay | Admitting: Family Medicine

## 2017-08-14 ENCOUNTER — Ambulatory Visit (INDEPENDENT_AMBULATORY_CARE_PROVIDER_SITE_OTHER): Payer: BC Managed Care – PPO | Admitting: Family Medicine

## 2017-08-14 VITALS — BP 118/74 | HR 62 | Temp 98.1°F | Resp 16 | Ht 68.0 in | Wt 194.4 lb

## 2017-08-14 DIAGNOSIS — E786 Lipoprotein deficiency: Secondary | ICD-10-CM | POA: Diagnosis not present

## 2017-08-14 DIAGNOSIS — R252 Cramp and spasm: Secondary | ICD-10-CM | POA: Diagnosis not present

## 2017-08-14 DIAGNOSIS — N183 Chronic kidney disease, stage 3 unspecified: Secondary | ICD-10-CM

## 2017-08-14 DIAGNOSIS — Z125 Encounter for screening for malignant neoplasm of prostate: Secondary | ICD-10-CM | POA: Diagnosis not present

## 2017-08-14 DIAGNOSIS — I10 Essential (primary) hypertension: Secondary | ICD-10-CM | POA: Diagnosis not present

## 2017-08-14 DIAGNOSIS — J42 Unspecified chronic bronchitis: Secondary | ICD-10-CM | POA: Diagnosis not present

## 2017-08-14 DIAGNOSIS — K625 Hemorrhage of anus and rectum: Secondary | ICD-10-CM

## 2017-08-14 DIAGNOSIS — E1165 Type 2 diabetes mellitus with hyperglycemia: Secondary | ICD-10-CM | POA: Diagnosis not present

## 2017-08-14 DIAGNOSIS — E119 Type 2 diabetes mellitus without complications: Secondary | ICD-10-CM

## 2017-08-14 DIAGNOSIS — Z720 Tobacco use: Secondary | ICD-10-CM | POA: Diagnosis not present

## 2017-08-14 DIAGNOSIS — Z5181 Encounter for therapeutic drug level monitoring: Secondary | ICD-10-CM

## 2017-08-14 MED ORDER — GLIMEPIRIDE 4 MG PO TABS: ORAL_TABLET | ORAL | 1 refills | Status: DC

## 2017-08-14 MED ORDER — METOPROLOL TARTRATE 100 MG PO TABS: 100.0000 mg | ORAL_TABLET | Freq: Two times a day (BID) | ORAL | 3 refills | Status: DC

## 2017-08-14 MED ORDER — LINAGLIPTIN 5 MG PO TABS: 5.0000 mg | ORAL_TABLET | Freq: Every day | ORAL | 3 refills | Status: DC

## 2017-08-14 MED ORDER — LOSARTAN POTASSIUM-HCTZ 100-12.5 MG PO TABS: 1.0000 | ORAL_TABLET | Freq: Every day | ORAL | 1 refills | Status: DC

## 2017-08-14 MED ORDER — ATORVASTATIN CALCIUM 20 MG PO TABS: 20.0000 mg | ORAL_TABLET | Freq: Every day | ORAL | 3 refills | Status: DC

## 2017-08-14 MED ORDER — SILDENAFIL CITRATE 100 MG PO TABS: 100.0000 mg | ORAL_TABLET | Freq: Every day | ORAL | 20 refills | Status: DC

## 2017-08-14 NOTE — Progress Notes (Signed)
BP 118/74   Pulse 62   Temp 98.1 F (36.7 C) (Oral)   Resp 16   Ht 5\' 8"  (1.727 m)   Wt 194 lb 6.4 oz (88.2 kg)   SpO2 95%   BMI 29.56 kg/m    Subjective:    Patient ID: Zachary Wallace, male    DOB: July 12, 1955, 62 y.o.   MRN: [id number]  HPI: Zachary Wallace is a 62 y.o. male  Chief Complaint  Patient presents with  . Follow-up    HPI  Here for f/u No medical excitement since last visit Type 2 diabetes Sometimes get heaviness and cramps in the legs when walking, if he rests then it goes away; sometimes it's at night when sleeping; not always when walking Not checking sugars with my blessing Does get dry mouth; sometimes when coughing hard, he gets spots in front of his eyes; not often; just a good coughing jag; last eye exam UTD; no eye problems; feet, noticed some dark coloaration of toenails right foot more than left  High cholesterol; trying to limit fatty meats; statin; no myalgias; not fasting  HTN; excellent control; working a lot  COPD; pretty well controlled; the pollen didn't really bother him; new inhaler is really helping him; he doesn't wake up in the middle of the night  OSA; using CPAP and getting used to it and it's helping  Smoking; not ready to quit  CKD; good water drinker, no NSAIDs  Depression screen [facility] 2/9 08/14/2017 02/13/2017 10/05/2016 05/16/2016 11/14/2015  Decreased Interest 0 0 0 0 0  Down, Depressed, Hopeless 0 0 0 0 0  PHQ - 2 Score 0 0 0 0 0    Relevant past medical, surgical, family and social history reviewed Past Medical History:  Diagnosis Date  . COPD (chronic obstructive pulmonary disease) ([place])   . Diabetes mellitus without complication ([place])   . Hyperlipidemia   . Hypertension   . OSA (obstructive sleep apnea)    Past Surgical History:  Procedure Laterality Date  . HAND SURGERY Left   . KNEE SURGERY Left    Family History  Problem Relation Age of Onset  . Clotting disorder Mother   . Diabetes Mother   . Hypertension  Mother   . Heart attack Father   . Hypertension Sister   . Hypertension Brother   . Vision loss Brother    Social History   Tobacco Use  . Smoking status: Current Every Day Smoker    Packs/day: 1.00    Years: 41.00    Pack years: 41.00  . Smokeless tobacco: Never Used  Substance Use Topics  . Alcohol use: No    Alcohol/week: 0.0 oz  . Drug use: No    Interim medical history since last visit reviewed. Allergies and medications reviewed  Review of Systems  Cardiovascular: Negative for chest pain.  Gastrointestinal: Positive for blood in stool (little bit of bright red blood just when wiping, not in the stool itself).   Per HPI unless specifically indicated above     Objective:    BP 118/74   Pulse 62   Temp 98.1 F (36.7 C) (Oral)   Resp 16   Ht 5\' 8"  (1.727 m)   Wt 194 lb 6.4 oz (88.2 kg)   SpO2 95%   BMI 29.56 kg/m   Wt Readings from Last 3 Encounters:  08/14/17 194 lb 6.4 oz (88.2 kg)  03/13/17 197 lb (89.4 kg)  03/08/17 198 lb (89.8 kg)  Physical Exam  Constitutional: He appears well-developed and well-nourished. No distress.  HENT:  Head: Normocephalic and atraumatic.  Eyes: EOM are normal. No scleral icterus.  Neck: No thyromegaly present.  Cardiovascular: Normal rate and regular rhythm.  Pulmonary/Chest: Effort normal and breath sounds normal.  Abdominal: Soft. Bowel sounds are normal. He exhibits no distension.  Musculoskeletal: He exhibits no edema.  Neurological: Coordination normal.  Skin: Skin is warm and dry. No pallor.  Psychiatric: He has a normal mood and affect. His behavior is normal. Judgment and thought content normal.   Diabetic Foot Form - Detailed   Diabetic Foot Exam - detailed Diabetic Foot exam was performed with the following findings:  Yes 08/14/2017  8:53 AM  Visual Foot Exam completed.:  Yes  Pulse Foot Exam completed.:  Yes  Right Dorsalis Pedis:  Present, Diminished Left Dorsalis Pedis:  Present, Diminished  Sensory  Foot Exam Completed.:  Yes Semmes-Weinstein Monofilament Test R Site 1-Great Toe:  Pos L Site 1-Great Toe:  Pos        Results for orders placed or performed in visit on 02/13/17  COMPLETE METABOLIC PANEL WITH GFR  Result Value Ref Range   Glucose, Bld 156 (H) 65 - 99 mg/dL   BUN 15 7 - 25 mg/dL   Creat 2.17 (H) 0.70 - 1.33 mg/dL   GFR, Est Non African American 32 (L) > OR = 60 mL/min/1.81m2   GFR, Est African American 37 (L) > OR = 60 mL/min/1.63m2   BUN/Creatinine Ratio 7 6 - 22 (calc)   Sodium 137 135 - 146 mmol/L   Potassium 3.9 3.5 - 5.3 mmol/L   Chloride 101 98 - 110 mmol/L   CO2 30 20 - 32 mmol/L   Calcium 9.5 8.6 - 10.3 mg/dL   Total Protein 7.6 6.1 - 8.1 g/dL   Albumin 4.6 3.6 - 5.1 g/dL   Globulin 3.0 1.9 - 3.7 g/dL (calc)   AG Ratio 1.5 1.0 - 2.5 (calc)   Total Bilirubin 0.4 0.2 - 1.2 mg/dL   Alkaline phosphatase (APISO) 102 40 - 115 U/L   AST 20 10 - 35 U/L   ALT 15 9 - 46 U/L  Hemoglobin A1c  Result Value Ref Range   Hgb A1c MFr Bld 6.7 (H) <5.7 % of total Hgb   Mean Plasma Glucose 146 (calc)   eAG (mmol/L) 8.1 (calc)  Lipid panel  Result Value Ref Range   Cholesterol 96 <200 mg/dL   HDL 31 (L) >40 mg/dL   Triglycerides 249 (H) <150 mg/dL   LDL Cholesterol (Calc) 36 mg/dL (calc)   Total CHOL/HDL Ratio 3.1 <5.0 (calc)   Non-HDL Cholesterol (Calc) 65 <130 mg/dL (calc)      Assessment & Plan:   Problem List Items Addressed This Visit      Cardiovascular and Mediastinum   BP (high blood pressure) (Chronic)    Well-controlled, try DASH guidelines      Relevant Medications   atorvastatin (LIPITOR) 20 MG tablet   losartan-hydrochlorothiazide (HYZAAR) 100-12.5 MG tablet   metoprolol tartrate (LOPRESSOR) 100 MG tablet   sildenafil (VIAGRA) 100 MG tablet     Respiratory   COPD (chronic obstructive pulmonary disease) (HCC) (Chronic)    Improved with new inhaler        Endocrine   Diabetes mellitus ([facility]) - Primary (Chronic)    Check A1c and urine  today; foot exam by MD; try to limit sugars; not checking sugars with my blessing  Relevant Medications   atorvastatin (LIPITOR) 20 MG tablet   glimepiride (AMARYL) 4 MG tablet   linagliptin (TRADJENTA) 5 MG TABS tablet   losartan-hydrochlorothiazide (HYZAAR) 100-12.5 MG tablet   Other Relevant Orders   Hemoglobin A1C   Urine Microalbumin w/creat. ratio   US ARTERIAL ABI (SCREENING LOWER EXTREMITY)     Genitourinary   Chronic kidney disease (CKD), stage III (moderate) (HCC) (Chronic)    Check renal function; avoid NSAID        Other   Screening for prostate cancer   Relevant Orders   PSA   Tobacco abuse    Not ready to quit and I admire his honesty; I am here if/when ready to quit      Relevant Orders   US ARTERIAL ABI (SCREENING LOWER EXTREMITY)   Medication monitoring encounter    Check renal and hepatic function      Relevant Orders   COMPLETE METABOLIC PANEL WITH GFR   Low HDL (under 40)    Check cholesterol      Relevant Orders   Lipid panel    Other Visit Diagnoses    BRBPR (bright red blood per rectum)       patient politely declined offer to see GI for eval, possible hemorrhoid bnading; he'll try metamucil for one month, call if still bleeding   Relevant Orders   CBC with Differential/Platelet   Leg cramp       Relevant Orders   US ARTERIAL ABI (SCREENING LOWER EXTREMITY)   Screening PSA (prostate specific antigen)       Relevant Orders   PSA       Follow up plan: Return in about 6 months (around 02/14/2018) for follow-up visit with Dr. Sanda Klein; CPE when due soon.  An after-visit summary was printed and given to the patient at [place].  Please see the patient instructions which may contain other information and recommendations beyond what is mentioned above in the assessment and plan.  Meds ordered this encounter  Medications  . atorvastatin (LIPITOR) 20 MG tablet    Sig: Take 1 tablet (20 mg total) by mouth at bedtime.    Dispense:  90  tablet    Refill:  3  . glimepiride (AMARYL) 4 MG tablet    Sig: TAKE 1 TABLET IN THE MORNING BEFORE BREAKFAST    Dispense:  90 tablet    Refill:  1  . linagliptin (TRADJENTA) 5 MG TABS tablet    Sig: Take 1 tablet (5 mg total) by mouth daily.    Dispense:  90 tablet    Refill:  3  . losartan-hydrochlorothiazide (HYZAAR) 100-12.5 MG tablet    Sig: Take 1 tablet by mouth daily.    Dispense:  90 tablet    Refill:  1  . metoprolol tartrate (LOPRESSOR) 100 MG tablet    Sig: Take 1 tablet (100 mg total) by mouth 2 (two) times daily.    Dispense:  180 tablet    Refill:  3  . sildenafil (VIAGRA) 100 MG tablet    Sig: Take 1 tablet (100 mg total) by mouth daily.    Dispense:  3 tablet    Refill:  20    Orders Placed This Encounter  Procedures  . US ARTERIAL ABI (SCREENING LOWER EXTREMITY)  . Hemoglobin A1C  . Urine Microalbumin w/creat. ratio  . COMPLETE METABOLIC PANEL WITH GFR  . Lipid panel  . CBC with Differential/Platelet  . PSA

## 2017-08-14 NOTE — Assessment & Plan Note (Signed)
Not ready to quit and I admire his honesty; I am here if/when ready to quit

## 2017-08-14 NOTE — Assessment & Plan Note (Signed)
Check cholesterol.

## 2017-08-14 NOTE — Patient Instructions (Addendum)
Please call [phone] to schedule your imaging test Please wait 2-3 days after the order has been placed to call and get your test scheduled  I do encourage you to quit smoking Call [phone] to sign up for smoking cessation classes You can call 1-800-QUIT-NOW to talk with a smoking cessation coach  Try to follow the DASH guidelines (DASH stands for Dietary Approaches to Stop Hypertension). Try to limit the sodium in your diet to no more than 1,500mg  of sodium per day. Certainly try to not exceed 2,000 mg per day at the very most. Do not add salt when cooking or at the table.  Check the sodium amount on labels when shopping, and choose items lower in sodium when given a choice. Avoid or limit foods that already contain a lot of sodium. Eat a diet rich in fruits and vegetables and whole grains, and try to lose weight if overweight or obese  Chronic Obstructive Pulmonary Disease Chronic obstructive pulmonary disease (COPD) is a long-term (chronic) lung problem. When you have COPD, it is hard for air to get in and out of your lungs. The way your lungs work will never return to normal. Usually the condition gets worse over time. There are things you can do to keep yourself as healthy as possible. Your doctor may treat your condition with:  Medicines.  Quitting smoking, if you smoke.  Rehabilitation. This may involve a team of specialists.  Oxygen.  Exercise and changes to your diet.  Lung surgery.  Comfort measures (palliative care).  Follow these instructions at home: Medicines  Take over-the-counter and prescription medicines only as told by your doctor.  Talk to your doctor before taking any cough or allergy medicines. You may need to avoid medicines that cause your lungs to be dry. Lifestyle  If you smoke, stop. Smoking makes the problem worse. If you need help quitting, ask your doctor.  Avoid being around things that make your breathing worse. This may include smoke,  chemicals, and fumes.  Stay active, but remember to also rest.  Learn and use tips on how to relax.  Make sure you get enough sleep. Most adults need at least 7 hours a night.  Eat healthy foods. Eat smaller meals more often. Rest before meals. Controlled breathing  Learn and use tips on how to control your breathing as told by your doctor. Try: ? Breathing in (inhaling) through your nose for 1 second. Then, pucker your lips and breath out (exhale) through your lips for 2 seconds. ? Putting one hand on your belly (abdomen). Breathe in slowly through your nose for 1 second. Your hand on your belly should move out. Pucker your lips and breathe out slowly through your lips. Your hand on your belly should move in as you breathe out. Controlled coughing  Learn and use controlled coughing to clear mucus from your lungs. The steps are: 1. Lean your head a little forward. 2. Breathe in deeply. 3. Try to hold your breath for 3 seconds. 4. Keep your mouth slightly open while coughing 2 times. 5. Spit any mucus out into a tissue. 6. Rest and do the steps again 1 or 2 times as needed. General instructions  Make sure you get all the shots (vaccines) that your doctor recommends. Ask your doctor about a flu shot and a pneumonia shot.  Use oxygen therapy and therapy to help improve your lungs (pulmonary rehabilitation) if told by your doctor. If you need home oxygen therapy, ask your doctor  if you should buy a tool to measure your oxygen level (oximeter).  Make a COPD action plan with your doctor. This helps you know what to do if you feel worse than usual.  Manage any other conditions you have as told by your doctor.  Avoid going outside when it is very hot, cold, or humid.  Avoid people who have a sickness you can catch (contagious).  Keep all follow-up visits as told by your doctor. This is important. Contact a doctor if:  You cough up more mucus than usual.  There is a change in the  color or thickness of the mucus.  It is harder to breathe than usual.  Your breathing is faster than usual.  You have trouble sleeping.  You need to use your medicines more often than usual.  You have trouble doing your normal activities such as getting dressed or walking around the house. Get help right away if:  You have shortness of breath while resting.  You have shortness of breath that stops you from: ? Being able to talk. ? Doing normal activities.  Your chest hurts for longer than 5 minutes.  Your skin color is more blue than usual.  Your pulse oximeter shows that you have low oxygen for longer than 5 minutes.  You have a fever.  You feel too tired to breathe normally. Summary  Chronic obstructive pulmonary disease (COPD) is a long-term lung problem.  The way your lungs work will never return to normal. Usually the condition gets worse over time. There are things you can do to keep yourself as healthy as possible.  Take over-the-counter and prescription medicines only as told by your doctor.  If you smoke, stop. Smoking makes the problem worse. This information is not intended to replace advice given to you by your health care provider. Make sure you discuss any questions you have with your health care provider. Document Released: 08/29/2007 Document Revised: 08/18/2015 Document Reviewed: 11/06/2012 Elsevier Interactive Patient Education  2017 [employer].

## 2017-08-14 NOTE — Assessment & Plan Note (Signed)
Check A1c and urine today; foot exam by MD; try to limit sugars; not checking sugars with my blessing

## 2017-08-14 NOTE — Assessment & Plan Note (Signed)
Improved with new inhaler

## 2017-08-14 NOTE — Assessment & Plan Note (Signed)
Check renal function; avoid NSAID

## 2017-08-14 NOTE — Assessment & Plan Note (Signed)
Check renal and hepatic function 

## 2017-08-14 NOTE — Assessment & Plan Note (Signed)
Well-controlled, try DASH guidelines

## 2017-08-15 LAB — CBC WITH DIFFERENTIAL/PLATELET
BASOS ABS: 40 {cells}/uL (ref 0–200)
Basophils Relative: 0.7 %
EOS ABS: 160 {cells}/uL (ref 15–500)
Eosinophils Relative: 2.8 %
HEMATOCRIT: 40.4 % (ref 38.5–50.0)
HEMOGLOBIN: 13.8 g/dL (ref 13.2–17.1)
LYMPHS ABS: 1425 {cells}/uL (ref 850–3900)
MCH: 29.7 pg (ref 27.0–33.0)
MCHC: 34.2 g/dL (ref 32.0–36.0)
MCV: 87.1 fL (ref 80.0–100.0)
MPV: 11.8 fL (ref 7.5–12.5)
Monocytes Relative: 9.1 %
NEUTROS ABS: 3557 {cells}/uL (ref 1500–7800)
NEUTROS PCT: 62.4 %
Platelets: 181 10*3/uL (ref 140–400)
RBC: 4.64 10*6/uL (ref 4.20–5.80)
RDW: 12.3 % (ref 11.0–15.0)
Total Lymphocyte: 25 %
WBC: 5.7 10*3/uL (ref 3.8–10.8)
WBCMIX: 519 {cells}/uL (ref 200–950)

## 2017-08-15 LAB — COMPLETE METABOLIC PANEL WITH GFR
AG RATIO: 1.7 (calc) (ref 1.0–2.5)
ALT: 15 U/L (ref 9–46)
AST: 18 U/L (ref 10–35)
Albumin: 4.6 g/dL (ref 3.6–5.1)
Alkaline phosphatase (APISO): 106 U/L (ref 40–115)
BILIRUBIN TOTAL: 0.6 mg/dL (ref 0.2–1.2)
BUN/Creatinine Ratio: 8 (calc) (ref 6–22)
BUN: 13 mg/dL (ref 7–25)
CALCIUM: 9.3 mg/dL (ref 8.6–10.3)
CHLORIDE: 98 mmol/L (ref 98–110)
CO2: 32 mmol/L (ref 20–32)
Creat: 1.73 mg/dL — ABNORMAL HIGH (ref 0.70–1.33)
GFR, EST AFRICAN AMERICAN: 49 mL/min/{1.73_m2} — AB (ref >=60)
GFR, EST NON AFRICAN AMERICAN: 42 mL/min/{1.73_m2} — AB (ref >=60)
GLOBULIN: 2.7 g/dL (ref 1.9–3.7)
Glucose, Bld: 214 mg/dL — ABNORMAL HIGH (ref 65–99)
POTASSIUM: 3.9 mmol/L (ref 3.5–5.3)
SODIUM: 137 mmol/L (ref 135–146)
Total Protein: 7.3 g/dL (ref 6.1–8.1)

## 2017-08-15 LAB — HEMOGLOBIN A1C
Hgb A1c MFr Bld: 10.1 % of total Hgb — ABNORMAL HIGH (ref <5.7)
Mean Plasma Glucose: 243 (calc)
eAG (mmol/L): 13.5 (calc)

## 2017-08-15 LAB — PSA: PSA: 0.8 ng/mL (ref <=4.0)

## 2017-08-15 LAB — MICROALBUMIN / CREATININE URINE RATIO
Creatinine, Urine: 81 mg/dL (ref 20–320)
MICROALB UR: 0.8 mg/dL
MICROALB/CREAT RATIO: 10 ug/mg{creat} (ref <30)

## 2017-08-15 LAB — LIPID PANEL
Cholesterol: 91 mg/dL (ref <200)
HDL: 31 mg/dL — ABNORMAL LOW (ref >40)
LDL Cholesterol (Calc): 29 mg/dL (calc)
Non-HDL Cholesterol (Calc): 60 mg/dL (calc) (ref <130)
Total CHOL/HDL Ratio: 2.9 (calc) (ref <5.0)
Triglycerides: 246 mg/dL — ABNORMAL HIGH (ref <150)

## 2017-08-16 ENCOUNTER — Telehealth: Payer: Self-pay

## 2017-08-16 NOTE — Telephone Encounter (Signed)
Called left voicemail that it was really important for him to come in today that his sugar was really high and you needed to start him on insulin.

## 2017-08-16 NOTE — Telephone Encounter (Signed)
Yes, I have called back to let him know it was for starting medication insulin, left detailed voicemail

## 2017-08-16 NOTE — Telephone Encounter (Signed)
Copied from [place] [phone]. Topic: General - Other >> Aug 16, 2017  8:06 AM Aurelio Brash B wrote: Reason for CRM:  PT states he was told to come in to get blood sugar checked today  but he can not come in

## 2017-08-16 NOTE — Telephone Encounter (Signed)
I don't need to have his sugar checked I need to start him on insulin Please contact him again and encourage him to come today (long holiday weekend); if absolutely cannot come, then we'll see him Tuesday Thank you

## 2017-08-16 NOTE — Telephone Encounter (Signed)
Copied from [place] [phone]. Topic: General - Other >> Aug 16, 2017  8:06 AM Aurelio Brash B wrote: Reason for CRM:  PT states he was told to come in to get blood sugar checked today  but he can not come in

## 2017-08-22 ENCOUNTER — Encounter: Payer: Self-pay | Admitting: Internal Medicine

## 2017-08-22 ENCOUNTER — Encounter: Payer: Self-pay | Admitting: *Deleted

## 2017-08-22 ENCOUNTER — Ambulatory Visit (INDEPENDENT_AMBULATORY_CARE_PROVIDER_SITE_OTHER): Payer: BC Managed Care – PPO | Admitting: Internal Medicine

## 2017-08-22 VITALS — BP 118/78 | HR 58 | Resp 16 | Ht 68.0 in | Wt 192.0 lb

## 2017-08-22 DIAGNOSIS — F17210 Nicotine dependence, cigarettes, uncomplicated: Secondary | ICD-10-CM | POA: Diagnosis not present

## 2017-08-22 DIAGNOSIS — G4733 Obstructive sleep apnea (adult) (pediatric): Secondary | ICD-10-CM

## 2017-08-22 DIAGNOSIS — J449 Chronic obstructive pulmonary disease, unspecified: Secondary | ICD-10-CM

## 2017-08-22 NOTE — Progress Notes (Signed)
[facility] Consultation      Assessment and Plan:  Obstructive sleep apnea. - Obstructive sleep apnea, doing well with CPAP. - Continue to use CPAP every night.  Emphysema with dyspnea on exertion. -Very severe emphysema with FEV1 of 20%.  Surprisingly patient is not very symptomatic from this. -Continue Bevespi. - We will send for alpha-1 testing.  Nicotine abuse. -Continued smoking of about 1 pack/day, currently contemplating quitting.  Spent greater than 3 minutes in discussion of smoking cessation, to clean light of his severe emphysema.  Patient is not really considering quitting yet  Essential hypertension. -Sleep apnea can contribute to hypertension, therefore treatment of the sleep apnea is an important part in hypertension management.  Date: 08/22/2017  MRN# [id number] Zachary Wallace [birth year]    Zachary Wallace is a 62 y.o. old male seen in consultation for chief complaint of:    Chief Complaint  Patient presents with  . COPD    c/o wheezing  . Sleep Apnea    pt wears bipap at least 4-6 hours. He says his nose gets stopped up.    HPI:   Patient is a 62 year old male with obstructive sleep apnea, severe COPD. He has been using the CPAP every night and feels more awake since using it.   He is continues smoking 1 ppd, he has not quit before. He is not really thinking of quitting. He is not as winded as his PFT would suggest. 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  He is using bevespi 2 puffs daily, and feels that it is helping. He takes the albuterol a few times per week.   **I personally reviewed download data 30 days as of 07/20/2017.  Usage greater than 4 hours is 22/26 days.  Average usage on days used is 7 hours 1 minute.  Residual AHI is less than 5. **Sleep study, bilevel titration 04/19/17; recommend bilevel ST minimum EPAP 13, maximum EPAP 17, pressure support 5, backup  rate 8 ** CT low-dose 03/08/17; mild emphysematous changes, otherwise normal lungs. **PFT 08/13/2017; tracings personally reviewed FVC 54% predicted, FEV1 20% predicted, there is significant improvement with bronchodilator flow volume loop is obstructed. TLC 95% predicted, RV to TLC ratio was significantly elevated suggesting air trapping.  Diffusion capacity is 44% predicted - Overall this test shows very severe COPD with an FEV1 of 20%, significant reversibility, severe air trapping, severely reduced diffusion capacity.  Social Hx:   Social History   Tobacco Use  . Smoking status: Current Every Day Smoker    Packs/day: 1.00    Years: 41.00    Pack years: 41.00  . Smokeless tobacco: Never Used  Substance Use Topics  . Alcohol use: No    Alcohol/week: 0.0 oz  . Drug use: No   Medication:    Current Outpatient Medications:  .  albuterol (PROAIR HFA) 108 (90 Base) MCG/ACT inhaler, INHALE 2 PUFFS BY MOUTH FOUR TIMES DAILY AS NEEDED, Disp: 8.5 g, Rfl: 1 .  amLODipine (NORVASC) 10 MG tablet, TAKE (1) TABLET BY MOUTH DAILY FOR HIGH BLOOD PRESSURE., Disp: 90 tablet, Rfl: 0 .  aspirin 81 MG tablet, Take 81 mg by mouth., Disp: , Rfl:  .  atorvastatin (LIPITOR) 20 MG tablet, Take 1 tablet (20 mg total) by mouth at bedtime., Disp: 90 tablet, Rfl: 3 .  glimepiride (AMARYL) 4 MG tablet, TAKE 1 TABLET IN THE MORNING BEFORE BREAKFAST,  Disp: 90 tablet, Rfl: 1 .  glucose blood (ONETOUCH VERIO) test strip, , Disp: , Rfl:  .  Glycopyrrolate-Formoterol (BEVESPI AEROSPHERE) 9-4.8 MCG/ACT AERO, Inhale 2 puffs into the lungs 2 (two) times daily. STOP Spiriva, Disp: 1 Inhaler, Rfl: 11 .  linagliptin (TRADJENTA) 5 MG TABS tablet, Take 1 tablet (5 mg total) by mouth daily., Disp: 90 tablet, Rfl: 3 .  losartan-hydrochlorothiazide (HYZAAR) 100-12.5 MG tablet, Take 1 tablet by mouth daily., Disp: 90 tablet, Rfl: 1 .  metoprolol tartrate (LOPRESSOR) 100 MG tablet, Take 1 tablet (100 mg total) by mouth 2 (two) times  daily., Disp: 180 tablet, Rfl: 3 .  sildenafil (VIAGRA) 100 MG tablet, Take 1 tablet (100 mg total) by mouth daily., Disp: 3 tablet, Rfl: 20   Allergies:  Patient has no known allergies.  Review of Systems: Gen:  Denies  fever, sweats, chills HEENT: Denies blurred vision, double vision. bleeds, sore throat Cvc:  No dizziness, chest pain. Resp:   Denies cough or sputum production, shortness of breath Gi: Denies swallowing difficulty, stomach pain. Gu:  Denies bladder incontinence, burning urine Ext:   No Joint pain, stiffness. Skin: No skin rash,  hives  Endoc:  No polyuria, polydipsia. Psych: No depression, insomnia. Other:  All other systems were reviewed with the patient and were negative other that what is mentioned in the HPI.   Physical Examination:   VS: BP 118/78 (BP Location: Left Arm, Cuff Size: Normal)   Pulse (!) 58   Resp 16   Ht 5\' 8"  (1.727 m)   Wt 192 lb (87.1 kg)   SpO2 100%   BMI 29.19 kg/m   General Appearance: No distress  Neuro:without focal findings,  speech normal,  HEENT: PERRLA, EOM intact.  Mallampati 3 Pulmonary: normal breath sounds, No wheezing.  deCreased air entry bilaterally CardiovascularNormal S1,S2.  No m/r/g.   Abdomen: Benign, Soft, non-tender. Renal:  No costovertebral tenderness  GU:  No performed at this time. Endoc: No evident thyromegaly, no signs of acromegaly. Skin:   warm, no rashes, no ecchymosis  Extremities: normal, no cyanosis, clubbing.  Other findings:    LABORATORY PANEL:   CBC No results for input(s): WBC, HGB, HCT, PLT in the last 168 hours. ------------------------------------------------------------------------------------------------------------------  Chemistries  No results for input(s): NA, K, CL, CO2, GLUCOSE, BUN, CREATININE, CALCIUM, MG, AST, ALT, ALKPHOS, BILITOT in the last 168 hours.  Invalid input(s):  GFRCGP ------------------------------------------------------------------------------------------------------------------  Cardiac Enzymes No results for input(s): TROPONINI in the last 168 hours. ------------------------------------------------------------  RADIOLOGY:  No results found.     Thank  you for the consultation and for allowing [facility] to assist in the care of your patient. Our recommendations are noted above.  Please contact us if we can be of further service.   Marda Stalker, MD.  Board Certified in Internal Medicine, Pulmonary Medicine, [place], and Sleep Medicine.  [facility] Office Number: [phone]  Patricia Pesa, M.D.  Merton Border, M.D  08/22/2017

## 2017-08-22 NOTE — Patient Instructions (Addendum)
--  Quitting smoking is the most important thing that you can do for your health.  --Quitting smoking will have greater affect on your health than any medicine that we can give you.   Continue using CPAP every night.  Continue using bevespi.  Will check alpha-1.

## 2017-09-27 ENCOUNTER — Encounter: Payer: Self-pay | Admitting: Family Medicine

## 2017-09-27 ENCOUNTER — Ambulatory Visit: Payer: BC Managed Care – PPO | Admitting: Family Medicine

## 2017-09-27 ENCOUNTER — Other Ambulatory Visit: Payer: Self-pay | Admitting: Family Medicine

## 2017-09-27 VITALS — BP 132/78 | HR 68 | Temp 98.5°F | Resp 18 | Ht 68.0 in | Wt 195.8 lb

## 2017-09-27 DIAGNOSIS — L568 Other specified acute skin changes due to ultraviolet radiation: Secondary | ICD-10-CM | POA: Diagnosis not present

## 2017-09-27 DIAGNOSIS — E1165 Type 2 diabetes mellitus with hyperglycemia: Secondary | ICD-10-CM

## 2017-09-27 MED ORDER — PIOGLITAZONE HCL 15 MG PO TABS: 15.0000 mg | ORAL_TABLET | Freq: Every day | ORAL | 0 refills | Status: DC

## 2017-09-27 NOTE — Patient Instructions (Signed)
STOP Amaryl (Glimeperide) until your follow up appointment.    Please apply SPF 30-50 Sunscreen to your arms, neck, face, and any other body part that is going to be exposed to the sun.   Follow up in 2-3 weeks.

## 2017-09-27 NOTE — Progress Notes (Signed)
Error

## 2017-09-27 NOTE — Progress Notes (Addendum)
Name: Zachary Wallace   MRN: [id number]    DOB: 01-10-1956   Date:09/27/2017       Progress Note  Subjective  Chief Complaint  Chief Complaint  Patient presents with  . Rash    on arms for 1 week    HPI  PT presents with concern for rash on bilateral lower arms x1 week.  He has tried benadryl without relief, and Lotrimin - has been applying daily for a few days without relief.  Rash is not painful or itchy.  PT is diabetic and takes Amaryl which has been well tolerated in the past.  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   Patient Active Problem List   Diagnosis Date Noted  . Snoring 02/13/2017  . Low HDL (under 40) 02/13/2017  . Screening for prostate cancer 05/16/2016  . Tobacco abuse 05/16/2016  . High triglycerides 05/16/2016  . Medication monitoring encounter 08/16/2015  . COPD (chronic obstructive pulmonary disease) ([place]) 02/10/2015  . Lumbar radiculopathy 01/10/2015  . Left hip pain 01/10/2015  . Abnormal presence of protein in urine 10/06/2013  . Avitaminosis D 10/11/2011  . Diabetes mellitus ([place]) 10/11/2011  . Chronic kidney disease (CKD), stage III (moderate) ([place]) 08/15/2010  . 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 BP (high blood pressure) 08/15/2010  . Epilepsy ([place]) 08/15/2010    Social History   Tobacco Use  . Smoking status: Current Every Day Smoker    Packs/day: 1.00    Years: 41.00    Pack years: 41.00  . Smokeless tobacco: Never Used  Substance Use Topics  . Alcohol use: No    Alcohol/week: 0.0 oz     Current Outpatient Medications:  .  albuterol (PROAIR HFA) 108 (90 Base) MCG/ACT inhaler, INHALE 2 PUFFS BY MOUTH FOUR TIMES DAILY AS NEEDED, Disp: 8.5 g, Rfl: 1 .  amLODipine (NORVASC) 10 MG tablet, TAKE (1) TABLET BY MOUTH DAILY FOR HIGH BLOOD PRESSURE., Disp: 90 tablet, Rfl: 0 .  aspirin 81 MG tablet, Take 81 mg by mouth., Disp: , Rfl:  .  atorvastatin (LIPITOR) 20 MG tablet, Take 1 tablet (20 mg total) by mouth at bedtime.,  Disp: 90 tablet, Rfl: 3 .  glimepiride (AMARYL) 4 MG tablet, TAKE 1 TABLET IN THE MORNING BEFORE BREAKFAST, Disp: 90 tablet, Rfl: 1 .  glucose blood (ONETOUCH VERIO) test strip, , Disp: , Rfl:  .  Glycopyrrolate-Formoterol (BEVESPI AEROSPHERE) 9-4.8 MCG/ACT AERO, Inhale 2 puffs into the lungs 2 (two) times daily. STOP Spiriva, Disp: 1 Inhaler, Rfl: 11 .  linagliptin (TRADJENTA) 5 MG TABS tablet, Take 1 tablet (5 mg total) by mouth daily., Disp: 90 tablet, Rfl: 3 .  losartan-hydrochlorothiazide (HYZAAR) 100-12.5 MG tablet, Take 1 tablet by mouth daily., Disp: 90 tablet, Rfl: 1 .  metoprolol tartrate (LOPRESSOR) 100 MG tablet, Take 1 tablet (100 mg total) by mouth 2 (two) times daily., Disp: 180 tablet, Rfl: 3 .  sildenafil (VIAGRA) 100 MG tablet, Take 1 tablet (100 mg total) by mouth daily., Disp: 3 tablet, Rfl: 20  No Known Allergies  ROS  Constitutional: Negative for fever or weight change.  Respiratory: Negative for cough and shortness of breath.   Cardiovascular: Negative for chest pain or palpitations.  Gastrointestinal: Negative for abdominal pain, no bowel changes.  Musculoskeletal: Negative for gait problem or joint swelling.  Skin: Positive for rash.  Neurological: Negative for dizziness or headache.  No other specific complaints in a complete review of systems (except as listed in  HPI above).  Objective  Vitals:   09/27/17 1249  BP: 132/78  Pulse: 68  Resp: 18  Temp: 98.5 F (36.9 C)  TempSrc: Oral  SpO2: 94%  Weight: 195 lb 12.8 oz (88.8 kg)  Height: 5\' 8"  (1.727 m)   Body mass index is 29.77 kg/m.  Nursing Note and Vital Signs reviewed.  Physical Exam  Constitutional: Patient appears well-developed and well-nourished. Obese No distress.  HEENT: head atraumatic, normocephalic Cardiovascular: Normal rate, regular rhythm, S1/S2 present.  No murmur or rub heard. No BLE edema. Pulmonary/Chest: Effort normal and breath sounds clear. No respiratory distress or  retractions. Skin: Lesions appear as raised wheels that are slightly erythematous - 13 on RIGHT UE and 17 on the LEFT UE, single lesion to the posterior LEFT neck. Non-tender, no exudate, no fluctuation.  Lesions appear to only be in areas of skin that are darker/appear to have been sun exposed. Psychiatric: Patient has a normal mood and affect. behavior is normal. Judgment and thought content normal.  No results found for this or any previous visit (from the past 72 hour(s)).  Assessment & Plan  1. Photosensitivity dermatitis - Sunscreen SPF 30-50, STOP glimepiride as this can cause photosensitivity.  Follow up in 2 weeks with PCP Dr. Sanda Klein to re-evaluate.  2. Type 2 diabetes mellitus with hyperglycemia, without long-term current use of insulin (HCC) - Stop amaryl as I suspect this to be the possible cause of this photosensitivity rash.  We will replace with Actos until follow up with PCP. - pioglitazone (ACTOS) 15 MG tablet; Take 1 tablet (15 mg total) by mouth daily.  Dispense: 30 tablet; Refill: 0  -Red flags and when to present for emergency care or RTC including fever >101.62F, chest pain, shortness of breath, new/worsening/un-resolving symptoms, oral swelling reviewed with patient at time of visit. Follow up and care instructions discussed and provided in AVS.

## 2017-09-27 NOTE — Addendum Note (Signed)
Addended by: Hubbard Hartshorn on: 09/27/2017 01:29 PM   Modules accepted: Orders

## 2017-10-11 ENCOUNTER — Encounter: Payer: Self-pay | Admitting: Family Medicine

## 2017-10-11 ENCOUNTER — Ambulatory Visit: Payer: BC Managed Care – PPO | Admitting: Family Medicine

## 2017-10-11 VITALS — BP 122/74 | HR 70 | Temp 98.3°F | Resp 14 | Ht 68.0 in | Wt 193.0 lb

## 2017-10-11 DIAGNOSIS — J42 Unspecified chronic bronchitis: Secondary | ICD-10-CM | POA: Diagnosis not present

## 2017-10-11 DIAGNOSIS — E1165 Type 2 diabetes mellitus with hyperglycemia: Secondary | ICD-10-CM | POA: Diagnosis not present

## 2017-10-11 DIAGNOSIS — L568 Other specified acute skin changes due to ultraviolet radiation: Secondary | ICD-10-CM | POA: Diagnosis not present

## 2017-10-11 MED ORDER — GLYCOPYRROLATE-FORMOTEROL 9-4.8 MCG/ACT IN AERO: 2.0000 | INHALATION_SPRAY | Freq: Two times a day (BID) | RESPIRATORY_TRACT | 11 refills | Status: DC

## 2017-10-11 MED ORDER — ALBUTEROL SULFATE HFA 108 (90 BASE) MCG/ACT IN AERS: INHALATION_SPRAY | RESPIRATORY_TRACT | 2 refills | Status: DC

## 2017-10-11 NOTE — Progress Notes (Signed)
BP 122/74   Pulse 70   Temp 98.3 F (36.8 C) (Oral)   Resp 14   Ht 5\' 8"  (1.727 m)   Wt 193 lb (87.5 kg)   SpO2 93%   BMI 29.35 kg/m    Subjective:    Patient ID: Zachary Wallace, male    DOB: 1955-06-23, 62 y.o.   MRN: [id number]  HPI: Zachary Wallace is a 62 y.o. male  Chief Complaint  Patient presents with  . Follow-up    bitlateral rash on arms    HPI Patient is here for f/u on the rash on his arms Rash is getting better Wearing sunscreen; Coppertone 30 SPF Rash never spread outside of the arms where sun exposed  No low sugars No dry mouth, no blurred vision Not checking sugars Feet are okay  He is breathing okay; using inhalers; can tell a difference with the heat; has air conditioning at home; works inside, but goes outside constantly; okay with that  He is not ready to quit smoking  Depression screen [facility] 2/9 08/14/2017 02/13/2017 10/05/2016 05/16/2016 11/14/2015  Decreased Interest 0 0 0 0 0  Down, Depressed, Hopeless 0 0 0 0 0  PHQ - 2 Score 0 0 0 0 0    Relevant past medical, surgical, family and social history reviewed Past Medical History:  Diagnosis Date  . COPD (chronic obstructive pulmonary disease) ([place])   . Diabetes mellitus without complication ([place])   . Hyperlipidemia   . Hypertension   . OSA (obstructive sleep apnea)    Past Surgical History:  Procedure Laterality Date  . HAND SURGERY Left   . KNEE SURGERY Left    Family History  Problem Relation Age of Onset  . Clotting disorder Mother   . Diabetes Mother   . Hypertension Mother   . Heart attack Father   . Hypertension Sister   . Hypertension Brother   . Vision loss Brother    Social History   Tobacco Use  . Smoking status: Current Every Day Smoker    Packs/day: 1.00    Years: 41.00    Pack years: 41.00  . Smokeless tobacco: Never Used  Substance Use Topics  . Alcohol use: No    Alcohol/week: 0.0 oz  . Drug use: No    Interim medical history since last visit  reviewed. Allergies and medications reviewed  Review of Systems Per HPI unless specifically indicated above     Objective:    BP 122/74   Pulse 70   Temp 98.3 F (36.8 C) (Oral)   Resp 14   Ht 5\' 8"  (1.727 m)   Wt 193 lb (87.5 kg)   SpO2 93%   BMI 29.35 kg/m   Wt Readings from Last 3 Encounters:  10/11/17 193 lb (87.5 kg)  09/27/17 195 lb 12.8 oz (88.8 kg)  08/22/17 192 lb (87.1 kg)    Physical Exam  Constitutional: He appears well-developed and well-nourished. No distress.  Eyes: No scleral icterus.  Cardiovascular: Normal rate and regular rhythm.  Pulmonary/Chest: Effort normal and breath sounds normal.  Neurological: He is alert.  Skin: Rash: resolving nummular rash on the sun-exposed arms and neck; only one is still slightly papular, remainer are macular with just slight hypopigmentation. No pallor.     Psychiatric: He has a normal mood and affect.   Diabetic Foot Form - Detailed   Diabetic Foot Exam - detailed Diabetic Foot exam was performed with the following findings:  Yes 10/11/2017  3:10 PM  Visual Foot Exam completed.:  Yes  Pulse Foot Exam completed.:  Yes  Right Dorsalis Pedis:  Present Left Dorsalis Pedis:  Present  Sensory Foot Exam Completed.:  Yes Semmes-Weinstein Monofilament Test R Site 1-Great Toe:  Pos L Site 1-Great Toe:  Pos        Results for orders placed or performed in visit on 08/14/17  Hemoglobin A1C  Result Value Ref Range   Hgb A1c MFr Bld 10.1 (H) <5.7 % of total Hgb   Mean Plasma Glucose 243 (calc)   eAG (mmol/L) 13.5 (calc)  Urine Microalbumin w/creat. ratio  Result Value Ref Range   Creatinine, Urine 81 20 - 320 mg/dL   Microalb, Ur 0.8 mg/dL   Microalb Creat Ratio 10 <30 mcg/mg creat  COMPLETE METABOLIC PANEL WITH GFR  Result Value Ref Range   Glucose, Bld 214 (H) 65 - 99 mg/dL   BUN 13 7 - 25 mg/dL   Creat 1.73 (H) 0.70 - 1.33 mg/dL   GFR, Est Non African American 42 (L) > OR = 60 mL/min/1.16m2   GFR, Est African  American 49 (L) > OR = 60 mL/min/1.25m2   BUN/Creatinine Ratio 8 6 - 22 (calc)   Sodium 137 135 - 146 mmol/L   Potassium 3.9 3.5 - 5.3 mmol/L   Chloride 98 98 - 110 mmol/L   CO2 32 20 - 32 mmol/L   Calcium 9.3 8.6 - 10.3 mg/dL   Total Protein 7.3 6.1 - 8.1 g/dL   Albumin 4.6 3.6 - 5.1 g/dL   Globulin 2.7 1.9 - 3.7 g/dL (calc)   AG Ratio 1.7 1.0 - 2.5 (calc)   Total Bilirubin 0.6 0.2 - 1.2 mg/dL   Alkaline phosphatase (APISO) 106 40 - 115 U/L   AST 18 10 - 35 U/L   ALT 15 9 - 46 U/L  Lipid panel  Result Value Ref Range   Cholesterol 91 <200 mg/dL   HDL 31 (L) >40 mg/dL   Triglycerides 246 (H) <150 mg/dL   LDL Cholesterol (Calc) 29 mg/dL (calc)   Total CHOL/HDL Ratio 2.9 <5.0 (calc)   Non-HDL Cholesterol (Calc) 60 <130 mg/dL (calc)  CBC with Differential/Platelet  Result Value Ref Range   WBC 5.7 3.8 - 10.8 Thousand/uL   RBC 4.64 4.20 - 5.80 Million/uL   Hemoglobin 13.8 13.2 - 17.1 g/dL   HCT 40.4 38.5 - 50.0 %   MCV 87.1 80.0 - 100.0 fL   MCH 29.7 27.0 - 33.0 pg   MCHC 34.2 32.0 - 36.0 g/dL   RDW 12.3 11.0 - 15.0 %   Platelets 181 140 - 400 Thousand/uL   MPV 11.8 7.5 - 12.5 fL   Neutro Abs 3,557 1,500 - 7,800 cells/uL   Lymphs Abs 1,425 850 - 3,900 cells/uL   WBC mixed population 519 200 - 950 cells/uL   Eosinophils Absolute 160 15 - 500 cells/uL   Basophils Absolute 40 0 - 200 cells/uL   Neutrophils Relative % 62.4 %   Total Lymphocyte 25.0 %   Monocytes Relative 9.1 %   Eosinophils Relative 2.8 %   Basophils Relative 0.7 %  PSA  Result Value Ref Range   PSA 0.8 < OR = 4.0 ng/mL      Assessment & Plan:   Problem List Items Addressed This Visit      Respiratory   COPD (chronic obstructive pulmonary disease) (HCC) - Primary (Chronic)    Refills provided; he has air conditioning  Relevant Medications   albuterol (PROAIR HFA) 108 (90 Base) MCG/ACT inhaler   Glycopyrrolate-Formoterol (BEVESPI AEROSPHERE) 9-4.8 MCG/ACT AERO     Endocrine   Diabetes  mellitus (HCC) (Chronic)    Foot exam by MD today; stay off of sulfonylurea; continue actos; recheck a1c in August       Other Visit Diagnoses    Photosensitivity dermatitis       reaction to the sulfonylurea; improving; stay off of that; continue to wear 30SPF sunscreen       Follow up plan: No follow-ups on file.  An after-visit summary was printed and given to the patient at [place].  Please see the patient instructions which may contain other information and recommendations beyond what is mentioned above in the assessment and plan.  Meds ordered this encounter  Medications  . albuterol (PROAIR HFA) 108 (90 Base) MCG/ACT inhaler    Sig: INHALE 2 PUFFS BY MOUTH FOUR TIMES DAILY AS NEEDED    Dispense:  8.5 g    Refill:  2  . Glycopyrrolate-Formoterol (BEVESPI AEROSPHERE) 9-4.8 MCG/ACT AERO    Sig: Inhale 2 puffs into the lungs 2 (two) times daily.    Dispense:  1 Inhaler    Refill:  11    No orders of the defined types were placed in this encounter.

## 2017-10-11 NOTE — Patient Instructions (Signed)
Continue current medicines Stay cool I do encourage you to quit smoking Call [phone] to sign up for smoking cessation classes You can call 1-800-QUIT-NOW to talk with a smoking cessation coach

## 2017-10-11 NOTE — Assessment & Plan Note (Signed)
Refills provided; he has air conditioning

## 2017-10-11 NOTE — Assessment & Plan Note (Signed)
Foot exam by MD today; stay off of sulfonylurea; continue actos; recheck a1c in August

## 2017-11-08 ENCOUNTER — Other Ambulatory Visit: Payer: Self-pay | Admitting: Family Medicine

## 2017-11-08 NOTE — Telephone Encounter (Signed)
Patient should not need a refill of the losartan-hctz 6 month supply prescribed on 08/14/17 Please confirm refill available with pharmacy

## 2017-11-20 ENCOUNTER — Other Ambulatory Visit: Payer: Self-pay | Admitting: Family Medicine

## 2017-11-20 DIAGNOSIS — E1165 Type 2 diabetes mellitus with hyperglycemia: Secondary | ICD-10-CM

## 2017-11-20 NOTE — Telephone Encounter (Signed)
Last appt:7/19 Next: oct

## 2017-12-02 ENCOUNTER — Ambulatory Visit (INDEPENDENT_AMBULATORY_CARE_PROVIDER_SITE_OTHER): Payer: BC Managed Care – PPO | Admitting: Family Medicine

## 2017-12-02 ENCOUNTER — Encounter: Payer: Self-pay | Admitting: Family Medicine

## 2017-12-02 VITALS — BP 102/74 | HR 63 | Temp 97.6°F | Ht 68.0 in | Wt 190.0 lb

## 2017-12-02 DIAGNOSIS — K625 Hemorrhage of anus and rectum: Secondary | ICD-10-CM | POA: Diagnosis not present

## 2017-12-02 DIAGNOSIS — E1165 Type 2 diabetes mellitus with hyperglycemia: Secondary | ICD-10-CM

## 2017-12-02 DIAGNOSIS — Z0000 Encounter for general adult medical examination without abnormal findings: Secondary | ICD-10-CM

## 2017-12-02 DIAGNOSIS — Z23 Encounter for immunization: Secondary | ICD-10-CM | POA: Diagnosis not present

## 2017-12-02 DIAGNOSIS — R252 Cramp and spasm: Secondary | ICD-10-CM | POA: Diagnosis not present

## 2017-12-02 LAB — COMPLETE METABOLIC PANEL WITH GFR
AG RATIO: 1.7 (calc) (ref 1.0–2.5)
ALKALINE PHOSPHATASE (APISO): 101 U/L (ref 40–115)
ALT: 9 U/L (ref 9–46)
AST: 14 U/L (ref 10–35)
Albumin: 4.3 g/dL (ref 3.6–5.1)
BUN/Creatinine Ratio: 9 (calc) (ref 6–22)
BUN: 20 mg/dL (ref 7–25)
CHLORIDE: 99 mmol/L (ref 98–110)
CO2: 31 mmol/L (ref 20–32)
Calcium: 9.1 mg/dL (ref 8.6–10.3)
Creat: 2.34 mg/dL — ABNORMAL HIGH (ref 0.70–1.25)
GFR, Est African American: 34 mL/min/{1.73_m2} — ABNORMAL LOW (ref >=60)
GFR, Est Non African American: 29 mL/min/{1.73_m2} — ABNORMAL LOW (ref >=60)
GLUCOSE: 299 mg/dL — AB (ref 65–99)
Globulin: 2.5 g/dL (calc) (ref 1.9–3.7)
Potassium: 3.7 mmol/L (ref 3.5–5.3)
Sodium: 137 mmol/L (ref 135–146)
TOTAL PROTEIN: 6.8 g/dL (ref 6.1–8.1)
Total Bilirubin: 0.4 mg/dL (ref 0.2–1.2)

## 2017-12-02 LAB — CBC WITH DIFFERENTIAL/PLATELET
BASOS ABS: 32 {cells}/uL (ref 0–200)
Basophils Relative: 0.6 %
Eosinophils Absolute: 200 cells/uL (ref 15–500)
Eosinophils Relative: 3.7 %
HCT: 41.1 % (ref 38.5–50.0)
Hemoglobin: 13.7 g/dL (ref 13.2–17.1)
Lymphs Abs: 1134 cells/uL (ref 850–3900)
MCH: 29.6 pg (ref 27.0–33.0)
MCHC: 33.3 g/dL (ref 32.0–36.0)
MCV: 88.8 fL (ref 80.0–100.0)
MONOS PCT: 11.8 %
MPV: 11.9 fL (ref 7.5–12.5)
NEUTROS ABS: 3397 {cells}/uL (ref 1500–7800)
NEUTROS PCT: 62.9 %
PLATELETS: 178 10*3/uL (ref 140–400)
RBC: 4.63 10*6/uL (ref 4.20–5.80)
RDW: 12 % (ref 11.0–15.0)
Total Lymphocyte: 21 %
WBC mixed population: 637 cells/uL (ref 200–950)
WBC: 5.4 10*3/uL (ref 3.8–10.8)

## 2017-12-02 LAB — LIPID PANEL
Cholesterol: 79 mg/dL (ref <200)
HDL: 29 mg/dL — AB (ref >40)
LDL Cholesterol (Calc): 28 mg/dL (calc)
Non-HDL Cholesterol (Calc): 50 mg/dL (calc) (ref <130)
Total CHOL/HDL Ratio: 2.7 (calc) (ref <5.0)
Triglycerides: 141 mg/dL (ref <150)

## 2017-12-02 LAB — MAGNESIUM: Magnesium: 1.5 mg/dL (ref 1.5–2.5)

## 2017-12-02 LAB — HEMOGLOBIN A1C
EAG (MMOL/L): 14.3 (calc)
Hgb A1c MFr Bld: 10.6 % of total Hgb — ABNORMAL HIGH (ref <5.7)
MEAN PLASMA GLUCOSE: 258 (calc)

## 2017-12-02 NOTE — Patient Instructions (Addendum)
I do encourage you to quit smoking Call [phone] to sign up for smoking cessation classes You can call 1-800-QUIT-NOW to talk with a smoking cessation coach Let's check labs today We'll have you see the gastroenterologist Increase fiber in the diet and try to drink 64 ounces of water a day If you have not heard anything from my staff in a week about any orders/referrals/studies from today, please contact us here to follow-up [phone]   Health Maintenance, Male A healthy lifestyle and preventive care is important for your health and wellness. Ask your health care provider about what schedule of regular examinations is right for you. What should I know about weight and diet? Eat a Healthy Diet  Eat plenty of vegetables, fruits, whole grains, low-fat dairy products, and lean protein.  Do not eat a lot of foods high in solid fats, added sugars, or salt.  Maintain a Healthy Weight Regular exercise can help you achieve or maintain a healthy weight. You should:  Do at least 150 minutes of exercise each week. The exercise should increase your heart rate and make you sweat (moderate-intensity exercise).  Do strength-training exercises at least twice a week.  Watch Your Levels of Cholesterol and Blood Lipids  Have your blood tested for lipids and cholesterol every 5 years starting at 60 years of age. If you are at high risk for heart disease, you should start having your blood tested when you are 60 years old. You may need to have your cholesterol levels checked more often if: ? Your lipid or cholesterol levels are high. ? You are older than 60 years of age. ? You are at high risk for heart disease.  What should I know about cancer screening? Many types of cancers can be detected early and may often be prevented. Lung Cancer  You should be screened every year for lung cancer if: ? You are a current smoker who has smoked for at least 30 years. ? You are a former smoker who has  quit within the past 15 years.  Talk to your health care provider about your screening options, when you should start screening, and how often you should be screened.  Colorectal Cancer  Routine colorectal cancer screening usually begins at 60 years of age and should be repeated every 5-10 years until you are 60 years old. You may need to be screened more often if early forms of precancerous polyps or small growths are found. Your health care provider may recommend screening at an earlier age if you have risk factors for colon cancer.  Your health care provider may recommend using home test kits to check for hidden blood in the stool.  A small camera at the end of a tube can be used to examine your colon (sigmoidoscopy or colonoscopy). This checks for the earliest forms of colorectal cancer.  Prostate and Testicular Cancer  Depending on your age and overall health, your health care provider may do certain tests to screen for prostate and testicular cancer.  Talk to your health care provider about any symptoms or concerns you have about testicular or prostate cancer.  Skin Cancer  Check your skin from head to toe regularly.  Tell your health care provider about any new moles or changes in moles, especially if: ? There is a change in a mole's size, shape, or color. ? You have a mole that is larger than a pencil eraser.  Always use sunscreen. Apply sunscreen liberally and repeat throughout the day.  Protect yourself by wearing long sleeves, pants, a wide-brimmed hat, and sunglasses when outside.  What should I know about heart disease, diabetes, and high blood pressure?  If you are 53-44 years of age, have your blood pressure checked every 3-5 years. If you are 70 years of age or older, have your blood pressure checked every year. You should have your blood pressure measured twice-once when you are at a hospital or clinic, and once when you are not at a hospital or clinic. Record the  average of the two measurements. To check your blood pressure when you are not at a hospital or clinic, you can use: ? An automated blood pressure machine at a pharmacy. ? A home blood pressure monitor.  Talk to your health care provider about your target blood pressure.  If you are between 96-96 years old, ask your health care provider if you should take aspirin to prevent heart disease.  Have regular diabetes screenings by checking your fasting blood sugar level. ? If you are at a normal weight and have a low risk for diabetes, have this test once every three years after the age of 87. ? If you are overweight and have a high risk for diabetes, consider being tested at a younger age or more often.  A one-time screening for abdominal aortic aneurysm (AAA) by ultrasound is recommended for men aged 70-75 years who are current or former smokers. What should I know about preventing infection? Hepatitis B If you have a higher risk for hepatitis B, you should be screened for this virus. Talk with your health care provider to find out if you are at risk for hepatitis B infection. Hepatitis C Blood testing is recommended for:  Everyone born from [birth year] through 1963.  Anyone with known risk factors for hepatitis C.  Sexually Transmitted Diseases (STDs)  You should be screened each year for STDs including gonorrhea and chlamydia if: ? You are sexually active and are younger than 60 years of age. ? You are older than 60 years of age and your health care provider tells you that you are at risk for this type of infection. ? Your sexual activity has changed since you were last screened and you are at an increased risk for chlamydia or gonorrhea. Ask your health care provider if you are at risk.  Talk with your health care provider about whether you are at high risk of being infected with HIV. Your health care provider may recommend a prescription medicine to help prevent HIV infection.  What else can  I do?  Schedule regular health, dental, and eye exams.  Stay current with your vaccines (immunizations).  Do not use any tobacco products, such as cigarettes, chewing tobacco, and e-cigarettes. If you need help quitting, ask your health care provider.  Limit alcohol intake to no more than 2 drinks per day. One drink equals 12 ounces of beer, 5 ounces of wine, or 1 ounces of hard liquor.  Do not use street drugs.  Do not share needles.  Ask your health care provider for help if you need support or information about quitting drugs.  Tell your health care provider if you often feel depressed.  Tell your health care provider if you have ever been abused or do not feel safe at home. This information is not intended to replace advice given to you by your health care provider. Make sure you discuss any questions you have with your health care provider. Document Released: 09/08/2007 Document  Revised: 11/09/2015 Document Reviewed: 12/14/2014 Elsevier Interactive Patient Education  [employer].

## 2017-12-02 NOTE — Progress Notes (Signed)
Patient ID: Zachary Wallace, male   DOB: [birth year], 62 y.o.   MRN: [id number]   Subjective:   Zachary Wallace is a 62 y.o. male here for a complete physical exam  Interim issues since last visit: no medical excitement  Some longevity in the family Immunizations: flu today, offered shingrix  USPSTF grade A and B recommendations Depression:  Depression screen [facility] 2/9 12/02/2017 08/14/2017 02/13/2017 10/05/2016 05/16/2016  Decreased Interest 0 0 0 0 0  Down, Depressed, Hopeless 0 0 0 0 0  PHQ - 2 Score 0 0 0 0 0   Hypertension: BP Readings from Last 3 Encounters:  12/02/17 102/74  10/11/17 122/74  09/27/17 132/78   Obesity: Wt Readings from Last 3 Encounters:  12/02/17 190 lb (86.2 kg)  10/11/17 193 lb (87.5 kg)  09/27/17 195 lb 12.8 oz (88.8 kg)   BMI Readings from Last 3 Encounters:  12/02/17 28.89 kg/m  10/11/17 29.35 kg/m  09/27/17 29.77 kg/m    Immunizations:  Skin cancer: no worrisome moles; just the hypopigemtnation from med reaction Lung cancer:  Smoking about 1 ppd; not close to quitting; chest CT UTD, due in Dec 2019 Prostate cancer: no hx; PSA UTD Lab Results  Component Value Date   PSA 0.8 08/14/2017   PSA 0.9 05/16/2016   Colorectal cancer: 2012; no fam hx; no blood in the stool, but just with wiping; might be hemorrhoids; no exam recently; bright red blood; very seldom, every month; going on for six months AAA: no fam hx Aspirin: taking Diet: pretty good eater Exercise: does a lot of walking Alcohol: no   [facility] 12/[facility]/2017 [facility]  AUDIT-C Score  0     Tobacco use: already disease HIV, hep B, hep C: declined STD testing and prevention (chl/gon/syphilis): declined Glucose:  Glucose, Bld  Date Value Ref Range Status  08/14/2017 214 (H) 65 - 99 mg/dL Final    Comment:    .            Fasting reference interval . For someone without known diabetes, a glucose value >125 mg/dL indicates that they may have diabetes  and this should be confirmed with a follow-up test. .   02/13/2017 156 (H) 65 - 99 mg/dL Final    Comment:    .            Fasting reference interval . For someone without known diabetes, a glucose value >125 mg/dL indicates that they may have diabetes and this should be confirmed with a follow-up test. .   10/05/2016 64 (L) 65 - 99 mg/dL Final   Lipids:  Lab Results  Component Value Date   CHOL 91 08/14/2017   CHOL 96 02/13/2017   CHOL 90 10/05/2016   Lab Results  Component Value Date   HDL 31 (L) 08/14/2017   HDL 31 (L) 02/13/2017   HDL 32 (L) 10/05/2016   Lab Results  Component Value Date   LDLCALC 29 08/14/2017   LDLCALC 36 02/13/2017   LDLCALC 31 10/05/2016   Lab Results  Component Value Date   TRIG 246 (H) 08/14/2017   TRIG 249 (H) 02/13/2017   TRIG 133 10/05/2016   Lab Results  Component Value Date   CHOLHDL 2.9 08/14/2017   CHOLHDL 3.1 02/13/2017   CHOLHDL 2.8 10/05/2016   No results found for: LDLDIRECT   Past Medical History:  Diagnosis Date  . COPD (chronic obstructive pulmonary disease) ([place])   . Diabetes mellitus  without complication ([place])   . Hyperlipidemia   . Hypertension   . OSA (obstructive sleep apnea)    Past Surgical History:  Procedure Laterality Date  . HAND SURGERY Left   . KNEE SURGERY Left    Family History  Problem Relation Age of Onset  . Clotting disorder Mother   . Diabetes Mother   . Hypertension Mother   . Heart attack Father   . Hypertension Sister   . Hypertension Brother   . Vision loss Brother    Social History   Tobacco Use  . Smoking status: Current Every Day Smoker    Packs/day: 1.00    Years: 41.00    Pack years: 41.00  . Smokeless tobacco: Never Used  Substance Use Topics  . Alcohol use: No    Alcohol/week: 0.0 standard drinks  . Drug use: No   Review of Systems  Constitutional: Negative for unexpected weight change.  HENT: Negative for hearing loss.   Eyes: Negative for visual  disturbance.  Cardiovascular: Negative for chest pain, palpitations and leg swelling.  Gastrointestinal: Positive for blood in stool. Negative for rectal pain.  Endocrine: Negative for polydipsia.  Genitourinary: Negative for hematuria.  Musculoskeletal:       Cramps in the muscles and joints; eating a banana helps some  Allergic/Immunologic: Negative for food allergies.  Neurological: Negative for tremors.    Objective:   Vitals:   12/02/17 0852  BP: 102/74  Pulse: 63  Temp: 97.6 F (36.4 C)  SpO2: 94%  Weight: 190 lb (86.2 kg)  Height: 5\' 8"  (1.727 m)   Body mass index is 28.89 kg/m. Wt Readings from Last 3 Encounters:  12/02/17 190 lb (86.2 kg)  10/11/17 193 lb (87.5 kg)  09/27/17 195 lb 12.8 oz (88.8 kg)   Physical Exam  Constitutional: He appears well-developed and well-nourished. No distress.  HENT:  Head: Normocephalic and atraumatic.  Nose: Nose normal.  Mouth/Throat: Oropharynx is clear and moist.  Eyes: EOM are normal. No scleral icterus.  Neck: No JVD present. No thyromegaly present.  Cardiovascular: Normal rate, regular rhythm and normal heart sounds.  Pulmonary/Chest: Effort normal and breath sounds normal. No respiratory distress. He has no wheezes. He has no rales.  Abdominal: Soft. Bowel sounds are normal. He exhibits no distension. There is no tenderness. There is no guarding.  Musculoskeletal: Normal range of motion. He exhibits no edema.  Lymphadenopathy:    He has no cervical adenopathy.  Neurological: He is alert. He displays normal reflexes. He exhibits normal muscle tone. Coordination normal.  Skin: Skin is warm and dry. No rash noted. He is not diaphoretic. No erythema. No pallor.  Psychiatric: He has a normal mood and affect. His behavior is normal. Judgment and thought content normal.   Diabetic Foot Form - Detailed   Diabetic Foot Exam - detailed Diabetic Foot exam was performed with the following findings:  Yes 12/02/2017  9:38 AM  Visual  Foot Exam completed.:  Yes  Pulse Foot Exam completed.:  Yes  Right Dorsalis Pedis:  Present Left Dorsalis Pedis:  Present  Sensory Foot Exam Completed.:  Yes Semmes-Weinstein Monofilament Test R Site 1-Great Toe:  Pos L Site 1-Great Toe:  Pos        Assessment/Plan:   Problem List Items Addressed This Visit      Endocrine   Diabetes mellitus ([place]) (Chronic)   Relevant Orders   Hemoglobin A1c     Other   Preventative health care - Primary  USPSTF grade A and B recommendations reviewed with patient; age-appropriate recommendations, preventive care, screening tests, etc discussed and encouraged; healthy living encouraged; see AVS for patient education given to patient      Relevant Orders   Lipid panel   COMPLETE METABOLIC PANEL WITH GFR   CBC with Differential/Platelet    Other Visit Diagnoses    Need for influenza vaccination       Relevant Orders   Flu Vaccine QUAD 6+ mos PF IM (Fluarix Quad PF) (Completed)   BRBPR (bright red blood per rectum)       Relevant Orders   Ambulatory referral to Gastroenterology   Cramps, extremity       Relevant Orders   Magnesium   Need for shingles vaccine       Relevant Orders   Varicella-zoster vaccine IM (Shingrix) (Completed)      No orders of the defined types were placed in this encounter.  Orders Placed This Encounter  Procedures  . Flu Vaccine QUAD 6+ mos PF IM (Fluarix Quad PF)  . Varicella-zoster vaccine IM (Shingrix)  . Magnesium  . Lipid panel  . Hemoglobin A1c  . COMPLETE METABOLIC PANEL WITH GFR  . CBC with Differential/Platelet  . Ambulatory referral to Gastroenterology    Referral Priority:   Routine    Referral Type:   Consultation    Referral Reason:   Specialty Services Required    Referred to Provider:   Lin Landsman, MD    Number of Visits Requested:   1    Follow up plan: Return in about 1 year (around 12/03/2018) for complete physical.  An After Visit Summary was printed and given to the  patient.

## 2017-12-02 NOTE — Assessment & Plan Note (Signed)
USPSTF grade A and B recommendations reviewed with patient; age-appropriate recommendations, preventive care, screening tests, etc discussed and encouraged; healthy living encouraged; see AVS for patient education given to patient  

## 2017-12-05 ENCOUNTER — Encounter: Payer: Self-pay | Admitting: *Deleted

## 2017-12-06 ENCOUNTER — Telehealth: Payer: Self-pay

## 2017-12-06 DIAGNOSIS — E1165 Type 2 diabetes mellitus with hyperglycemia: Secondary | ICD-10-CM

## 2017-12-06 NOTE — Telephone Encounter (Signed)
-----   Message from Arnetha Courser, MD sent at 12/05/2017  6:16 PM EDT ----- Please let the patient know that his magnesium is at the lower end of normal; if his kidney doctor agrees, he can increase his intake of foods with some magnesium in them (but he needs to check with his kidney doctor first) His HDL is too low; that is associated with smoking; low HDL levels are associated with heart attacks; we strongly urge him to quit smoking His 3 month blood sugar average shows that his diabetes is out of control; please REFER to endocrinologist to be seen ASAP; he may need to start insulin therapy His kidney function has declined; please FAX labs to his kidney doctor; avoid NSAIDs, quit smoking, avoid red meat, and work with diabetes specialist to get diabetes under control

## 2017-12-31 ENCOUNTER — Ambulatory Visit: Payer: BC Managed Care – PPO | Admitting: Gastroenterology

## 2017-12-31 ENCOUNTER — Encounter: Payer: Self-pay | Admitting: Gastroenterology

## 2017-12-31 ENCOUNTER — Other Ambulatory Visit: Payer: Self-pay

## 2017-12-31 VITALS — BP 130/85 | HR 60 | Resp 16 | Ht 68.0 in | Wt 189.4 lb

## 2017-12-31 DIAGNOSIS — D126 Benign neoplasm of colon, unspecified: Secondary | ICD-10-CM | POA: Diagnosis not present

## 2017-12-31 DIAGNOSIS — K625 Hemorrhage of anus and rectum: Secondary | ICD-10-CM

## 2017-12-31 DIAGNOSIS — K640 First degree hemorrhoids: Secondary | ICD-10-CM

## 2017-12-31 MED ORDER — HYDROCORTISONE 2.5 % RE CREA: 1.0000 "application " | TOPICAL_CREAM | Freq: Two times a day (BID) | RECTAL | 0 refills | Status: AC

## 2017-12-31 NOTE — Patient Instructions (Signed)
High-Fiber Diet  Fiber, also called dietary fiber, is a type of carbohydrate found in fruits, vegetables, whole grains, and beans. A high-fiber diet can have many health benefits. Your health care provider may recommend a high-fiber diet to help:  · Prevent constipation. Fiber can make your bowel movements more regular.  · Lower your cholesterol.  · Relieve hemorrhoids, uncomplicated diverticulosis, or irritable bowel syndrome.  · Prevent overeating as part of a weight-loss plan.  · Prevent heart disease, type 2 diabetes, and certain cancers.    What is my plan?  The recommended daily intake of fiber includes:  · 38 grams for men under age 50.  · 30 grams for men over age 50.  · 25 grams for women under age 50.  · 21 grams for women over age 50.    You can get the recommended daily intake of dietary fiber by eating a variety of fruits, vegetables, grains, and beans. Your health care provider may also recommend a fiber supplement if it is not possible to get enough fiber through your diet.  What do I need to know about a high-fiber diet?  · Fiber supplements have not been widely studied for their effectiveness, so it is better to get fiber through food sources.  · Always check the fiber content on the nutrition facts label of any prepackaged food. Look for foods that contain at least 5 grams of fiber per serving.  · Ask your dietitian if you have questions about specific foods that are related to your condition, especially if those foods are not listed in the following section.  · Increase your daily fiber consumption gradually. Increasing your intake of dietary fiber too quickly may cause bloating, cramping, or gas.  · Drink plenty of water. Water helps you to digest fiber.  What foods can I eat?  Grains  Whole-grain breads. Multigrain cereal. Oats and oatmeal. Brown rice. Barley. Bulgur wheat. Millet. Bran muffins. Popcorn. Rye wafer crackers.  Vegetables   Sweet potatoes. Spinach. Kale. Artichokes. Cabbage. Broccoli. Green peas. Carrots. Squash.  Fruits  Berries. Pears. Apples. Oranges. Avocados. Prunes and raisins. Dried figs.  Meats and Other Protein Sources  Navy, kidney, pinto, and soy beans. Split peas. Lentils. Nuts and seeds.  Dairy  Fiber-fortified yogurt.  Beverages  Fiber-fortified soy milk. Fiber-fortified orange juice.  Other  Fiber bars.  The items listed above may not be a complete list of recommended foods or beverages. Contact your dietitian for more options.  What foods are not recommended?  Grains  White bread. Pasta made with refined flour. White rice.  Vegetables  Fried potatoes. Canned vegetables. Well-cooked vegetables.  Fruits  Fruit juice. Cooked, strained fruit.  Meats and Other Protein Sources  Fatty cuts of meat. Fried poultry or fried fish.  Dairy  Milk. Yogurt. Cream cheese. Sour cream.  Beverages  Soft drinks.  Other  Cakes and pastries. Butter and oils.  The items listed above may not be a complete list of foods and beverages to avoid. Contact your dietitian for more information.  What are some tips for including high-fiber foods in my diet?  · Eat a wide variety of high-fiber foods.  · Make sure that half of all grains consumed each day are whole grains.  · Replace breads and cereals made from refined flour or white flour with whole-grain breads and cereals.  · Replace white rice with brown rice, bulgur wheat, or millet.  · Start the day with a breakfast that is high in fiber,   such as a cereal that contains at least 5 grams of fiber per serving.  · Use beans in place of meat in soups, salads, or pasta.  · Eat high-fiber snacks, such as berries, raw vegetables, nuts, or popcorn.  This information is not intended to replace advice given to you by your health care provider. Make sure you discuss any questions you have with your health care provider.  Document Released: 03/12/2005 Document Revised: 08/18/2015 Document Reviewed: 08/25/2013   Elsevier Interactive Patient Education © 2018 Elsevier Inc.

## 2017-12-31 NOTE — Progress Notes (Signed)
130 8 

## 2017-12-31 NOTE — Progress Notes (Signed)
Cephas Darby, MD [address]  [place]  [place], [place] [address]  Main: [phone]  Fax: [phone]    Gastroenterology Consultation  Referring Provider:     Arnetha Courser, MD Primary Care Physician:  Arnetha Courser, MD Primary Gastroenterologist:  Dr. Cephas Darby Reason for Consultation:     Rectal bleeding        HPI:   Zachary Wallace is a 62 y.o. male referred by Dr. Arnetha Courser, MD  for consultation & management of rectal bleeding.  Patient reports that he has been experiencing bright red blood per rectum, particularly on wiping for the last 3 to 4 months.  He has been suffering from constipation as well.  He reports that his stools are hard balls and had to spend several minutes sitting on toilet pushing and straining.  He drinks sodas every day and consumes red meat few times a week.  He denies any other GI symptoms.  He has not tried any over-the-counter hemorrhoidal creams.  He has tried MiraLAX as needed which did not help.  NSAIDs: None  Antiplts/Anticoagulants/Anti thrombotics: None He smokes cigarettes  He denies family history of GI malignancy He denies any GI surgeries  GI Procedures:  Colonoscopy in 12/2013, report not available Diagnosis:  Part A: TRANSVERSE COLON POLYP X2 COLD BIOPSY:  - TUBULAR ADENOMA (2).  - NEGATIVE FOR HIGH GRADE DYSPLASIA AND MALIGNANCY.  .  Part B: SIGMOID POLYP COLD BIOPSY:  - HYPERPLASTIC POLYP.  - NEGATIVE FOR DYSPLASIA AND MALIGNANCY.   Past Medical History:  Diagnosis Date  . COPD (chronic obstructive pulmonary disease) ([place])   . Diabetes mellitus without complication ([place])   . Hyperlipidemia   . Hypertension   . OSA (obstructive sleep apnea)     Past Surgical History:  Procedure Laterality Date  . HAND SURGERY Left   . KNEE SURGERY Left     Current Outpatient Medications:  .  albuterol (PROAIR HFA) 108 (90 Base) MCG/ACT inhaler, INHALE 2 PUFFS BY MOUTH FOUR TIMES DAILY AS NEEDED, Disp: 8.5  g, Rfl: 2 .  amLODipine (NORVASC) 10 MG tablet, TAKE (1) TABLET BY MOUTH DAILY FOR HIGH BLOOD PRESSURE., Disp: 90 tablet, Rfl: 0 .  aspirin 81 MG tablet, Take 81 mg by mouth., Disp: , Rfl:  .  atorvastatin (LIPITOR) 20 MG tablet, Take 1 tablet (20 mg total) by mouth at bedtime., Disp: 90 tablet, Rfl: 3 .  glucose blood (ONETOUCH VERIO) test strip, , Disp: , Rfl:  .  Glycopyrrolate-Formoterol (BEVESPI AEROSPHERE) 9-4.8 MCG/ACT AERO, Inhale 2 puffs into the lungs 2 (two) times daily., Disp: 1 Inhaler, Rfl: 11 .  linagliptin (TRADJENTA) 5 MG TABS tablet, Take 1 tablet (5 mg total) by mouth daily., Disp: 90 tablet, Rfl: 3 .  losartan-hydrochlorothiazide (HYZAAR) 100-12.5 MG tablet, TAKE 1 TABLET BY MOUTH ONCE DAILY., Disp: 90 tablet, Rfl: 0 .  metoprolol tartrate (LOPRESSOR) 100 MG tablet, Take 1 tablet (100 mg total) by mouth 2 (two) times daily., Disp: 180 tablet, Rfl: 3 .  pioglitazone (ACTOS) 15 MG tablet, TAKE 1 TABLET BY MOUTH ONCE DAILY., Disp: 30 tablet, Rfl: 0 .  sildenafil (VIAGRA) 100 MG tablet, Take 1 tablet (100 mg total) by mouth daily., Disp: 3 tablet, Rfl: 20 .  hydrocortisone (ANUSOL-HC) 2.5 % rectal cream, Place 1 application rectally 2 (two) times daily for 14 days., Disp: 42 g, Rfl: 0    Family History  Problem Relation Age of Onset  . Clotting  disorder Mother   . Diabetes Mother   . Hypertension Mother   . Heart attack Father   . Hypertension Sister   . Hypertension Brother   . Vision loss Brother      Social History   Tobacco Use  . Smoking status: Current Every Day Smoker    Packs/day: 1.00    Years: 41.00    Pack years: 41.00  . Smokeless tobacco: Never Used  Substance Use Topics  . Alcohol use: No    Alcohol/week: 0.0 standard drinks  . Drug use: No    Allergies as of 12/31/2017  . (No Known Allergies)    Review of Systems:    All systems reviewed and negative except where noted in HPI.   Physical Exam:  BP 130/85 (BP Location: Left Arm, Patient  Position: Sitting, Cuff Size: Large)   Pulse 60   Resp 16   Ht 5\' 8"  (1.727 m)   Wt 189 lb 6.4 oz (85.9 kg)   BMI 28.80 kg/m  No LMP for male patient.  General:   Alert,  Well-developed, well-nourished, pleasant and cooperative in NAD Head:  Normocephalic and atraumatic. Eyes:  Sclera clear, no icterus.   Conjunctiva pink. Ears:  Normal auditory acuity. Nose:  No deformity, discharge, or lesions. Mouth:  No deformity or lesions,oropharynx pink & moist. Neck:  Supple; no masses or thyromegaly. Lungs:  Respirations even and unlabored.  Clear throughout to auscultation.   No wheezes, crackles, or rhonchi. No acute distress. Heart:  Regular rate and rhythm; no murmurs, clicks, rubs, or gallops. Abdomen:  Normal bowel sounds. Soft, non-tender and non-distended without masses, hepatosplenomegaly or hernias noted.  No guarding or rebound tenderness.   Rectal: Not performed Msk:  Symmetrical without gross deformities. Good, equal movement & strength bilaterally. Pulses:  Normal pulses noted. Extremities:  No clubbing or edema.  No cyanosis. Neurologic:  Alert and oriented x3;  grossly normal neurologically. Skin:  Intact without significant lesions or rashes. No jaundice. Lymph Nodes:  No significant cervical adenopathy. Psych:  Alert and cooperative. Normal mood and affect.  Imaging Studies: No abdominal imaging  Assessment and Plan:   Zachary Wallace is a 62 y.o. African-American male with hypertension, hyperlipidemia, sleep apnea who presents with 3 to 31-month history of rectal bleeding and symptomatic hemorrhoids, severe constipation, history of colon adenomas.  Severe constipation: His diet is devoid of fiber Advised him about high-fiber diet, fiber supplements Start MiraLAX 1-2 capful daily Cut back on red meat and carbonated beverages  Symptomatic hemorrhoids: Secondary to severe constipation Start Anusol cream twice daily for 2 weeks  Tubular adenomas of the  colon: Recommend surveillance colonoscopy   Follow up in 4 weeks with   Cephas Darby, MD

## 2018-01-17 ENCOUNTER — Other Ambulatory Visit: Payer: Self-pay | Admitting: Family Medicine

## 2018-01-17 DIAGNOSIS — J42 Unspecified chronic bronchitis: Secondary | ICD-10-CM

## 2018-01-17 NOTE — Telephone Encounter (Signed)
I'm going to forward this to Dr. Ashby Dawes, pulmonology It looks like he has run through 3 inhalers since late July by prescribing hx; will defer further refills to pulm

## 2018-01-29 ENCOUNTER — Ambulatory Visit: Payer: BC Managed Care – PPO | Admitting: Gastroenterology

## 2018-02-04 ENCOUNTER — Encounter
Admission: RE | Disposition: A | Discharge status: Home / Self Care | Payer: Self-pay | Source: Ambulatory Visit | Attending: Gastroenterology

## 2018-02-04 ENCOUNTER — Ambulatory Visit: Payer: BC Managed Care – PPO | Admitting: Anesthesiology

## 2018-02-04 ENCOUNTER — Ambulatory Visit
Admission: RE | Admit: 2018-02-04 | Discharge: 2018-02-04 | Disposition: A | Discharge status: Home / Self Care | Payer: BC Managed Care – PPO | Source: Ambulatory Visit | Attending: Gastroenterology | Admitting: Gastroenterology

## 2018-02-04 DIAGNOSIS — E119 Type 2 diabetes mellitus without complications: Secondary | ICD-10-CM | POA: Diagnosis not present

## 2018-02-04 DIAGNOSIS — F17210 Nicotine dependence, cigarettes, uncomplicated: Secondary | ICD-10-CM | POA: Insufficient documentation

## 2018-02-04 DIAGNOSIS — E785 Hyperlipidemia, unspecified: Secondary | ICD-10-CM | POA: Diagnosis not present

## 2018-02-04 DIAGNOSIS — K5730 Diverticulosis of large intestine without perforation or abscess without bleeding: Secondary | ICD-10-CM | POA: Insufficient documentation

## 2018-02-04 DIAGNOSIS — Z7982 Long term (current) use of aspirin: Secondary | ICD-10-CM | POA: Diagnosis not present

## 2018-02-04 DIAGNOSIS — G4733 Obstructive sleep apnea (adult) (pediatric): Secondary | ICD-10-CM | POA: Insufficient documentation

## 2018-02-04 DIAGNOSIS — K625 Hemorrhage of anus and rectum: Secondary | ICD-10-CM

## 2018-02-04 DIAGNOSIS — Z86010 Personal history of colonic polyps: Secondary | ICD-10-CM | POA: Diagnosis not present

## 2018-02-04 DIAGNOSIS — G4730 Sleep apnea, unspecified: Secondary | ICD-10-CM | POA: Diagnosis not present

## 2018-02-04 DIAGNOSIS — Z09 Encounter for follow-up examination after completed treatment for conditions other than malignant neoplasm: Secondary | ICD-10-CM | POA: Diagnosis not present

## 2018-02-04 DIAGNOSIS — Z7984 Long term (current) use of oral hypoglycemic drugs: Secondary | ICD-10-CM | POA: Diagnosis not present

## 2018-02-04 DIAGNOSIS — I10 Essential (primary) hypertension: Secondary | ICD-10-CM | POA: Insufficient documentation

## 2018-02-04 DIAGNOSIS — D126 Benign neoplasm of colon, unspecified: Secondary | ICD-10-CM

## 2018-02-04 DIAGNOSIS — Z79899 Other long term (current) drug therapy: Secondary | ICD-10-CM | POA: Diagnosis not present

## 2018-02-04 DIAGNOSIS — J449 Chronic obstructive pulmonary disease, unspecified: Secondary | ICD-10-CM | POA: Diagnosis not present

## 2018-02-04 HISTORY — PX: COLONOSCOPY WITH PROPOFOL: SHX5780

## 2018-02-04 LAB — GLUCOSE, CAPILLARY: Glucose-Capillary: 122 mg/dL — ABNORMAL HIGH (ref 70–99)

## 2018-02-04 SURGERY — COLONOSCOPY WITH PROPOFOL
Anesthesia: General

## 2018-02-04 MED ORDER — PROPOFOL 10 MG/ML IV BOLUS
INTRAVENOUS | Status: DC | PRN
  Administered 2018-02-04: 100 mg via INTRAVENOUS

## 2018-02-04 MED ORDER — PROPOFOL 10 MG/ML IV BOLUS
INTRAVENOUS | Status: AC
  Filled 2018-02-04: qty 40

## 2018-02-04 MED ORDER — SODIUM CHLORIDE 0.9 % IV SOLN
INTRAVENOUS | Status: DC
  Administered 2018-02-04: 1000 mL via INTRAVENOUS

## 2018-02-04 MED ORDER — PROPOFOL 500 MG/50ML IV EMUL
INTRAVENOUS | Status: DC | PRN
  Administered 2018-02-04: 100 ug/kg/min via INTRAVENOUS

## 2018-02-04 MED ORDER — LIDOCAINE HCL (CARDIAC) PF 100 MG/5ML IV SOSY
PREFILLED_SYRINGE | INTRAVENOUS | Status: DC | PRN
  Administered 2018-02-04: 80 mg via INTRAVENOUS

## 2018-02-04 NOTE — Anesthesia Post-op Follow-up Note (Signed)
Anesthesia QCDR form completed.        

## 2018-02-04 NOTE — Anesthesia Postprocedure Evaluation (Signed)
Anesthesia Post Note  Patient: Zachary Wallace  Procedure(s) Performed: COLONOSCOPY WITH PROPOFOL (N/A )  Patient location during evaluation: Endoscopy Anesthesia Type: General Level of consciousness: awake and alert Pain management: pain level controlled Vital Signs Assessment: post-procedure vital signs reviewed and stable Respiratory status: spontaneous breathing, nonlabored ventilation, respiratory function stable and patient connected to nasal cannula oxygen Cardiovascular status: blood pressure returned to baseline and stable Postop Assessment: no apparent nausea or vomiting Anesthetic complications: no     Last Vitals:  Vitals:   02/04/18 1358 02/04/18 1408  BP: 109/83 (!) 137/92  Pulse: (!) 54 (!) 47  Resp: (!) 23 16  Temp:    SpO2: 99% 100%    Last Pain:  Vitals:   02/04/18 1358  TempSrc:   PainSc: 0-No pain                 Mylei Brackeen S

## 2018-02-04 NOTE — H&P (Signed)
Cephas Darby, MD [address]  [place]  [place], [place] [address]  Main: [phone]  Fax: [phone] Pager: [phone]  Primary Care Physician:  Arnetha Courser, MD Primary Gastroenterologist:  Dr. Cephas Darby  Pre-Procedure History & Physical: HPI:  Zachary Wallace is a 62 y.o. male is here for an colonoscopy.   Past Medical History:  Diagnosis Date  . COPD (chronic obstructive pulmonary disease) ([facility])   . Diabetes mellitus without complication ([place])   . Hyperlipidemia   . Hypertension   . OSA (obstructive sleep apnea)     Past Surgical History:  Procedure Laterality Date  . HAND SURGERY Left   . KNEE SURGERY Left     Prior to Admission medications   Medication Sig Start Date End Date Taking? Authorizing Provider  albuterol (PROVENTIL HFA;VENTOLIN HFA) 108 (90 Base) MCG/ACT inhaler INHALE 2 PUFFS BY MOUTH FOUR TIMES DAILY AS NEEDED 01/17/18  Yes Laverle Hobby, MD  amLODipine (NORVASC) 10 MG tablet TAKE (1) TABLET BY MOUTH DAILY FOR HIGH BLOOD PRESSURE. 07/26/17  Yes Sowles, Drue Stager, MD  aspirin 81 MG tablet Take 81 mg by mouth. 05/19/12  Yes [provider]  atorvastatin (LIPITOR) 20 MG tablet Take 1 tablet (20 mg total) by mouth at bedtime. 08/14/17  Yes Lada, Satira Anis, MD  glucose blood (ONETOUCH VERIO) test strip  03/27/14  Yes [provider]  Glycopyrrolate-Formoterol (BEVESPI AEROSPHERE) 9-4.8 MCG/ACT AERO Inhale 2 puffs into the lungs 2 (two) times daily. 10/11/17  Yes Lada, Satira Anis, MD  linagliptin (TRADJENTA) 5 MG TABS tablet Take 1 tablet (5 mg total) by mouth daily. 08/14/17  Yes Lada, Satira Anis, MD  losartan-hydrochlorothiazide (HYZAAR) 100-12.5 MG tablet TAKE 1 TABLET BY MOUTH ONCE DAILY. 11/20/17  Yes Poulose, Bethel Born, NP  metoprolol tartrate (LOPRESSOR) 100 MG tablet Take 1 tablet (100 mg total) by mouth 2 (two) times daily. 08/14/17  Yes Lada, Satira Anis, MD  pioglitazone (ACTOS) 15 MG tablet TAKE 1 TABLET BY MOUTH ONCE  DAILY. 11/20/17  Yes Poulose, Bethel Born, NP  sildenafil (VIAGRA) 100 MG tablet Take 1 tablet (100 mg total) by mouth daily. 08/14/17  Yes Arnetha Courser, MD    Allergies as of 12/31/2017  . (No Known Allergies)    Family History  Problem Relation Age of Onset  . Clotting disorder Mother   . Diabetes Mother   . Hypertension Mother   . Heart attack Father   . Hypertension Sister   . Hypertension Brother   . Vision loss Brother     Social History   Socioeconomic History  . Marital status: Divorced    Spouse name: Not on file  . Number of children: Not on file  . Years of education: Not on file  . Highest education level: Not on file  Occupational History  . Not on file  Social Needs  . Financial resource strain: Not on file  . Food insecurity:    Worry: Not on file    Inability: Not on file  . Transportation needs:    Medical: Not on file    Non-medical: Not on file  Tobacco Use  . Smoking status: Current Every Day Smoker    Packs/day: 1.00    Years: 41.00    Pack years: 41.00  . Smokeless tobacco: Never Used  Substance and Sexual Activity  . Alcohol use: No    Alcohol/week: 0.0 standard drinks  . Drug use: No  . Sexual activity: Yes  Lifestyle  .  Physical activity:    Days per week: Not on file    Minutes per session: Not on file  . Stress: Not on file  Relationships  . Social connections:    Talks on phone: Not on file    Gets together: Not on file    Attends religious service: Not on file    Active member of club or organization: Not on file    Attends meetings of clubs or organizations: Not on file    Relationship status: Not on file  . Intimate partner violence:    Fear of current or ex partner: Not on file    Emotionally abused: Not on file    Physically abused: Not on file    Forced sexual activity: Not on file  Other Topics Concern  . Not on file  Social History Narrative  . Not on file    Review of Systems: See HPI, otherwise negative  ROS  Physical Exam: BP (!) 146/92   Pulse 62   Temp (!) 96.4 F (35.8 C)   Resp 16   Ht 5\' 6"  (1.676 m)   Wt 86.2 kg   SpO2 95%   BMI 30.67 kg/m  General:   Alert,  pleasant and cooperative in NAD Head:  Normocephalic and atraumatic. Neck:  Supple; no masses or thyromegaly. Lungs:  Clear throughout to auscultation.    Heart:  Regular rate and rhythm. Abdomen:  Soft, nontender and nondistended. Normal bowel sounds, without guarding, and without rebound.   Neurologic:  Alert and  oriented x4;  grossly normal neurologically.  Impression/Plan: JUEL BELLEROSE is here for an colonoscopy to be performed for h/o colon polyp  Risks, benefits, limitations, and alternatives regarding  colonoscopy have been reviewed with the patient.  Questions have been answered.  All parties agreeable.   Sherri Sear, MD  02/04/2018, 1:25 PM

## 2018-02-04 NOTE — Transfer of Care (Signed)
Immediate Anesthesia Transfer of Care Note  Patient: Zachary Wallace  Procedure(s) Performed: COLONOSCOPY WITH PROPOFOL (N/A )  Patient Location: PACU  Anesthesia Type:General  Level of Consciousness: awake, alert  and oriented  Airway & Oxygen Therapy: Patient Spontanous Breathing and Patient connected to nasal cannula oxygen  Post-op Assessment: Report given to RN and Post -op Vital signs reviewed and stable  Post vital signs: Reviewed and stable  Last Vitals:  Vitals Value Taken Time  BP 88/64 02/04/2018  1:49 PM  Temp 36.1 C 02/04/2018  1:48 PM  Pulse 72 02/04/2018  1:49 PM  Resp 33 02/04/2018  1:49 PM  SpO2 98 % 02/04/2018  1:49 PM  Vitals shown include unvalidated device data.  Last Pain:  Vitals:   02/04/18 1348  TempSrc: Tympanic  PainSc: 0-No pain         Complications: No apparent anesthesia complications

## 2018-02-04 NOTE — Op Note (Signed)
[facility] Gastroenterology Patient Name: Zachary Wallace Procedure Date: 02/04/2018 1:25 PM MRN: [id number] Account #: [id number] Date of Birth: [birth year] Admit Type: Outpatient Age: 62 Room: [facility] ENDO ROOM 4 Gender: Male Note Status: Finalized Procedure:            Colonoscopy Indications:          High risk colon cancer surveillance: Personal history                        of colonic polyps, Last colonoscopy: October 2015 Providers:            Lin Landsman MD, MD Referring MD:         Arnetha Courser (Referring MD) Medicines:            Monitored Anesthesia Care Complications:        No immediate complications. Estimated blood loss: None. Procedure:            Pre-Anesthesia Assessment:                       - Prior to the procedure, a History and Physical was                        performed, and patient medications and allergies were                        reviewed. The patient is competent. The risks and                        benefits of the procedure and the sedation options and                        risks were discussed with the patient. All questions                        were answered and informed consent was obtained.                        Patient identification and proposed procedure were                        verified by the physician, the nurse, the                        anesthesiologist, the anesthetist and the technician in                        the pre-procedure area in the procedure room in the                        endoscopy suite. Mental Status Examination: alert and                        oriented. Airway Examination: normal oropharyngeal                        airway and neck mobility. Respiratory Examination:                        clear to auscultation. CV Examination: normal.  Prophylactic Antibiotics: The patient does not require                        prophylactic antibiotics. Prior Anticoagulants: The                         patient has taken no previous anticoagulant or                        antiplatelet agents. ASA Grade Assessment: II - A                        patient with mild systemic disease. After reviewing the                        risks and benefits, the patient was deemed in                        satisfactory condition to undergo the procedure. The                        anesthesia plan was to use monitored anesthesia care                        (MAC). Immediately prior to administration of                        medications, the patient was re-assessed for adequacy                        to receive sedatives. The heart rate, respiratory rate,                        oxygen saturations, blood pressure, adequacy of                        pulmonary ventilation, and response to care were                        monitored throughout the procedure. The physical status                        of the patient was re-assessed after the procedure.                       After obtaining informed consent, the colonoscope was                        passed under direct vision. Throughout the procedure,                        the patient's blood pressure, pulse, and oxygen                        saturations were monitored continuously. The                        Colonoscope was introduced through the anus and  advanced to the the cecum, identified by appendiceal                        orifice and ileocecal valve. The colonoscopy was                        performed without difficulty. The patient tolerated the                        procedure well. The quality of the bowel preparation                        was evaluated using the BBPS [facility] Bowel Preparation                        Scale) with scores of: Right Colon = 3, Transverse                        Colon = 3 and Left Colon = 3 (entire mucosa seen well                        with no residual staining, small fragments  of stool or                        opaque liquid). The total BBPS score equals 9. Findings:      Multiple diverticula were found in the sigmoid colon and ascending colon.      The exam was otherwise without abnormality.      The retroflexed view of the distal rectum and anal verge was normal and       showed no anal or rectal abnormalities. Impression:           - Diverticulosis in the Ascending and sigmoid colon.                       - The examination was otherwise normal.                       - The distal rectum and anal verge are normal on                        retroflexion view.                       - No specimens collected. Recommendation:       - Discharge patient to home (with spouse).                       - High fiber diet.                       - Continue present medications.                       - Repeat colonoscopy in 10 years for surveillance.                       - Return to my office as previously scheduled. Procedure Code(s):    --- Professional ---  G0105, Colorectal cancer screening; colonoscopy on                        individual at high risk Diagnosis Code(s):    --- Professional ---                       Z86.010, Personal history of colonic polyps                       K57.30, Diverticulosis of large intestine without                        perforation or abscess without bleeding CPT copyright 2018 [employer]. All rights reserved. The codes documented in this report are preliminary and upon coder review may  be revised to meet current compliance requirements. Dr. Ulyess Mort Lin Landsman MD, MD 02/04/2018 1:45:26 PM This report has been signed electronically. Number of Addenda: 0 Note Initiated On: 02/04/2018 1:25 PM Scope Withdrawal Time: 0 hours 8 minutes 30 seconds  Total Procedure Duration: 0 hours 10 minutes 31 seconds       [facility]

## 2018-02-04 NOTE — Anesthesia Preprocedure Evaluation (Signed)
Anesthesia Evaluation  Patient identified by MRN, date of birth, ID band Patient awake    Reviewed: Allergy & Precautions, NPO status , Patient's Chart, lab work & pertinent test results  History of Anesthesia Complications Negative for: history of anesthetic complications  Airway Mallampati: III  TM Distance: >3 FB Neck ROM: Full    Dental  (+) Upper Dentures, Poor Dentition, Missing   Pulmonary sleep apnea , COPD, Current Smoker,    breath sounds clear to auscultation- rhonchi (-) wheezing      Cardiovascular hypertension, Pt. on medications (-) CAD, (-) Past MI, (-) Cardiac Stents and (-) CABG  Rhythm:Regular Rate:Normal - Systolic murmurs and - Diastolic murmurs    Neuro/Psych negative psych ROS   GI/Hepatic negative GI ROS, Neg liver ROS,   Endo/Other  diabetes, Oral Hypoglycemic Agents  Renal/GU Renal InsufficiencyRenal disease     Musculoskeletal negative musculoskeletal ROS (+)   Abdominal (+) + obese,   Peds  Hematology negative hematology ROS (+)   Anesthesia Other Findings Past Medical History: No date: COPD (chronic obstructive pulmonary disease) (HCC) No date: Diabetes mellitus without complication (HCC) No date: Hyperlipidemia No date: Hypertension No date: OSA (obstructive sleep apnea)   Reproductive/Obstetrics                             Anesthesia Physical Anesthesia Plan  ASA: III  Anesthesia Plan: General   Post-op Pain Management:    Induction: Intravenous  PONV Risk Score and Plan: 0 and Propofol infusion  Airway Management Planned: Natural Airway  Additional Equipment:   Intra-op Plan:   Post-operative Plan:   Informed Consent: I have reviewed the patients History and Physical, chart, labs and discussed the procedure including the risks, benefits and alternatives for the proposed anesthesia with the patient or authorized representative who has  indicated his/her understanding and acceptance.   Dental advisory given  Plan Discussed with: CRNA and Anesthesiologist  Anesthesia Plan Comments:         Anesthesia Quick Evaluation

## 2018-02-05 ENCOUNTER — Encounter: Payer: Self-pay | Admitting: Gastroenterology

## 2018-02-14 ENCOUNTER — Ambulatory Visit: Payer: BC Managed Care – PPO | Admitting: Family Medicine

## 2018-02-21 ENCOUNTER — Telehealth: Payer: Self-pay

## 2018-02-21 NOTE — Telephone Encounter (Signed)
Call pt regarding lung screening. Per pt wants to call us when he ready to schedule. I gave pt phone number to call when he get ready to make appointment.

## 2018-02-24 ENCOUNTER — Other Ambulatory Visit: Payer: Self-pay

## 2018-02-24 MED ORDER — LOSARTAN POTASSIUM-HCTZ 100-12.5 MG PO TABS: 1.0000 | ORAL_TABLET | Freq: Every day | ORAL | 0 refills | Status: DC

## 2018-02-24 NOTE — Telephone Encounter (Signed)
Refill request for Hypertension medication:  Losartan-HCTZ  Last office visit pertaining to hypertension: 12/02/17  BP Readings from Last 3 Encounters:  02/04/18 (!) 137/92  12/31/17 130/85  12/02/17 102/74     Lab Results  Component Value Date   CREATININE 2.34 (H) 12/02/2017   BUN 20 12/02/2017   NA 137 12/02/2017   K 3.7 12/02/2017   CL 99 12/02/2017   CO2 31 12/02/2017   Follow up: 03/06/2018

## 2018-02-24 NOTE — Telephone Encounter (Signed)
Left detailed voicemail

## 2018-02-24 NOTE — Telephone Encounter (Signed)
Last lab reviewed; kidney function declined; labs were to be faxed to his kidney doctor I just checked [facility] notes; no visit with kidney doctor since April 2019 I'm going to respectfully ask that the kidney doctor take over this medicine (losartan-hctz) going forward with his kidney function decline I have approved a 30 day supply and we'll have him follow-up with his kidney doctor and see her for further refills Thank you

## 2018-02-26 ENCOUNTER — Telehealth: Payer: Self-pay | Admitting: *Deleted

## 2018-02-26 NOTE — Telephone Encounter (Signed)
Left message for patient to notify them that it is time to schedule annual low dose lung cancer screening CT scan. Instructed patient to call back to verify information prior to the scan being scheduled.  

## 2018-03-01 ENCOUNTER — Telehealth: Payer: Self-pay

## 2018-03-01 NOTE — Telephone Encounter (Signed)
Call pt regarding lung screening. Pt is a current smoker, smoking 1 pack every 2 day. Would like to have morning appointment any day.

## 2018-03-03 ENCOUNTER — Telehealth: Payer: Self-pay | Admitting: *Deleted

## 2018-03-03 DIAGNOSIS — Z87891 Personal history of nicotine dependence: Secondary | ICD-10-CM

## 2018-03-03 DIAGNOSIS — Z122 Encounter for screening for malignant neoplasm of respiratory organs: Secondary | ICD-10-CM

## 2018-03-03 NOTE — Telephone Encounter (Signed)
Patient has been notified that annual lung cancer screening low dose CT scan is due currently or will be in near future. Confirmed that patient is within the age range of 55-77, and asymptomatic, (no signs or symptoms of lung cancer). Patient denies illness that would prevent curative treatment for lung cancer if found. Verified smoking history, (current, 41.5 pack year). The shared decision making visit was done 03/08/17. Patient is agreeable for CT scan being scheduled.

## 2018-03-06 ENCOUNTER — Ambulatory Visit: Payer: BC Managed Care – PPO | Admitting: Family Medicine

## 2018-03-06 ENCOUNTER — Encounter: Payer: Self-pay | Admitting: Family Medicine

## 2018-03-06 VITALS — BP 118/74 | HR 55 | Temp 97.7°F | Ht 68.0 in | Wt 189.1 lb

## 2018-03-06 DIAGNOSIS — E781 Pure hyperglyceridemia: Secondary | ICD-10-CM

## 2018-03-06 DIAGNOSIS — R0602 Shortness of breath: Secondary | ICD-10-CM | POA: Diagnosis not present

## 2018-03-06 DIAGNOSIS — Z720 Tobacco use: Secondary | ICD-10-CM | POA: Diagnosis not present

## 2018-03-06 DIAGNOSIS — I10 Essential (primary) hypertension: Secondary | ICD-10-CM | POA: Diagnosis not present

## 2018-03-06 DIAGNOSIS — J42 Unspecified chronic bronchitis: Secondary | ICD-10-CM | POA: Diagnosis not present

## 2018-03-06 DIAGNOSIS — Z794 Long term (current) use of insulin: Secondary | ICD-10-CM

## 2018-03-06 DIAGNOSIS — N183 Chronic kidney disease, stage 3 unspecified: Secondary | ICD-10-CM

## 2018-03-06 DIAGNOSIS — E786 Lipoprotein deficiency: Secondary | ICD-10-CM

## 2018-03-06 DIAGNOSIS — E1165 Type 2 diabetes mellitus with hyperglycemia: Secondary | ICD-10-CM

## 2018-03-06 MED ORDER — PREDNISONE 20 MG PO TABS: 40.0000 mg | ORAL_TABLET | Freq: Every day | ORAL | 0 refills | Status: DC

## 2018-03-06 NOTE — Assessment & Plan Note (Signed)
Refer to Dr. Ashby Dawes for evaluation and treatment; urged patient to quit smoking; showed the Fletcher-Peto curve; disability and premature death to follow without cessation; he'll try to decrease one cigarette a day

## 2018-03-06 NOTE — Assessment & Plan Note (Signed)
Encouraged complete cessation; >3 minutes spent counseling

## 2018-03-06 NOTE — Assessment & Plan Note (Signed)
Managed by endo; foot exam by MD today

## 2018-03-06 NOTE — Assessment & Plan Note (Signed)
Controlled today; continue meds; limit salt

## 2018-03-06 NOTE — Patient Instructions (Addendum)
Try to use PLAIN allergy or cold medicine without the decongestant Avoid: phenylephrine, phenylpropanolamine, and pseudoephredine  Try to follow the DASH guidelines (DASH stands for Dietary Approaches to Stop Hypertension). Try to limit the sodium in your diet to no more than 1,500mg  of sodium per day. Certainly try to not exceed 2,000 mg per day at the very most. Do not add salt when cooking or at the table.  Check the sodium amount on labels when shopping, and choose items lower in sodium when given a choice. Avoid or limit foods that already contain a lot of sodium. Eat a diet rich in fruits and vegetables and whole grains, and try to lose weight if overweight or obese  I do encourage you to quit smoking Call [phone] to sign up for smoking cessation classes You can call 1-800-QUIT-NOW to talk with a smoking cessation coach   Steps to Quit Smoking Smoking tobacco can be bad for your health. It can also affect almost every organ in your body. Smoking puts you and people around you at risk for many serious long-lasting (chronic) diseases. Quitting smoking is hard, but it is one of the best things that you can do for your health. It is never too late to quit. What are the benefits of quitting smoking? When you quit smoking, you lower your risk for getting serious diseases and conditions. They can include:  Lung cancer or lung disease.  Heart disease.  Stroke.  Heart attack.  Not being able to have children (infertility).  Weak bones (osteoporosis) and broken bones (fractures).  If you have coughing, wheezing, and shortness of breath, those symptoms may get better when you quit. You may also get sick less often. If you are pregnant, quitting smoking can help to lower your chances of having a baby of low birth weight. What can I do to help me quit smoking? Talk with your doctor about what can help you quit smoking. Some things you can do (strategies) include:  Quitting smoking  totally, instead of slowly cutting back how much you smoke over a period of time.  Going to in-person counseling. You are more likely to quit if you go to many counseling sessions.  Using resources and support systems, such as: ? Database administrator with a Social worker. ? Phone quitlines. ? Careers information officer. ? Support groups or group counseling. ? Text messaging programs. ? Mobile phone apps or applications.  Taking medicines. Some of these medicines may have nicotine in them. If you are pregnant or breastfeeding, do not take any medicines to quit smoking unless your doctor says it is okay. Talk with your doctor about counseling or other things that can help you.  Talk with your doctor about using more than one strategy at the same time, such as taking medicines while you are also going to in-person counseling. This can help make quitting easier. What things can I do to make it easier to quit? Quitting smoking might feel very hard at first, but there is a lot that you can do to make it easier. Take these steps:  Talk to your family and friends. Ask them to support and encourage you.  Call phone quitlines, reach out to support groups, or work with a Social worker.  Ask people who smoke to not smoke around you.  Avoid places that make you want (trigger) to smoke, such as: ? Bars. ? Parties. ? Smoke-break areas at work.  Spend time with people who do not smoke.  Lower the stress  in your life. Stress can make you want to smoke. Try these things to help your stress: ? Getting regular exercise. ? Deep-breathing exercises. ? Yoga. ? Meditating. ? Doing a body scan. To do this, close your eyes, focus on one area of your body at a time from head to toe, and notice which parts of your body are tense. Try to relax the muscles in those areas.  Download or buy apps on your mobile phone or tablet that can help you stick to your quit plan. There are many free apps, such as QuitGuide from the [employer]  Office manager for Disease Control and Prevention). You can find more support from smokefree.gov and other websites.  This information is not intended to replace advice given to you by your health care provider. Make sure you discuss any questions you have with your health care provider. Document Released: 01/06/2009 Document Revised: 11/08/2015 Document Reviewed: 07/27/2014 Elsevier Interactive Patient Education  2018 [place] with Quitting Smoking Quitting smoking is a physical and mental challenge. You will face cravings, withdrawal symptoms, and temptation. Before quitting, work with your health care provider to make a plan that can help you cope. Preparation can help you quit and keep you from giving in. How can I cope with cravings? Cravings usually last for 5-10 minutes. If you get through it, the craving will pass. Consider taking the following actions to help you cope with cravings:  Keep your mouth busy: ? Chew sugar-free gum. ? Suck on hard candies or a straw. ? Brush your teeth.  Keep your hands and body busy: ? Immediately change to a different activity when you feel a craving. ? Squeeze or play with a ball. ? Do an activity or a hobby, like making bead jewelry, practicing needlepoint, or working with wood. ? Mix up your normal routine. ? Take a short exercise break. Go for a quick walk or run up and down stairs. ? Spend time in public places where smoking is not allowed.  Focus on doing something kind or helpful for someone else.  Call a friend or family member to talk during a craving.  Join a support group.  Call a quit line, such as 1-800-QUIT-NOW.  Talk with your health care provider about medicines that might help you cope with cravings and make quitting easier for you.  How can I deal with withdrawal symptoms? Your body may experience negative effects as it tries to get used to not having nicotine in the system. These effects are called withdrawal  symptoms. They may include:  Feeling hungrier than normal.  Trouble concentrating.  Irritability.  Trouble sleeping.  Feeling depressed.  Restlessness and agitation.  Craving a cigarette.  To manage withdrawal symptoms:  Avoid places, people, and activities that trigger your cravings.  Remember why you want to quit.  Get plenty of sleep.  Avoid coffee and other caffeinated drinks. These may worsen some of your symptoms.  How can I handle social situations? Social situations can be difficult when you are quitting smoking, especially in the first few weeks. To manage this, you can:  Avoid parties, bars, and other social situations where people might be smoking.  Avoid alcohol.  Leave right away if you have the urge to smoke.  Explain to your family and friends that you are quitting smoking. Ask for understanding and support.  Plan activities with friends or family where smoking is not an option.  What are some ways I can cope with stress? Wanting  to smoke may cause stress, and stress can make you want to smoke. Find ways to manage your stress. Relaxation techniques can help. For example:  Breathe slowly and deeply, in through your nose and out through your mouth.  Listen to soothing, relaxing music.  Talk with a family member or friend about your stress.  Light a candle.  Soak in a bath or take a shower.  Think about a peaceful place.  What are some ways I can prevent weight gain? Be aware that many people gain weight after they quit smoking. However, not everyone does. To keep from gaining weight, have a plan in place before you quit and stick to the plan after you quit. Your plan should include:  Having healthy snacks. When you have a craving, it may help to: ? Eat plain popcorn, crunchy carrots, celery, or other cut vegetables. ? Chew sugar-free gum.  Changing how you eat: ? Eat small portion sizes at meals. ? Eat 4-6 small meals throughout the day  instead of 1-2 large meals a day. ? Be mindful when you eat. Do not watch television or do other things that might distract you as you eat.  Exercising regularly: ? Make time to exercise each day. If you do not have time for a long workout, do short bouts of exercise for 5-10 minutes several times a day. ? Do some form of strengthening exercise, like weight lifting, and some form of aerobic exercise, like running or swimming.  Drinking plenty of water or other low-calorie or no-calorie drinks. Drink 6-8 glasses of water daily, or as much as instructed by your health care provider.  Summary  Quitting smoking is a physical and mental challenge. You will face cravings, withdrawal symptoms, and temptation to smoke again. Preparation can help you as you go through these challenges.  You can cope with cravings by keeping your mouth busy (such as by chewing gum), keeping your body and hands busy, and making calls to family, friends, or a helpline for people who want to quit smoking.  You can cope with withdrawal symptoms by avoiding places where people smoke, avoiding drinks with caffeine, and getting plenty of rest.  Ask your health care provider about the different ways to prevent weight gain, avoid stress, and handle social situations. This information is not intended to replace advice given to you by your health care provider. Make sure you discuss any questions you have with your health care provider. Document Released: 03/09/2016 Document Revised: 03/09/2016 Document Reviewed: 03/09/2016 Elsevier Interactive Patient Education  [employer].

## 2018-03-06 NOTE — Progress Notes (Signed)
BP 118/74   Pulse (!) 55   Temp 97.7 F (36.5 C)   Ht 5\' 8"  (1.727 m)   Wt 189 lb 1.6 oz (85.8 kg)   SpO2 95%   BMI 28.75 kg/m    Subjective:    Patient ID: Zachary Wallace, male    DOB: 06-02-1955, 62 y.o.   MRN: [id number]  HPI: Zachary Wallace is a 62 y.o. male  Chief Complaint  Patient presents with  . Follow-up    HPI Patient is here for f/u  Upon check-in, pulse ox only 90% for about ten minutes Having some SHOB; it's been going on for a while Having some cough; productive of white sputum; no fevers; having some wheezing He smokes 1/2 ppd; he has never tried to quit before; smoking since age 73; no real time around other smokers First cig is right away; last cig of the day is right before; not waking up in the middle of the night for a cig Using SABA "right much"; using Bevespi BID regularly  Type 2 diabetes; no new foot problems; managed by NP Zachary Wallace at [place]; on insulin; checking sugars BD; last week's range 72-130  Lab Results  Component Value Date   HGBA1C 10.6 (H) 12/02/2017    HTN; does add some salt; taking medicine every day  CKD; seeing Zachary Wallace; avoiding NSAIDs  Low HDL; smoking; LDL is excellent  Lab Results  Component Value Date   CHOL 79 12/02/2017   HDL 29 (L) 12/02/2017   LDLCALC 28 12/02/2017   TRIG 141 12/02/2017   CHOLHDL 2.7 12/02/2017     Depression screen PHQ 2/9 03/06/2018 12/02/2017 08/14/2017 02/13/2017 10/05/2016  Decreased Interest 0 0 0 0 0  Down, Depressed, Hopeless 0 0 0 0 0  PHQ - 2 Score 0 0 0 0 0  Altered sleeping 0 - - - -  Tired, decreased energy 0 - - - -  Change in appetite 0 - - - -  Feeling bad or failure about yourself  0 - - - -  Trouble concentrating 0 - - - -  Moving slowly or fidgety/restless 0 - - - -  Suicidal thoughts 0 - - - -  PHQ-9 Score 0 - - - -  Difficult doing work/chores Not difficult at all - - - -   Fall Risk  03/06/2018 12/02/2017 08/14/2017 02/13/2017 10/05/2016  Falls in the past year? 0  No No No No    Relevant past medical, surgical, family and social history reviewed Past Medical History:  Diagnosis Date  . COPD (chronic obstructive pulmonary disease) ([place])   . Diabetes mellitus without complication ([place])   . Hyperlipidemia   . Hypertension   . OSA (obstructive sleep apnea)    Past Surgical History:  Procedure Laterality Date  . COLONOSCOPY WITH PROPOFOL N/A 02/04/2018   Procedure: COLONOSCOPY WITH PROPOFOL;  Surgeon: Lin Landsman, MD;  Location: [facility] ENDOSCOPY;  Service: Gastroenterology;  Laterality: N/A;  . HAND SURGERY Left   . Wallace SURGERY Left    Family History  Problem Relation Age of Onset  . Clotting disorder Mother   . Diabetes Mother   . Hypertension Mother   . Heart attack Father   . Hypertension Sister   . Hypertension Brother   . Vision loss Brother    Social History   Tobacco Use  . Smoking status: Current Every Day Smoker    Packs/day: 1.00    Years: 41.00    Pack  years: 41.00  . Smokeless tobacco: Never Used  Substance Use Topics  . Alcohol use: No    Alcohol/week: 0.0 standard drinks  . Drug use: No     Office Visit from 03/06/2018 in [facility]  AUDIT-C Score  0      Interim medical history since last visit reviewed. Allergies and medications reviewed  Review of Systems Per HPI unless specifically indicated above     Objective:    BP 118/74   Pulse (!) 55   Temp 97.7 F (36.5 C)   Ht 5\' 8"  (1.727 m)   Wt 189 lb 1.6 oz (85.8 kg)   SpO2 95%   BMI 28.75 kg/m   Wt Readings from Last 3 Encounters:  03/06/18 189 lb 1.6 oz (85.8 kg)  02/04/18 190 lb (86.2 kg)  12/31/17 189 lb 6.4 oz (85.9 kg)    Physical Exam Constitutional:      General: He is not in acute distress.    Appearance: He is well-developed.  HENT:     Head: Normocephalic and atraumatic.  Eyes:     General: No scleral icterus. Neck:     Thyroid: No thyromegaly.  Cardiovascular:     Rate and Rhythm: Regular rhythm.  Bradycardia present.  Pulmonary:     Effort: Pulmonary effort is normal.     Breath sounds: Decreased breath sounds and wheezing present.  Abdominal:     General: Bowel sounds are normal. There is no distension.     Palpations: Abdomen is soft.  Skin:    General: Skin is warm and dry.     Coloration: Skin is not pale.  Neurological:     Coordination: Coordination normal.  Psychiatric:        Behavior: Behavior normal.        Thought Content: Thought content normal.        Judgment: Judgment normal.    Diabetic Foot Form - Detailed   Diabetic Foot Exam - detailed Diabetic Foot exam was performed with the following findings:  Yes 03/07/2018  1:30 PM  Visual Foot Exam completed.:  Yes  Pulse Foot Exam completed.:  Yes  Right Dorsalis Pedis:  Present Left Dorsalis Pedis:  Present  Sensory Foot Exam Completed.:  Yes Semmes-Weinstein Monofilament Test R Site 1-Great Toe:  Pos L Site 1-Great Toe:  Pos         Results for orders placed or performed during the hospital encounter of 02/04/18  Glucose, capillary  Result Value Ref Range   Glucose-Capillary 122 (H) 70 - 99 mg/dL   Comment 1 IN EPIC       Assessment & Plan:   Problem List Items Addressed This Visit      Cardiovascular and Mediastinum   BP (high blood pressure) (Chronic)    Controlled today; continue meds; limit salt        Respiratory   COPD (chronic obstructive pulmonary disease) ([place]) - Primary (Chronic)    Refer to Dr. Ashby Wallace for evaluation and treatment; urged patient to quit smoking; showed the Fletcher-Peto curve; disability and premature death to follow without cessation; he'll try to decrease one cigarette a day      Relevant Medications   predniSONE (DELTASONE) 20 MG tablet   Other Relevant Orders   Ambulatory referral to Pulmonology     Endocrine   Diabetes mellitus ([place]) (Chronic)    Managed by endo; foot exam by MD today      Relevant Medications   insulin degludec (  TRESIBA  FLEXTOUCH) 100 UNIT/ML SOPN FlexTouch Pen     Genitourinary   Chronic kidney disease (CKD), stage III (moderate) (HCC) (Chronic)    Nephrologist checks labs; avoiding NSAIDs; staying hydrated        Other   Tobacco abuse    Encouraged complete cessation; >3 minutes spent counseling      Relevant Orders   Ambulatory referral to Pulmonology   Low HDL (under 40)    He'll try his best to quit smoking which should improve that HDL; low HDL is associated with heart attacks      High triglycerides    Under control       Other Visit Diagnoses    Shortness of breath       Relevant Orders   Ambulatory referral to Pulmonology       Follow up plan: Return in about 3 months (around 06/03/2018) for follow-up visit with Dr. Sanda Klein (or after).  An after-visit summary was printed and given to the patient at [place].  Please see the patient instructions which may contain other information and recommendations beyond what is mentioned above in the assessment and plan.  Meds ordered this encounter  Medications  . predniSONE (DELTASONE) 20 MG tablet    Sig: Take 2 tablets (40 mg total) by mouth daily with breakfast.    Dispense:  10 tablet    Refill:  0    Orders Placed This Encounter  Procedures  . Ambulatory referral to Pulmonology

## 2018-03-06 NOTE — Assessment & Plan Note (Signed)
He'll try his best to quit smoking which should improve that HDL; low HDL is associated with heart attacks

## 2018-03-06 NOTE — Assessment & Plan Note (Signed)
Under control 

## 2018-03-06 NOTE — Assessment & Plan Note (Signed)
Nephrologist checks labs; avoiding NSAIDs; staying hydrated

## 2018-03-07 ENCOUNTER — Telehealth: Payer: Self-pay | Admitting: Family Medicine

## 2018-03-07 NOTE — Telephone Encounter (Signed)
Copied from [place] [phone] [phone]. Topic: Quick Communication - Rx Refill/Question >> Mar 07, 2018 10:12 AM Margot Ables wrote: Medication: predniSONE (DELTASONE) 20 MG tablet - pt states pharmacy advised him they were having fax trouble yesterday and asking that RX be resent   Has the patient contacted their pharmacy? yes Preferred Pharmacy (with phone number or street name): [place], [place] [phone] (Phone) [phone] (Fax)

## 2018-03-07 NOTE — Telephone Encounter (Signed)
Left voicemail with pharmacy. Prescription called in.

## 2018-03-10 ENCOUNTER — Ambulatory Visit: Admission: RE | Admit: 2018-03-10 | Payer: BC Managed Care – PPO | Source: Ambulatory Visit

## 2018-03-11 ENCOUNTER — Telehealth: Payer: Self-pay | Admitting: *Deleted

## 2018-03-11 ENCOUNTER — Encounter: Payer: Self-pay | Admitting: *Deleted

## 2018-03-11 NOTE — Telephone Encounter (Signed)
Attempted to contact patient r/t LDCT Screening follow up due at this time. Attempted to contact patient r/t LDCT Screening follow up due at this time.  No answer received, unable to leave message at this time,letter sent

## 2018-03-27 ENCOUNTER — Other Ambulatory Visit: Payer: Self-pay | Admitting: Family Medicine

## 2018-03-27 DIAGNOSIS — I10 Essential (primary) hypertension: Secondary | ICD-10-CM

## 2018-03-28 ENCOUNTER — Other Ambulatory Visit: Payer: Self-pay | Admitting: Family Medicine

## 2018-03-28 DIAGNOSIS — J42 Unspecified chronic bronchitis: Secondary | ICD-10-CM

## 2018-03-28 NOTE — Telephone Encounter (Signed)
Copied from [place] [id number]. Topic: Quick Communication - Rx Refill/Question >> Mar 28, 2018 10:07 AM Rayann Heman wrote: Medication:albuterol (PROVENTIL HFA;VENTOLIN HFA) 108 (90 Base) MCG/ACT inhaler [[id number]]   Has the patient contacted their pharmacy? yes Preferred Pharmacy (with phone number or street name): [place], [place] [phone] (Phone) [phone] (Fax)    Agent: Please be advised that RX refills may take up to 3 business days. We ask that you follow-up with your pharmacy.

## 2018-03-31 ENCOUNTER — Other Ambulatory Visit: Payer: Self-pay | Admitting: Family Medicine

## 2018-03-31 DIAGNOSIS — J42 Unspecified chronic bronchitis: Secondary | ICD-10-CM

## 2018-04-25 ENCOUNTER — Other Ambulatory Visit: Payer: Self-pay | Admitting: Family Medicine

## 2018-04-25 NOTE — Telephone Encounter (Signed)
Refill request for general medication: Amlodipine 10 mg  Last office visit: 03/06/2018  Last physical exam: 12/02/2017  Follow-ups on file. 06/05/2018

## 2018-06-04 DIAGNOSIS — G4733 Obstructive sleep apnea (adult) (pediatric): Secondary | ICD-10-CM | POA: Insufficient documentation

## 2018-06-04 NOTE — Patient Instructions (Addendum)
   Your oxygen saturations were initially low today prior to deep breathing and coughing. Please continue to work on cutting down your smoking to help improve your lung function. Please take good deep breaths to help your body get the oxygen it needs. For the next week take guaifenesin 600mg  every 12 hours to help you cough up your mucus. Take your albuterol inhaler every 6-8 hours as needed for mild shortness of breath, chest tightness or wheezing. This will help you be able to cough up your sputum. Please call the pulmonologist and schedule an appointment as soon as possible. If you develop worsening shortness of breath or cough, fevers, chills, or fatigue within the next week please let me know so I can order a chest x-ray and follow up with you sooner.  Please look through the information in the packet to learn more about lowering triglycerides. I also do recommend a once daily multi-vitamin with Vitamin D 1000-2000IU daily to make sure you are getting enough of what you need.   Your triglycerides are above goal: weight loss, aerobic exercise; avoiding concentrated sugars such as fruit juices, alcohol, and medications that raise serum triglyceride levels; and strict control of blood sugars in diabetics is first-line therapy. It is also helpful to eats fish  that contain high amounts of omega-3 fatty acids such as salmon, anchovies, tuna, sardines ect.

## 2018-06-04 NOTE — Progress Notes (Signed)
Name: Zachary Wallace   MRN: [id number]    DOB: 06-06-1955   Date:06/05/2018       Progress Note  Subjective  Chief Complaint  Chief Complaint  Patient presents with  . Follow-up    HPI  Essential hypertension Patient is rx amlodipine 10mg , losartan-HCTZ 100-12.5mg  and lopressor 100mg .  Takes these medications daily as prescribed without missed doses.  He does not check his blood pressures at home.  He does endorse eating a lot of sodium in his diet and fried foods.  He denies chest pain, blurry vision, headaches. has ED takes viagra as needed and this works well for him.  Chronic bronchitis, unspecified chronic bronchitis type ([place]) Patient is prescribed bevespi 2 puffs BID and alubterol PRN.  He states he takes albuterol maybe 2 or 3 times a week.  Denies shortness of breath or worsening of chronic cough.  States has a chronic daily muco-purulent cough.  Denies chest tightness or wheezing.  Does endorse mild progressive exertional shortness of breath.  Was referred to pulmonology states he needs to call them back to schedule appointment.  Type 2 diabetes mellitus with stage 3 chronic kidney disease, with long-term current use of insulin ([place]) He is prescribed Actos 15mg  daily, tradjenta 5mg  daily, and tresiba 22units increased from 19 last week. HbA1c 7.6 on 05/23/2018, down from 10.6% on 12/02/2017.  His fasting blood sugars in the last week of been between 90 and 130.  He has an eye exam scheduled today.  Nonintractable epilepsy without status epilepticus, unspecified epilepsy type ([place]) Diagnosed with seizures in his 78s. Has not had seizure activity in over 10 years. No on daily preventatives presently.   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 On hemodialysis for approximately 4 years, and has been off for the past 2 - 3 years.  Follows up with nephrology-  Sydell Axon NP last seen 02/2018 with 6 month routine follow-up. Last creatinine 1.9; GFR 44 on 05/23/2018. He does not take NSAIDs, does drink plenty of water.  Hyperlipidemia Patient takes 20mg  of atorvastatin nightly. Last lipids checked with endocrinology on 05/23/2018. LDL 34; Triglycerides 231; HDL 29  Denies myalgias endorses eating fried fatty foods.  States " I like to eat good food".  Obstructive Sleep Apnea Wears CPAP- reported compliance 100%.  This works well for him.  Avitaminosis D Patient is not taking vitamin D supplementation anymore, was taking once weekly dose last year.  Tobacco abuse Reported cutting down from 1ppd to 0.5 ppd last month with plans to continue to decrease.  Got his CT lung screening completed 02/2017.    Patient Active Problem List   Diagnosis Date Noted  . OSA (obstructive sleep apnea) 06/04/2018  . Tubular adenoma of colon   . Preventative health care 12/02/2017  . Snoring 02/13/2017  . Low HDL (under 40) 02/13/2017  . Screening for prostate cancer 05/16/2016  . Tobacco abuse 05/16/2016  . High triglycerides 05/16/2016  . Medication monitoring encounter 08/16/2015  . COPD (chronic obstructive pulmonary disease) ([place]) 02/10/2015  . Lumbar radiculopathy 01/10/2015  . Left hip pain 01/10/2015  . Abnormal presence of protein in urine 10/06/2013  . Avitaminosis D 10/11/2011  . Diabetes mellitus ([place]) 10/11/2011  . Chronic kidney disease (CKD), stage III (moderate) ([place]) 08/15/2010  . Convulsions, epileptic ([place]) 08/15/2010  . BP (high blood pressure) 08/15/2010  . Epilepsy ([place]) 08/15/2010    Past Medical  History:  Diagnosis Date  . COPD (chronic obstructive pulmonary disease) ([place])   . Diabetes mellitus without complication ([place])   . Hyperlipidemia   . Hypertension   . OSA (obstructive sleep apnea)     Past Surgical History:  Procedure Laterality Date  . COLONOSCOPY WITH PROPOFOL N/A 02/04/2018   Procedure: COLONOSCOPY WITH PROPOFOL;   Surgeon: Lin Landsman, MD;  Location: [facility] ENDOSCOPY;  Service: Gastroenterology;  Laterality: N/A;  . HAND SURGERY Left   . KNEE SURGERY Left     Social History   Tobacco Use  . Smoking status: Current Every Day Smoker    Packs/day: 1.00    Years: 41.00    Pack years: 41.00  . Smokeless tobacco: Never Used  Substance Use Topics  . Alcohol use: No    Alcohol/week: 0.0 standard drinks     Current Outpatient Medications:  .  albuterol (PROVENTIL HFA;VENTOLIN HFA) 108 (90 Base) MCG/ACT inhaler, INHALE 2 PUFFS BY MOUTH FOUR TIMES DAILY AS NEEDED, Disp: 8.5 g, Rfl: 0 .  amLODipine (NORVASC) 10 MG tablet, TAKE (1) TABLET BY MOUTH DAILY FOR HIGH BLOOD PRESSURE., Disp: 90 tablet, Rfl: 1 .  aspirin 81 MG tablet, Take 81 mg by mouth., Disp: , Rfl:  .  atorvastatin (LIPITOR) 20 MG tablet, Take 1 tablet (20 mg total) by mouth at bedtime., Disp: 90 tablet, Rfl: 3 .  glucose blood (ONETOUCH VERIO) test strip, , Disp: , Rfl:  .  Glycopyrrolate-Formoterol (BEVESPI AEROSPHERE) 9-4.8 MCG/ACT AERO, Inhale 2 puffs into the lungs 2 (two) times daily., Disp: 1 Inhaler, Rfl: 11 .  insulin degludec (TRESIBA FLEXTOUCH) 100 UNIT/ML SOPN FlexTouch Pen, Inject into the skin., Disp: , Rfl:  .  linagliptin (TRADJENTA) 5 MG TABS tablet, Take 1 tablet (5 mg total) by mouth daily., Disp: 90 tablet, Rfl: 3 .  losartan-hydrochlorothiazide (HYZAAR) 100-12.5 MG tablet, Take 1 tablet by mouth daily., Disp: 30 tablet, Rfl: 0 .  metoprolol tartrate (LOPRESSOR) 100 MG tablet, Take 1 tablet (100 mg total) by mouth 2 (two) times daily., Disp: 180 tablet, Rfl: 3 .  pioglitazone (ACTOS) 15 MG tablet, TAKE 1 TABLET BY MOUTH ONCE DAILY., Disp: 30 tablet, Rfl: 0 .  predniSONE (DELTASONE) 20 MG tablet, Take 2 tablets (40 mg total) by mouth daily with breakfast., Disp: 10 tablet, Rfl: 0 .  sildenafil (VIAGRA) 100 MG tablet, Take 1 tablet (100 mg total) by mouth daily., Disp: 3 tablet, Rfl: 20  No Known  Allergies  ROS   No other specific complaints in a complete review of systems (except as listed in HPI above).  Objective  Vitals:   06/05/18 1017  BP: 114/68  Pulse: (!) 56  Resp: 12  Temp: 97.9 F (36.6 C)  TempSrc: Oral  SpO2: 96%  Weight: 196 lb 11.2 oz (89.2 kg)  Height: 5\' 8"  (1.727 m)    Body mass index is 29.91 kg/m.  Nursing Note and Vital Signs reviewed.  Physical Exam Constitutional:      Appearance: Normal appearance. He is not ill-appearing.  HENT:     Head: Normocephalic and atraumatic.     Right Ear: Hearing normal.     Left Ear: Hearing normal.     Nose: Congestion present.     Right Sinus: No maxillary sinus tenderness or frontal sinus tenderness.     Left Sinus: No maxillary sinus tenderness or frontal sinus tenderness.     Mouth/Throat:     Mouth: Mucous membranes are moist.     Pharynx:  Uvula midline. No oropharyngeal exudate or posterior oropharyngeal erythema.  Eyes:     General:        Right eye: No discharge.        Left eye: No discharge.     Conjunctiva/sclera: Conjunctivae normal.  Neck:     Musculoskeletal: Normal range of motion and neck supple.  Cardiovascular:     Rate and Rhythm: Normal rate and regular rhythm.     Pulses: Normal pulses.  Pulmonary:     Effort: Pulmonary effort is normal.     Breath sounds: Wheezing present.     Comments: Bilateral expiratory wheezing present throughout.  After DuoNeb treatment lungs clear bilaterally throughout Musculoskeletal: Normal range of motion.  Lymphadenopathy:     Cervical: No cervical adenopathy.  Skin:    General: Skin is warm and dry.     Coloration: Skin is not pale.     Findings: No rash.  Neurological:     General: No focal deficit present.     Mental Status: He is alert and oriented to person, place, and time.  Psychiatric:        Mood and Affect: Mood normal.        Behavior: Behavior normal.        Judgment: Judgment normal.     Diabetic Foot Exam - Simple    Simple Foot Form Diabetic Foot exam was performed with the following findings:  Yes 06/05/2018  9:06 AM  Visual Inspection No deformities, no ulcerations, no other skin breakdown bilaterally:  Yes Sensation Testing Intact to touch and monofilament testing bilaterally:  Yes Pulse Check Posterior Tibialis and Dorsalis pulse intact bilaterally:  Yes Comments       No results found for this or any previous visit (from the past 48 hour(s)).  Assessment & Plan  1. Essential hypertension Managed by nephrology; well controlled today, continue plan of care. Patient has asymptomatic mild bradycardia likely due to BB; educated on slowly changing positions and proper hydration. DASH guidelines discussed   2. Chronic bronchitis, unspecified chronic bronchitis type ([place]) Patient O2 Sats initially 90-92% after walking back to room; patient denies symptoms, with sitting and deep breathing able to come up to 95 %, after duoneb- wheezing resolved, patient ambulated around hallways and pulse ox maintained between 93-97%, recommend mucinex, humidifier, using albuterol PRN, and follow-up with pulmonology. If unable to get in with pulm we can order PFTs and trial trelegy. If having shortness of breath, fevers, chills, worsening cough, fatigue will order chest xray- patient presently asymptomatic will hold.  - albuterol (PROVENTIL HFA;VENTOLIN HFA) 108 (90 Base) MCG/ACT inhaler; Inhale 1-2 puffs into the lungs every 6 (six) hours as needed for wheezing or shortness of breath.  Dispense: 8.5 g; Refill: 0 - ipratropium-albuterol (DUONEB) 0.5-2.5 (3) MG/3ML nebulizer solution 3 mL  3. Type 2 diabetes mellitus with stage 3 chronic kidney disease, with long-term current use of insulin [facility]) Patient follows up with endocrinology Dr. Honor Junes, recently increased Tresiba dosage from 19-22 with improvement in blood sugars.  Discussed diet and importance of good control of sugars - atorvastatin (LIPITOR) 20 MG tablet;  Take 1 tablet (20 mg total) by mouth at bedtime.  Dispense: 90 tablet; Refill: 3  4. Nonintractable epilepsy without status epilepticus, unspecified epilepsy type ([place]) Stable  5. Chronic kidney disease (CKD), stage III (moderate) (HCC) Discussed avoiding nephrotoxic medications, importance of hydration and management of blood sugars.  Continue follow-up with nephrology  6. High triglycerides Discussed diet in detail. -  atorvastatin (LIPITOR) 20 MG tablet; Take 1 tablet (20 mg total) by mouth at bedtime.  Dispense: 90 tablet; Refill: 3  7. Avitaminosis D Recommend multivitamin with at least 1000 international units of vitamin D daily  8. Tobacco abuse Offered counseling on reduction of cigarettes-patient will try to spread out using 1 box of cigarettes over 3 days instead of 2 days and continue to decrease from there  9. OSA (obstructive sleep apnea) Stable continue course of treatment  -Red flags and when to present for emergency care or RTC including fever >101.5F, chest pain, shortness of breath, new/worsening/un-resolving symptoms, reviewed with patient at time of visit. Follow up and care instructions discussed and provided in AVS. -Reviewed Health Maintenance: Eye exam scheduled today

## 2018-06-05 ENCOUNTER — Other Ambulatory Visit: Payer: Self-pay

## 2018-06-05 ENCOUNTER — Ambulatory Visit: Payer: BC Managed Care – PPO | Admitting: Nurse Practitioner

## 2018-06-05 ENCOUNTER — Encounter: Payer: Self-pay | Admitting: Nurse Practitioner

## 2018-06-05 VITALS — BP 114/68 | HR 56 | Temp 97.9°F | Resp 12 | Ht 68.0 in | Wt 196.7 lb

## 2018-06-05 DIAGNOSIS — E1122 Type 2 diabetes mellitus with diabetic chronic kidney disease: Secondary | ICD-10-CM | POA: Diagnosis not present

## 2018-06-05 DIAGNOSIS — E781 Pure hyperglyceridemia: Secondary | ICD-10-CM

## 2018-06-05 DIAGNOSIS — N183 Chronic kidney disease, stage 3 unspecified: Secondary | ICD-10-CM

## 2018-06-05 DIAGNOSIS — I10 Essential (primary) hypertension: Secondary | ICD-10-CM | POA: Diagnosis not present

## 2018-06-05 DIAGNOSIS — G40909 Epilepsy, unspecified, not intractable, without status epilepticus: Secondary | ICD-10-CM | POA: Diagnosis not present

## 2018-06-05 DIAGNOSIS — J42 Unspecified chronic bronchitis: Secondary | ICD-10-CM

## 2018-06-05 DIAGNOSIS — Z794 Long term (current) use of insulin: Secondary | ICD-10-CM

## 2018-06-05 DIAGNOSIS — Z720 Tobacco use: Secondary | ICD-10-CM

## 2018-06-05 DIAGNOSIS — G4733 Obstructive sleep apnea (adult) (pediatric): Secondary | ICD-10-CM

## 2018-06-05 DIAGNOSIS — E559 Vitamin D deficiency, unspecified: Secondary | ICD-10-CM

## 2018-06-05 LAB — HM DIABETES EYE EXAM

## 2018-06-05 MED ORDER — ALBUTEROL SULFATE HFA 108 (90 BASE) MCG/ACT IN AERS: 1.0000 | INHALATION_SPRAY | Freq: Four times a day (QID) | RESPIRATORY_TRACT | 0 refills | Status: DC | PRN

## 2018-06-05 MED ORDER — IPRATROPIUM-ALBUTEROL 0.5-2.5 (3) MG/3ML IN SOLN: 3.0000 mL | Freq: Once | RESPIRATORY_TRACT | Status: DC

## 2018-06-05 MED ORDER — ATORVASTATIN CALCIUM 20 MG PO TABS: 20.0000 mg | ORAL_TABLET | Freq: Every day | ORAL | 3 refills | Status: DC

## 2018-06-07 ENCOUNTER — Telehealth: Payer: Self-pay

## 2018-06-07 NOTE — Telephone Encounter (Signed)
Call pt regarding lung screening. Pt is a current smoker, smoking about 1 pack every 2 days.PT would like scan in the morning on any day. Pt denies any new health issues at this time.

## 2018-06-11 ENCOUNTER — Telehealth: Payer: Self-pay | Admitting: *Deleted

## 2018-06-11 DIAGNOSIS — Z122 Encounter for screening for malignant neoplasm of respiratory organs: Secondary | ICD-10-CM

## 2018-06-11 NOTE — Telephone Encounter (Signed)
Patient has been notified that the annual lung cancer screening low dose CT scan is due currently or will be in the near future.  Confirmed that the patient is within the age range of 57-80, and asymptomatic, and currently exhibits no signs or symptoms of lung cancer.  Patient denies illness that would prevent curative treatment for lung cancer if found.  Verified smoking history, current smoker 0.5ppd with 41.5pkyr hx .  The shared decision making visit was completed on 03-08-17.  Patient is agreeable for the CT scan to be scheduled.  Will call patient back with date and time of appointment.

## 2018-06-12 ENCOUNTER — Telehealth: Payer: Self-pay | Admitting: *Deleted

## 2018-06-12 ENCOUNTER — Encounter: Payer: Self-pay | Admitting: *Deleted

## 2018-06-12 NOTE — Telephone Encounter (Signed)
Patient notified/message left to notify patient that due to current restrictions lung screening appointments are cancelled and patient will be contacted regarding rescheduling.  

## 2018-07-07 ENCOUNTER — Other Ambulatory Visit: Payer: Self-pay | Admitting: Family Medicine

## 2018-07-07 DIAGNOSIS — I10 Essential (primary) hypertension: Secondary | ICD-10-CM

## 2018-07-07 NOTE — Telephone Encounter (Signed)
More than a month early for beta-blocker refill Note sent to pharmacy, due in mid May

## 2018-07-25 ENCOUNTER — Other Ambulatory Visit: Payer: Self-pay | Admitting: Family Medicine

## 2018-07-30 ENCOUNTER — Telehealth: Payer: Self-pay | Admitting: *Deleted

## 2018-07-30 NOTE — Telephone Encounter (Signed)
Left message for patient to notify them that it is time to schedule annual low dose lung cancer screening CT scan. Instructed patient to call back to verify information prior to the scan being scheduled.  

## 2018-08-09 ENCOUNTER — Other Ambulatory Visit: Payer: Self-pay | Admitting: Family Medicine

## 2018-08-09 DIAGNOSIS — I10 Essential (primary) hypertension: Secondary | ICD-10-CM

## 2018-08-22 ENCOUNTER — Other Ambulatory Visit: Payer: Self-pay | Admitting: Family Medicine

## 2018-09-12 ENCOUNTER — Telehealth: Payer: Self-pay | Admitting: *Deleted

## 2018-09-12 ENCOUNTER — Telehealth: Payer: Self-pay

## 2018-09-12 DIAGNOSIS — Z122 Encounter for screening for malignant neoplasm of respiratory organs: Secondary | ICD-10-CM

## 2018-09-12 DIAGNOSIS — Z87891 Personal history of nicotine dependence: Secondary | ICD-10-CM

## 2018-09-12 NOTE — Telephone Encounter (Signed)
Call pt regarding lung screening.PT is a current smoker, smoking about 1 pack per day. Pt would like scan any Friday morning. Pt denies any new health issues.

## 2018-09-12 NOTE — Telephone Encounter (Signed)
Patient has been notified that annual lung cancer screening low dose CT scan is due currently or will be in near future. Confirmed that patient is within the age range of 55-77, and asymptomatic, (no signs or symptoms of lung cancer). Patient denies illness that would prevent curative treatment for lung cancer if found. Verified smoking history, (current, 41.75 pack year). The shared decision making visit was done 03/08/17. Patient is agreeable for CT scan being scheduled.

## 2018-09-16 ENCOUNTER — Other Ambulatory Visit: Payer: Self-pay

## 2018-09-16 DIAGNOSIS — J42 Unspecified chronic bronchitis: Secondary | ICD-10-CM

## 2018-09-16 MED ORDER — ALBUTEROL SULFATE HFA 108 (90 BASE) MCG/ACT IN AERS: 1.0000 | INHALATION_SPRAY | Freq: Four times a day (QID) | RESPIRATORY_TRACT | 0 refills | Status: DC | PRN

## 2018-09-19 ENCOUNTER — Ambulatory Visit
Admission: RE | Admit: 2018-09-19 | Discharge: 2018-09-19 | Disposition: A | Discharge status: Home / Self Care | Payer: BC Managed Care – PPO | Source: Ambulatory Visit | Attending: Nurse Practitioner | Admitting: Nurse Practitioner

## 2018-09-19 ENCOUNTER — Other Ambulatory Visit: Payer: Self-pay

## 2018-09-19 DIAGNOSIS — Z87891 Personal history of nicotine dependence: Secondary | ICD-10-CM | POA: Insufficient documentation

## 2018-09-19 DIAGNOSIS — Z122 Encounter for screening for malignant neoplasm of respiratory organs: Secondary | ICD-10-CM | POA: Diagnosis present

## 2018-09-22 ENCOUNTER — Encounter: Payer: Self-pay | Admitting: *Deleted

## 2018-09-24 ENCOUNTER — Other Ambulatory Visit: Payer: Self-pay | Admitting: Family Medicine

## 2018-10-16 ENCOUNTER — Other Ambulatory Visit: Payer: Self-pay | Admitting: Family Medicine

## 2018-10-17 MED ORDER — BEVESPI AEROSPHERE 9-4.8 MCG/ACT IN AERO: 2.0000 | INHALATION_SPRAY | Freq: Two times a day (BID) | RESPIRATORY_TRACT | 11 refills | Status: DC

## 2018-10-17 NOTE — Telephone Encounter (Signed)
Pt called to make sure Zachary Wallace got the request for his refill for Glycopyrrolate-Formoterol (BEVESPI AEROSPHERE) 9-4.8 MCG/ACT AERO  Pt next appt is not until October/ please advise

## 2018-11-08 ENCOUNTER — Other Ambulatory Visit: Payer: Self-pay | Admitting: Family Medicine

## 2018-12-08 ENCOUNTER — Encounter: Payer: BC Managed Care – PPO | Admitting: Family Medicine

## 2018-12-26 ENCOUNTER — Encounter: Payer: Self-pay | Admitting: Family Medicine

## 2018-12-26 ENCOUNTER — Ambulatory Visit (INDEPENDENT_AMBULATORY_CARE_PROVIDER_SITE_OTHER): Payer: BC Managed Care – PPO | Admitting: Family Medicine

## 2018-12-26 ENCOUNTER — Other Ambulatory Visit: Payer: Self-pay

## 2018-12-26 VITALS — BP 122/82 | HR 58 | Temp 97.1°F | Resp 16 | Ht 68.0 in | Wt 191.8 lb

## 2018-12-26 DIAGNOSIS — I10 Essential (primary) hypertension: Secondary | ICD-10-CM

## 2018-12-26 DIAGNOSIS — J42 Unspecified chronic bronchitis: Secondary | ICD-10-CM

## 2018-12-26 DIAGNOSIS — Z0000 Encounter for general adult medical examination without abnormal findings: Secondary | ICD-10-CM | POA: Diagnosis not present

## 2018-12-26 DIAGNOSIS — N529 Male erectile dysfunction, unspecified: Secondary | ICD-10-CM

## 2018-12-26 DIAGNOSIS — T7840XA Allergy, unspecified, initial encounter: Secondary | ICD-10-CM | POA: Diagnosis not present

## 2018-12-26 MED ORDER — SILDENAFIL CITRATE 100 MG PO TABS: 100.0000 mg | ORAL_TABLET | Freq: Every day | ORAL | 4 refills | Status: DC

## 2018-12-26 MED ORDER — LOSARTAN POTASSIUM-HCTZ 100-12.5 MG PO TABS: 1.0000 | ORAL_TABLET | Freq: Every day | ORAL | 3 refills | Status: DC

## 2018-12-26 MED ORDER — ALBUTEROL SULFATE HFA 108 (90 BASE) MCG/ACT IN AERS: 1.0000 | INHALATION_SPRAY | Freq: Four times a day (QID) | RESPIRATORY_TRACT | 3 refills | Status: DC | PRN

## 2018-12-26 MED ORDER — AMLODIPINE BESYLATE 10 MG PO TABS: 10.0000 mg | ORAL_TABLET | Freq: Every day | ORAL | 3 refills | Status: DC

## 2018-12-26 MED ORDER — OLOPATADINE HCL 0.2 % OP SOLN: 1.0000 [drp] | Freq: Every day | OPHTHALMIC | 3 refills | Status: DC

## 2018-12-26 NOTE — Progress Notes (Signed)
Name: Zachary Wallace   MRN: [id number]    DOB: [birth year]   Date:12/26/2018       Progress Note  Subjective  Chief Complaint  Chief Complaint  Patient presents with  . Annual Exam    HPI  Patient presents for annual CPE.  USPSTF grade A and B recommendations:  Diet: Does not eat a very healthy diet - fried foods and sweets.  Discussed diet and his DM.  Exercise: Stays very active at his job.  Depression: phq 9 is negative Depression screen [facility] 2/9 12/26/2018 03/06/2018 12/02/2017 08/14/2017 02/13/2017  Decreased Interest - 0 0 0 0  Down, Depressed, Hopeless 0 0 0 0 0  PHQ - 2 Score 0 0 0 0 0  Altered sleeping 2 0 - - -  Tired, decreased energy 0 0 - - -  Change in appetite 0 0 - - -  Feeling bad or failure about yourself  0 0 - - -  Trouble concentrating 0 0 - - -  Moving slowly or fidgety/restless 0 0 - - -  Suicidal thoughts 0 0 - - -  PHQ-9 Score 2 0 - - -  Difficult doing work/chores Not difficult at all Not difficult at all - - -    Hypertension:  BP Readings from Last 3 Encounters:  12/26/18 122/82  06/05/18 114/68  03/06/18 118/74    Obesity: Wt Readings from Last 3 Encounters:  12/26/18 191 lb 12.8 oz (87 kg)  09/19/18 193 lb (87.5 kg)  06/05/18 196 lb 11.2 oz (89.2 kg)   BMI Readings from Last 3 Encounters:  12/26/18 29.16 kg/m  09/19/18 32.12 kg/m  06/05/18 29.91 kg/m     Lipids:  Lab Results  Component Value Date   CHOL 79 12/02/2017   CHOL 91 08/14/2017   CHOL 96 02/13/2017   Lab Results  Component Value Date   HDL 29 (L) 12/02/2017   HDL 31 (L) 08/14/2017   HDL 31 (L) 02/13/2017   Lab Results  Component Value Date   LDLCALC 28 12/02/2017   LDLCALC 29 08/14/2017   LDLCALC 36 02/13/2017   Lab Results  Component Value Date   TRIG 141 12/02/2017   TRIG 246 (H) 08/14/2017   TRIG 249 (H) 02/13/2017   Lab Results  Component Value Date   CHOLHDL 2.7 12/02/2017   CHOLHDL 2.9 08/14/2017   CHOLHDL 3.1 02/13/2017   No results found  for: LDLDIRECT Glucose:  Glucose, Bld  Date Value Ref Range Status  12/02/2017 299 (H) 65 - 99 mg/dL Final    Comment:    .            Fasting reference interval . For someone without known diabetes, a glucose value >125 mg/dL indicates that they may have diabetes and this should be confirmed with a follow-up test. .   08/14/2017 214 (H) 65 - 99 mg/dL Final    Comment:    .            Fasting reference interval . For someone without known diabetes, a glucose value >125 mg/dL indicates that they may have diabetes and this should be confirmed with a follow-up test. .   02/13/2017 156 (H) 65 - 99 mg/dL Final    Comment:    .            Fasting reference interval . For someone without known diabetes, a glucose value >125 mg/dL indicates that they may have diabetes and this should be confirmed  with a follow-up test. .    Glucose-Capillary  Date Value Ref Range Status  02/04/2018 122 (H) 70 - 99 mg/dL Final      Office Visit from 12/26/2018 in [facility]  AUDIT-C Score  0    No ETOG use.  Married STD testing and prevention (HIV/chl/gon/syphilis): Declines today Hep C: Negative in 2011  Skin cancer: No concerning lesions. Does note that he has bags under his eyes - worse when his seasonal allergies flare - we will send in pataday to try to help with symptoms. Colorectal cancer: UTD; Denies family or personal history of colorectal cancer, no changes in BM's - no blood in stool, dark and tarry stool, mucus in stool, or constipation/diarrhea. Prostate cancer:  Lab Results  Component Value Date   PSA 0.8 08/14/2017   PSA 0.9 05/16/2016    IPSS Questionnaire (AUA-7): Over the past month.   1)  How often have you had a sensation of not emptying your bladder completely after you finish urinating?  2 - Less than half the time  2)  How often have you had to urinate again less than two hours after you finished urinating? 0 - Not at all  3)  How  often have you found you stopped and started again several times when you urinated?  0 - Not at all  4) How difficult have you found it to postpone urination?  0 - Not at all  5) How often have you had a weak urinary stream?  0 - Not at all  6) How often have you had to push or strain to begin urination?  0 - Not at all  7) How many times did you most typically get up to urinate from the time you went to bed until the time you got up in the morning?  3 - 3 times  Total score:  0-7 mildly symptomatic   8-19 moderately symptomatic   20-35 severely symptomatic  Mild symptoms - would like to wait on labs today  Lung cancer:  Low Dose CT Chest recommended if Age 91-80 years, 30 pack-year currently smoking OR have quit w/in 15years. Patient does qualify.  Still smoking 1/3 pack a day.  AAA: N/A - will qualify at 63yo. The USPSTF recommends one-time screening with ultrasonography in men ages 27 to 56 years who have ever smoked ECG:  On file; denies chest pain or palpitations.  Occasional ShOB - has COPD  Advanced Care Planning: A voluntary discussion about advance care planning including the explanation and discussion of advance directives.  Discussed health care proxy and Living will, and the patient was able to identify a health care proxy as Zachary Wallace.  Patient does not have a living will at present time. If patient does have living will, I have requested they bring this to the clinic to be scanned in to their chart.  Patient Active Problem List   Diagnosis Date Noted  . OSA (obstructive sleep apnea) 06/04/2018  . Tubular adenoma of colon   . Preventative health care 12/02/2017  . Snoring 02/13/2017  . Low HDL (under 40) 02/13/2017  . Screening for prostate cancer 05/16/2016  . Tobacco abuse 05/16/2016  . High triglycerides 05/16/2016  . Medication monitoring encounter 08/16/2015  . COPD (chronic obstructive pulmonary disease) ([place]) 02/10/2015  . Lumbar radiculopathy 01/10/2015  . Left hip  pain 01/10/2015  . Abnormal presence of protein in urine 10/06/2013  . Avitaminosis D 10/11/2011  . Diabetes mellitus ([place])  10/11/2011  . Chronic kidney disease (CKD), stage III (moderate) ([place]) 08/15/2010  . Convulsions, epileptic ([place]) 08/15/2010  . BP (high blood pressure) 08/15/2010  . Epilepsy ([place]) 08/15/2010    Past Surgical History:  Procedure Laterality Date  . COLONOSCOPY WITH PROPOFOL N/A 02/04/2018   Procedure: COLONOSCOPY WITH PROPOFOL;  Surgeon: Lin Landsman, MD;  Location: [facility] END[facility]COPY;  Service: Gastroenterology;  Laterality: N/A;  . HAND SURGERY Left   . KNEE SURGERY Left     Family History  Problem Relation Age of Onset  . Clotting disorder Mother   . Diabetes Mother   . Hypertension Mother   . Heart attack Father   . Hypertension Sister   . Hypertension Brother   . Vision loss Brother     Social History   Socioeconomic History  . Marital status: Married    Spouse name: Not on file  . Number of children: Not on file  . Years of education: Not on file  . Highest education level: Not on file  Occupational History  . Not on file  Social Needs  . Financial resource strain: Not on file  . Food insecurity    Worry: Not on file    Inability: Not on file  . Transportation needs    Medical: Not on file    Non-medical: Not on file  Tobacco Use  . Smoking status: Current Every Day Smoker    Packs/day: 1.00    Years: 41.00    Pack years: 41.00  . Smokeless tobacco: Never Used  Substance and Sexual Activity  . Alcohol use: No    Alcohol/week: 0.0 standard drinks  . Drug use: No  . Sexual activity: Yes    Partners: Male, Male  Lifestyle  . Physical activity    Days per week: Not on file    Minutes per session: Not on file  . Stress: Not on file  Relationships  . Social Herbalist on phone: Not on file    Gets together: Not on file    Attends religious service: Not on file    Active member of club or organization: Not on  file    Attends meetings of clubs or organizations: Not on file    Relationship status: Not on file  . Intimate partner violence    Fear of current or ex partner: Not on file    Emotionally abused: Not on file    Physically abused: Not on file    Forced sexual activity: Not on file  Other Topics Concern  . Not on file  Social History Narrative  . Not on file     Current Outpatient Medications:  .  albuterol (VENTOLIN HFA) 108 (90 Base) MCG/ACT inhaler, Inhale 1-2 puffs into the lungs every 6 (six) hours as needed for wheezing or shortness of breath., Disp: 8.5 g, Rfl: 0 .  amLODipine (NORVASC) 10 MG tablet, TAKE (1) TABLET BY MOUTH DAILY FOR HIGH BLOOD PRESSURE., Disp: 90 tablet, Rfl: 0 .  aspirin 81 MG tablet, Take 81 mg by mouth., Disp: , Rfl:  .  atorvastatin (LIPITOR) 20 MG tablet, Take 1 tablet (20 mg total) by mouth at bedtime., Disp: 90 tablet, Rfl: 3 .  BEVESPI AEROSPHERE 9-4.8 MCG/ACT AERO, INHALE 2 PUFFS INTO THE LUNGS TWICE DAILY. STOP SPIRIVA., Disp: 10.7 g, Rfl: 3 .  glucose blood (ONETOUCH VERIO) test strip, , Disp: , Rfl:  .  insulin degludec (TRESIBA FLEXTOUCH) 100 UNIT/ML SOPN FlexTouch Pen, Inject into  the skin., Disp: , Rfl:  .  losartan-hydrochlorothiazide (HYZAAR) 100-12.5 MG tablet, Take 1 tablet by mouth daily., Disp: 30 tablet, Rfl: 0 .  metoprolol tartrate (LOPRESSOR) 100 MG tablet, TAKE 1 TABLET BY MOUTH TWICE DAILY, Disp: 180 tablet, Rfl: 1 .  VIAGRA 100 MG tablet, TAKE 1 TABLET BY MOUTH ONCE DAILY., Disp: 3 tablet, Rfl: 4 .  linagliptin (TRADJENTA) 5 MG TABS tablet, Take 1 tablet (5 mg total) by mouth daily. (Patient not taking: Reported on 12/26/2018), Disp: 90 tablet, Rfl: 3 .  pioglitazone (ACTOS) 15 MG tablet, TAKE 1 TABLET BY MOUTH ONCE DAILY. (Patient not taking: Reported on 12/26/2018), Disp: 30 tablet, Rfl: 0  Current Facility-Administered Medications:  .  ipratropium-albuterol (DUONEB) 0.5-2.5 (3) MG/3ML nebulizer solution 3 mL, 3 mL, Nebulization,  Once, Poulose, Elizabeth E, NP  No Known Allergies   ROS  Constitutional: Negative for fever or weight change.  Respiratory: Positive for ocassional cough and occasional shortness of breath.   Cardiovascular: Negative for chest pain or palpitations.  Gastrointestinal: Negative for abdominal pain, no bowel changes.  Musculoskeletal: Negative for gait problem or joint swelling.  Skin: Negative for rash.  Neurological: Negative for dizziness or headache.  No other specific complaints in a complete review of systems (except as listed in HPI above).  Objective  Vitals:   12/26/18 0833  BP: 122/82  Pulse: (!) 58  Resp: 16  Temp: (!) 97.1 F (36.2 C)  SpO2: 97%  Weight: 191 lb 12.8 oz (87 kg)  Height: 5\' 8"  (1.727 m)    Body mass index is 29.16 kg/m.  Physical Exam  Constitutional: Patient appears well-developed and well-nourished. No distress.  HENT: Head: Normocephalic and atraumatic. Ears: B TMs ok, no erythema or effusion; Nose: Nose normal. Mouth/Throat: Oropharynx is clear and moist. No oropharyngeal exudate.  Eyes: Conjunctivae and EOM are normal. Pupils are equal, round, and reactive to light. No scleral icterus.  Neck: Normal range of motion. Neck supple. No JVD present. No thyromegaly present.  Cardiovascular: Normal rate, regular rhythm and normal heart sounds.  No murmur heard. No BLE edema. Pulmonary/Chest: Effort normal and breath sounds normal. No respiratory distress. Abdominal: Soft. Bowel sounds are normal, no distension. There is no tenderness. no masses MALE GENITALIA: Deferred RECTAL: Deferred Musculoskeletal: Normal range of motion, no joint effusions. No gross deformities Neurological: he is alert and oriented to person, place, and time. No cranial nerve deficit. Coordination, balance, strength, speech and gait are normal.  Skin: Skin is warm and dry. No rash noted. No erythema.  Psychiatric: Patient has a normal mood and affect. behavior is normal.  Judgment and thought content normal.  No results found for this or any previous visit (from the past 2160 hour(s)).   PHQ2/9: Depression screen [facility] 2/9 12/26/2018 03/06/2018 12/02/2017 08/14/2017 02/13/2017  Decreased Interest - 0 0 0 0  Down, Depressed, Hopeless 0 0 0 0 0  PHQ - 2 Score 0 0 0 0 0  Altered sleeping 2 0 - - -  Tired, decreased energy 0 0 - - -  Change in appetite 0 0 - - -  Feeling bad or failure about yourself  0 0 - - -  Trouble concentrating 0 0 - - -  Moving slowly or fidgety/restless 0 0 - - -  Suicidal thoughts 0 0 - - -  PHQ-9 Score 2 0 - - -  Difficult doing work/chores Not difficult at all Not difficult at all - - -    Fall Risk:  Fall Risk  12/26/2018 06/05/2018 03/06/2018 12/02/2017 08/14/2017  Falls in the past year? 0 0 0 No No  Number falls in past yr: 0 0 - - -  Injury with Fall? 0 0 - - -   Functional Status Survey: Is the patient deaf or have difficulty hearing?: No Does the patient have difficulty concentrating, remembering, or making decisions?: No Does the patient have difficulty walking or climbing stairs?: No Does the patient have difficulty dressing or bathing?: No Does the patient have difficulty doing errands alone such as visiting a doctor's office or shopping?: No  Assessment & Plan  1. Annual physical exam  -Prostate cancer screening and PSA options (with potential risks and benefits of testing vs not testing) were discussed along with recent recs/guidelines. -USPSTF grade A and B recommendations reviewed with patient; age-appropriate recommendations, preventive care, screening tests, etc discussed and encouraged; healthy living encouraged; see AVS for patient education given to patient -Discussed importance of 150 minutes of physical activity weekly, eat two servings of fish weekly, eat one serving of tree nuts ( cashews, pistachios, pecans, almonds.Marland Kitchen) every other day, eat 6 servings of fruit/vegetables daily and drink plenty of water and  avoid sweet beverages.  2. Allergic constitution, initial encounter - Olopatadine HCl 0.2 % SOLN; Apply 1 drop to eye daily.  Dispense: 2.5 mL; Refill: 3  3. Chronic bronchitis, unspecified chronic bronchitis type (HCC) - albuterol (VENTOLIN HFA) 108 (90 Base) MCG/ACT inhaler; Inhale 1-2 puffs into the lungs every 6 (six) hours as needed for wheezing or shortness of breath.  Dispense: 8.5 g; Refill: 3  4. Essential hypertension - sildenafil (VIAGRA) 100 MG tablet; Take 1 tablet (100 mg total) by mouth daily.  Dispense: 3 tablet; Refill: 4 - amLODipine (NORVASC) 10 MG tablet; Take 1 tablet (10 mg total) by mouth daily.  Dispense: 90 tablet; Refill: 3 - losartan-hydrochlorothiazide (HYZAAR) 100-12.5 MG tablet; Take 1 tablet by mouth daily.  Dispense: 90 tablet; Refill: 3  5. Erectile dysfunction, unspecified erectile dysfunction type - sildenafil (VIAGRA) 100 MG tablet; Take 1 tablet (100 mg total) by mouth daily.  Dispense: 3 tablet; Refill: 4   -Reviewed Health Maintenance:

## 2019-01-23 ENCOUNTER — Encounter: Payer: Self-pay | Admitting: Family Medicine

## 2019-01-23 ENCOUNTER — Ambulatory Visit (INDEPENDENT_AMBULATORY_CARE_PROVIDER_SITE_OTHER): Payer: BC Managed Care – PPO | Admitting: Family Medicine

## 2019-01-23 ENCOUNTER — Other Ambulatory Visit: Payer: Self-pay

## 2019-01-23 VITALS — BP 122/84 | HR 77 | Temp 97.8°F | Resp 16 | Ht 68.0 in | Wt 197.0 lb

## 2019-01-23 DIAGNOSIS — N183 Chronic kidney disease, stage 3 unspecified: Secondary | ICD-10-CM | POA: Diagnosis not present

## 2019-01-23 DIAGNOSIS — G4733 Obstructive sleep apnea (adult) (pediatric): Secondary | ICD-10-CM | POA: Diagnosis not present

## 2019-01-23 DIAGNOSIS — E559 Vitamin D deficiency, unspecified: Secondary | ICD-10-CM

## 2019-01-23 DIAGNOSIS — Z720 Tobacco use: Secondary | ICD-10-CM | POA: Diagnosis not present

## 2019-01-23 DIAGNOSIS — Z794 Long term (current) use of insulin: Secondary | ICD-10-CM

## 2019-01-23 DIAGNOSIS — G40909 Epilepsy, unspecified, not intractable, without status epilepticus: Secondary | ICD-10-CM

## 2019-01-23 DIAGNOSIS — E1122 Type 2 diabetes mellitus with diabetic chronic kidney disease: Secondary | ICD-10-CM

## 2019-01-23 DIAGNOSIS — J42 Unspecified chronic bronchitis: Secondary | ICD-10-CM

## 2019-01-23 DIAGNOSIS — E786 Lipoprotein deficiency: Secondary | ICD-10-CM

## 2019-01-23 DIAGNOSIS — I10 Essential (primary) hypertension: Secondary | ICD-10-CM

## 2019-01-23 DIAGNOSIS — E781 Pure hyperglyceridemia: Secondary | ICD-10-CM

## 2019-01-23 DIAGNOSIS — Z125 Encounter for screening for malignant neoplasm of prostate: Secondary | ICD-10-CM

## 2019-01-23 MED ORDER — TRELEGY ELLIPTA 100-62.5-25 MCG/INH IN AEPB: 1.0000 | INHALATION_SPRAY | Freq: Every day | RESPIRATORY_TRACT | 3 refills | Status: DC

## 2019-01-23 NOTE — Progress Notes (Signed)
Name: Zachary Wallace   MRN: [id number]    DOB: [birth year]   Date:01/23/2019       Progress Note  Subjective  Chief Complaint  Chief Complaint  Patient presents with  . Hypertension    3 month follow up  . Diabetes  . Hyperlipidemia    HPI  HTN/CKD -does take medications as prescribed - current regimen includes Losartan-HCTZ, metoprolol, amlodipine.  - taking medications as instructed, no medication side effects noted, no TIAs, no chest pain on exertion, no swelling of ankles, no orthostatic dizziness or lightheadedness, no orthopnea or paroxysmal nocturnal dyspnea, no palpitations - DASH diet discussed - pt does not follow a low sodium diet. - Has CKD - advised to avoid NSAIDS, drinks plenty of water; seeing Nephrology - Sherlynn Stalls NP with [facility]  OSA: Does not wear his CPAP - states it fits okay, just does not like sleeping with it.    COPD/Tobacco Abuse: Still smoking 1/3ppd.  He does get some shortness of breath with exertion still. Using Bevespi BID.  Using albuterol about twice a day.   Diabetes mellitus type 2 - Seeing Pasty Arch NP with endocrinology at [facility] Checking sugars?  yes How often? Daily Range (low to high) over last two weeks: 89-140 Does patient feel additional teaching/training would be helpful?  no  Have they attended Diabetes education classes? no  Trying to limit white bread, white rice, white potatoes, sweets?  yes Trying to limit sweetened drinks like iced tea, soft drinks, sports drinks, fruit juices?  yes Checking feet every day/night?  yes Last eye exam:  UTD in March 2020 Denies: Polyuria, polydipsia, polyphagia, vision changes, or neuropathy. Most recent A1C: 6.1 in September with [facility] Last CMP Results : is not due for repeat today - see Care Everywhere Urine Micro UTD? Yes - seeing nephrology Current Medication Management: Diabetic Medications: Tresiba 24 units ACEI/ARB: Yes Statin: Yes Aspirin therapy: Yes   HLD: Has high triglycerides and  low HDL; taking atorvastatin and tolerating well.  Denies chest pain or myalgias.    History of Epilepsy: Has had about 4 in his lifetime; last seizure was about 15 years ago.  Not on any prevention medication (has never been) and no issues at this time.  Patient Active Problem List   Diagnosis Date Noted  . OSA (obstructive sleep apnea) 06/04/2018  . Tubular adenoma of colon   . Preventative health care 12/02/2017  . Snoring 02/13/2017  . Low HDL (under 40) 02/13/2017  . Screening for prostate cancer 05/16/2016  . Tobacco abuse 05/16/2016  . High triglycerides 05/16/2016  . Medication monitoring encounter 08/16/2015  . COPD (chronic obstructive pulmonary disease) ([place]) 02/10/2015  . Lumbar radiculopathy 01/10/2015  . Left hip pain 01/10/2015  . Abnormal presence of protein in urine 10/06/2013  . Avitaminosis D 10/11/2011  . Diabetes mellitus ([place]) 10/11/2011  . Chronic kidney disease (CKD), stage III (moderate) ([place]) 08/15/2010  . Convulsions, epileptic ([place]) 08/15/2010  . BP (high blood pressure) 08/15/2010  . Epilepsy ([place]) 08/15/2010    Past Surgical History:  Procedure Laterality Date  . COLONOSCOPY WITH PROPOFOL N/A 02/04/2018   Procedure: COLONOSCOPY WITH PROPOFOL;  Surgeon: Lin Landsman, MD;  Location: [facility] ENDOSCOPY;  Service: Gastroenterology;  Laterality: N/A;  . HAND SURGERY Left   . KNEE SURGERY Left     Family History  Problem Relation Age of Onset  . Clotting disorder Mother   . Diabetes Mother   . Hypertension Mother   . Heart  attack Father   . Hypertension Sister   . Hypertension Brother   . Vision loss Brother     Social History   Socioeconomic History  . Marital status: Married    Spouse name: Not on file  . Number of children: Not on file  . Years of education: Not on file  . Highest education level: Not on file  Occupational History  . Not on file  Social Needs  . Financial resource strain: Not on file  . Food insecurity    Worry:  Not on file    Inability: Not on file  . Transportation needs    Medical: Not on file    Non-medical: Not on file  Tobacco Use  . Smoking status: Current Every Day Smoker    Packs/day: 1.00    Years: 41.00    Pack years: 41.00  . Smokeless tobacco: Never Used  Substance and Sexual Activity  . Alcohol use: No    Alcohol/week: 0.0 standard drinks  . Drug use: No  . Sexual activity: Yes    Partners: Male, Male  Lifestyle  . Physical activity    Days per week: Not on file    Minutes per session: Not on file  . Stress: Not on file  Relationships  . Social Herbalist on phone: Not on file    Gets together: Not on file    Attends religious service: Not on file    Active member of club or organization: Not on file    Attends meetings of clubs or organizations: Not on file    Relationship status: Not on file  . Intimate partner violence    Fear of current or ex partner: Not on file    Emotionally abused: Not on file    Physically abused: Not on file    Forced sexual activity: Not on file  Other Topics Concern  . Not on file  Social History Narrative  . Not on file     Current Outpatient Medications:  .  albuterol (VENTOLIN HFA) 108 (90 Base) MCG/ACT inhaler, Inhale 1-2 puffs into the lungs every 6 (six) hours as needed for wheezing or shortness of breath., Disp: 8.5 g, Rfl: 3 .  amLODipine (NORVASC) 10 MG tablet, Take 1 tablet (10 mg total) by mouth daily., Disp: 90 tablet, Rfl: 3 .  aspirin 81 MG tablet, Take 81 mg by mouth., Disp: , Rfl:  .  atorvastatin (LIPITOR) 20 MG tablet, Take 1 tablet (20 mg total) by mouth at bedtime., Disp: 90 tablet, Rfl: 3 .  B-D UF III MINI PEN NEEDLES 31G X 5 MM MISC, , Disp: , Rfl:  .  BEVESPI AEROSPHERE 9-4.8 MCG/ACT AERO, INHALE 2 PUFFS INTO THE LUNGS TWICE DAILY. STOP SPIRIVA., Disp: 10.7 g, Rfl: 3 .  glucose blood (ONETOUCH VERIO) test strip, , Disp: , Rfl:  .  insulin degludec (TRESIBA FLEXTOUCH) 100 UNIT/ML SOPN FlexTouch  Pen, Inject into the skin., Disp: , Rfl:  .  losartan-hydrochlorothiazide (HYZAAR) 100-12.5 MG tablet, Take 1 tablet by mouth daily., Disp: 90 tablet, Rfl: 3 .  metoprolol tartrate (LOPRESSOR) 100 MG tablet, TAKE 1 TABLET BY MOUTH TWICE DAILY, Disp: 180 tablet, Rfl: 1 .  Olopatadine HCl 0.2 % SOLN, Apply 1 drop to eye daily., Disp: 2.5 mL, Rfl: 3 .  sildenafil (VIAGRA) 100 MG tablet, Take 1 tablet (100 mg total) by mouth daily., Disp: 3 tablet, Rfl: 4  Current Facility-Administered Medications:  .  ipratropium-albuterol (DUONEB) 0.5-2.5 (  3) MG/3ML nebulizer solution 3 mL, 3 mL, Nebulization, Once, [place], Bethel Born, NP  No Known Allergies  I personally reviewed active problem list, medication list, allergies, notes from last encounter, lab results with the patient/caregiver today.   ROS  Constitutional: Negative for fever or weight change.  Respiratory: Negative for cough and shortness of breath.   Cardiovascular: Negative for chest pain or palpitations.  Gastrointestinal: Negative for abdominal pain, no bowel changes.  Musculoskeletal: Negative for gait problem or joint swelling.  Skin: Negative for rash.  Neurological: Negative for dizziness or headache.  No other specific complaints in a complete review of systems (except as listed in HPI above).  Objective  Vitals:   01/23/19 0730  BP: 122/84  Pulse: 77  Resp: 16  Temp: 97.8 F (36.6 C)  TempSrc: Temporal  SpO2: 96%  Weight: 197 lb (89.4 kg)  Height: 5\' 8"  (1.727 m)   Body mass index is 29.95 kg/m.  Physical Exam  Constitutional: Patient appears well-developed and well-nourished. No distress.  HENT: Head: Normocephalic and atraumatic.  Eyes: Conjunctivae and EOM are normal. No scleral icterus.  Neck: Normal range of motion. Neck supple. No JVD present.  Cardiovascular: Normal rate, regular rhythm and normal heart sounds.  No murmur heard. No BLE edema. Pulmonary/Chest: Effort normal and breath sounds normal. No  respiratory distress. Musculoskeletal: Normal range of motion, no joint effusions. No gross deformities Neurological: Pt is alert and oriented to person, place, and time. No cranial nerve deficit. Coordination, balance, strength, speech and gait are normal.  Skin: Skin is warm and dry. No rash noted. No erythema.  Psychiatric: Patient has a normal mood and affect. behavior is normal. Judgment and thought content normal.  No results found for this or any previous visit (from the past 72 hour(s)).  PHQ2/9: Depression screen [facility] 2/9 01/23/2019 12/26/2018 03/06/2018 12/02/2017 08/14/2017  Decreased Interest 0 - 0 0 0  Down, Depressed, Hopeless 0 0 0 0 0  PHQ - 2 Score 0 0 0 0 0  Altered sleeping 0 2 0 - -  Tired, decreased energy 0 0 0 - -  Change in appetite 0 0 0 - -  Feeling bad or failure about yourself  0 0 0 - -  Trouble concentrating 0 0 0 - -  Moving slowly or fidgety/restless 0 0 0 - -  Suicidal thoughts 0 0 0 - -  PHQ-9 Score 0 2 0 - -  Difficult doing work/chores Not difficult at all Not difficult at all Not difficult at all - -   PHQ-2/9 Result is negative.    Fall Risk: Fall Risk  01/23/2019 12/26/2018 06/05/2018 03/06/2018 12/02/2017  Falls in the past year? 0 0 0 0 No  Number falls in past yr: 0 0 0 - -  Injury with Fall? 0 0 0 - -  Follow up Falls evaluation completed - - - -    Assessment & Plan  1. Stage 3 chronic kidney disease, unspecified whether stage 3a or 3b CKD - Seeing nephrology; BP is well controlled, DM is well controlled.  2. Essential hypertension - Continue current regimen  3. OSA (obstructive sleep apnea) - Does not want to wear CPAP machine at this time.  4. Tobacco abuse - Not ready for cessation; spent 3 minutes discussing - Fluticasone-Umeclidin-Vilant (TRELEGY ELLIPTA) 100-62.5-25 MCG/INH AEPB; Inhale 1 puff into the lungs daily. Replaces your Bevespi  Dispense: 1 each; Refill: 3  5. Chronic bronchitis, unspecified chronic bronchitis type  ([place]) -  Fluticasone-Umeclidin-Vilant (TRELEGY ELLIPTA) 100-62.5-25 MCG/INH AEPB; Inhale 1 puff into the lungs daily. Replaces your Bevespi  Dispense: 1 each; Refill: 3  6. High triglycerides - Lipid panel - Continue statin therapy  7. Low HDL (under 40) - Lipid panel  8. Type 2 diabetes mellitus with stage 3 chronic kidney disease, with long-term current use of insulin, unspecified whether stage 3a or 3b CKD ([place]) - Seeing endocrinology and doing very well - congratulated on recent A1C of 6.1%.  9. Avitaminosis D - VITAMIN D 25 Hydroxy (Vit-D Deficiency, Fractures)  10. Prostate cancer screening - PSA  11. Nonintractable epilepsy without status epilepticus, unspecified epilepsy type ([place]) - Stable, no issues in 15 years.

## 2019-01-24 LAB — LIPID PANEL
Cholesterol: 95 mg/dL (ref <200)
HDL: 35 mg/dL — ABNORMAL LOW (ref >=40)
LDL Cholesterol (Calc): 37 mg/dL (calc)
Non-HDL Cholesterol (Calc): 60 mg/dL (calc) (ref <130)
Total CHOL/HDL Ratio: 2.7 (calc) (ref <5.0)
Triglycerides: 147 mg/dL (ref <150)

## 2019-01-24 LAB — PSA: PSA: 1.2 ng/mL (ref <=4.0)

## 2019-01-24 LAB — VITAMIN D 25 HYDROXY (VIT D DEFICIENCY, FRACTURES): Vit D, 25-Hydroxy: 20 ng/mL — ABNORMAL LOW (ref 30–100)

## 2019-02-23 ENCOUNTER — Ambulatory Visit (INDEPENDENT_AMBULATORY_CARE_PROVIDER_SITE_OTHER): Payer: BC Managed Care – PPO | Admitting: Family Medicine

## 2019-02-23 ENCOUNTER — Ambulatory Visit
Admission: RE | Admit: 2019-02-23 | Discharge: 2019-02-23 | Disposition: A | Discharge status: Home / Self Care | Payer: BC Managed Care – PPO | Source: Ambulatory Visit | Attending: Family Medicine | Admitting: Family Medicine

## 2019-02-23 ENCOUNTER — Encounter: Payer: Self-pay | Admitting: Family Medicine

## 2019-02-23 ENCOUNTER — Other Ambulatory Visit: Payer: Self-pay

## 2019-02-23 VITALS — BP 128/76 | HR 100 | Temp 98.1°F | Resp 18 | Ht 67.0 in | Wt 199.4 lb

## 2019-02-23 DIAGNOSIS — J42 Unspecified chronic bronchitis: Secondary | ICD-10-CM | POA: Insufficient documentation

## 2019-02-23 DIAGNOSIS — M5441 Lumbago with sciatica, right side: Secondary | ICD-10-CM | POA: Diagnosis not present

## 2019-02-23 DIAGNOSIS — I10 Essential (primary) hypertension: Secondary | ICD-10-CM | POA: Diagnosis not present

## 2019-02-23 MED ORDER — GUAIFENESIN ER 600 MG PO TB12: 600.0000 mg | ORAL_TABLET | Freq: Two times a day (BID) | ORAL | 0 refills | Status: DC | PRN

## 2019-02-23 MED ORDER — TIZANIDINE HCL 4 MG PO TABS: 2.0000 mg | ORAL_TABLET | Freq: Three times a day (TID) | ORAL | 0 refills | Status: DC | PRN

## 2019-02-23 MED ORDER — METOPROLOL TARTRATE 100 MG PO TABS: 100.0000 mg | ORAL_TABLET | Freq: Two times a day (BID) | ORAL | 2 refills | Status: DC

## 2019-02-23 NOTE — Patient Instructions (Signed)
Sciatica Rehab Ask your health care provider which exercises are safe for you. Do exercises exactly as told by your health care provider and adjust them as directed. It is normal to feel mild stretching, pulling, tightness, or discomfort as you do these exercises. Stop right away if you feel sudden pain or your pain gets worse. Do not begin these exercises until told by your health care provider. Stretching and range-of-motion exercises These exercises warm up your muscles and joints and improve the movement and flexibility of your hips and back. These exercises also help to relieve pain, numbness, and tingling. Sciatic nerve glide 1. Sit in a chair with your head facing down toward your chest. Place your hands behind your back. Let your shoulders slump forward. 2. Slowly straighten one of your legs while you tilt your head back as if you are looking toward the ceiling. Only straighten your leg as far as you can without making your symptoms worse. 3. Hold this position for __________ seconds. 4. Slowly return to the starting position. 5. Repeat with your other leg. Repeat __________ times. Complete this exercise __________ times a day. Knee to chest with hip adduction and internal rotation  1. Lie on your back on a firm surface with both legs straight. 2. Bend one of your knees and move it up toward your chest until you feel a gentle stretch in your lower back and buttock. Then, move your knee toward the shoulder that is on the opposite side from your leg. This is hip adduction and internal rotation. ? Hold your leg in this position by holding on to the front of your knee. 3. Hold this position for __________ seconds. 4. Slowly return to the starting position. 5. Repeat with your other leg. Repeat __________ times. Complete this exercise __________ times a day. Prone extension on elbows  1. Lie on your abdomen on a firm surface. A bed may be too soft for this exercise. 2. Prop yourself up on  your elbows. 3. Use your arms to help lift your chest up until you feel a gentle stretch in your abdomen and your lower back. ? This will place some of your body weight on your elbows. If this is uncomfortable, try stacking pillows under your chest. ? Your hips should stay down, against the surface that you are lying on. Keep your hip and back muscles relaxed. 4. Hold this position for __________ seconds. 5. Slowly relax your upper body and return to the starting position. Repeat __________ times. Complete this exercise __________ times a day. Strengthening exercises These exercises build strength and endurance in your back. Endurance is the ability to use your muscles for a long time, even after they get tired. Pelvic tilt This exercise strengthens the muscles that lie deep in the abdomen. 1. Lie on your back on a firm surface. Bend your knees and keep your feet flat on the floor. 2. Tense your abdominal muscles. Tip your pelvis up toward the ceiling and flatten your lower back into the floor. ? To help with this exercise, you may place a small towel under your lower back and try to push your back into the towel. 3. Hold this position for __________ seconds. 4. Let your muscles relax completely before you repeat this exercise. Repeat __________ times. Complete this exercise __________ times a day. Alternating arm and leg raises  1. Get on your hands and knees on a firm surface. If you are on a hard floor, you may want to use   padding, such as an exercise mat, to cushion your knees. 2. Line up your arms and legs. Your hands should be directly below your shoulders, and your knees should be directly below your hips. 3. Lift your left leg behind you. At the same time, raise your right arm and straighten it in front of you. ? Do not lift your leg higher than your hip. ? Do not lift your arm higher than your shoulder. ? Keep your abdominal and back muscles tight. ? Keep your hips facing the  ground. ? Do not arch your back. ? Keep your balance carefully, and do not hold your breath. 4. Hold this position for __________ seconds. 5. Slowly return to the starting position. 6. Repeat with your right leg and your left arm. Repeat __________ times. Complete this exercise __________ times a day. Posture and body mechanics Good posture and healthy body mechanics can help to relieve stress in your body's tissues and joints. Body mechanics refers to the movements and positions of your body while you do your daily activities. Posture is part of body mechanics. Good posture means:  Your spine is in its natural S-curve position (neutral).  Your shoulders are pulled back slightly.  Your head is not tipped forward. Follow these guidelines to improve your posture and body mechanics in your everyday activities. Standing   When standing, keep your spine neutral and your feet about hip width apart. Keep a slight bend in your knees. Your ears, shoulders, and hips should line up.  When you do a task in which you stand in one place for a long time, place one foot up on a stable object that is 2-4 inches (5-10 cm) high, such as a footstool. This helps keep your spine neutral. Sitting   When sitting, keep your spine neutral and keep your feet flat on the floor. Use a footrest, if necessary, and keep your thighs parallel to the floor. Avoid rounding your shoulders, and avoid tilting your head forward.  When working at a desk or a computer, keep your desk at a height where your hands are slightly lower than your elbows. Slide your chair under your desk so you are close enough to maintain good posture.  When working at a computer, place your monitor at a height where you are looking straight ahead and you do not have to tilt your head forward or downward to look at the screen. Resting  When lying down and resting, avoid positions that are most painful for you.  If you have pain with activities  such as sitting, bending, stooping, or squatting, lie in a position in which your body does not bend very much. For example, avoid curling up on your side with your arms and knees near your chest (fetal position).  If you have pain with activities such as standing for a long time or reaching with your arms, lie with your spine in a neutral position and bend your knees slightly. Try the following positions: ? Lying on your side with a pillow between your knees. ? Lying on your back with a pillow under your knees. Lifting   When lifting objects, keep your feet at least shoulder width apart and tighten your abdominal muscles.  Bend your knees and hips and keep your spine neutral. It is important to lift using the strength of your legs, not your back. Do not lock your knees straight out.  Always ask for help to lift heavy or awkward objects. This information is not   intended to replace advice given to you by your health care provider. Make sure you discuss any questions you have with your health care provider. Document Released: 03/12/2005 Document Revised: 07/04/2018 Document Reviewed: 04/03/2018 Elsevier Patient Education  2020 Elsevier Inc.  

## 2019-02-23 NOTE — Progress Notes (Signed)
Name: Zachary Wallace   MRN: [id number]    DOB: [birth year]   Date:02/23/2019       Progress Note  Subjective  Chief Complaint  Chief Complaint  Patient presents with   Back Pain    that radiates down buttock for 1 week    HPI  PT presents with concern for back pain - bilateral low back pain with radiation into the right buttock for about a week.  He also reports some lower ribcage pain when coughing - reports history of PNA and pleurisy in the past and is concerned about this as well.  He has COPD and was recently switched to Trelegy which he reports has helped significantly with shortness of breath.  Uses albuterol PRN for cough, but feels like he has phlegm that he has trouble getting up.  Denies issues with his gait, is doing okay with completing tasks at work, denies numbness/tingling/weakness in legs, chest pain.  Patient Active Problem List   Diagnosis Date Noted   OSA (obstructive sleep apnea) 06/04/2018   Tubular adenoma of colon    Low HDL (under 40) 02/13/2017   Tobacco abuse 05/16/2016   High triglycerides 05/16/2016   COPD (chronic obstructive pulmonary disease) ([place]) 02/10/2015   Lumbar radiculopathy 01/10/2015   Left hip pain 01/10/2015   Abnormal presence of protein in urine 10/06/2013   Avitaminosis D 10/11/2011   Diabetes mellitus ([place]) 10/11/2011   Chronic kidney disease (CKD), stage III (moderate) ([place]) 08/15/2010   Convulsions, epileptic ([place]) 08/15/2010   BP (high blood pressure) 08/15/2010   Epilepsy ([place]) 08/15/2010    Social History   Tobacco Use   Smoking status: Current Every Day Smoker    Packs/day: 1.00    Years: 41.00    Pack years: 41.00   Smokeless tobacco: Never Used  Substance Use Topics   Alcohol use: No    Alcohol/week: 0.0 standard drinks     Current Outpatient Medications:    albuterol (VENTOLIN HFA) 108 (90 Base) MCG/ACT inhaler, Inhale 1-2 puffs into the lungs every 6 (six) hours as needed for wheezing or  shortness of breath., Disp: 8.5 g, Rfl: 3   amLODipine (NORVASC) 10 MG tablet, Take 1 tablet (10 mg total) by mouth daily., Disp: 90 tablet, Rfl: 3   aspirin 81 MG tablet, Take 81 mg by mouth., Disp: , Rfl:    atorvastatin (LIPITOR) 20 MG tablet, Take 1 tablet (20 mg total) by mouth at bedtime., Disp: 90 tablet, Rfl: 3   B-D UF III MINI PEN NEEDLES 31G X 5 MM MISC, , Disp: , Rfl:    Fluticasone-Umeclidin-Vilant (TRELEGY ELLIPTA) 100-62.5-25 MCG/INH AEPB, Inhale 1 puff into the lungs daily. Replaces your Bevespi, Disp: 1 each, Rfl: 3   glucose blood (ONETOUCH VERIO) test strip, , Disp: , Rfl:    insulin degludec (TRESIBA FLEXTOUCH) 100 UNIT/ML SOPN FlexTouch Pen, Inject into the skin., Disp: , Rfl:    losartan-hydrochlorothiazide (HYZAAR) 100-12.5 MG tablet, Take 1 tablet by mouth daily., Disp: 90 tablet, Rfl: 3   metoprolol tartrate (LOPRESSOR) 100 MG tablet, TAKE 1 TABLET BY MOUTH TWICE DAILY, Disp: 180 tablet, Rfl: 1   Olopatadine HCl 0.2 % SOLN, Apply 1 drop to eye daily., Disp: 2.5 mL, Rfl: 3   sildenafil (VIAGRA) 100 MG tablet, Take 1 tablet (100 mg total) by mouth daily., Disp: 3 tablet, Rfl: 4  No Known Allergies  I personally reviewed active problem list, medication list, allergies, notes from last encounter, lab results with the  patient/caregiver today.  ROS  Ten systems reviewed and is negative except as mentioned in HPI  Objective  Vitals:   02/23/19 0750  BP: 128/76  Pulse: 100  Resp: 18  Temp: 98.1 F (36.7 C)  TempSrc: Temporal  SpO2: 96%  Weight: 199 lb 6.4 oz (90.4 kg)  Height: 5\' 7"  (1.702 m)    Body mass index is 31.23 kg/m.  Nursing Note and Vital Signs reviewed.  Physical Exam  Constitutional: Patient appears well-developed and well-nourished. No distress.  HENT: Head: Normocephalic and atraumatic. Eyes: Conjunctivae and EOM are normal. No scleral icterus.  Neck: Normal range of motion. Neck supple. No JVD present.  Cardiovascular: Normal  rate, regular rhythm and normal heart sounds.  No murmur heard. No BLE edema. Pulmonary/Chest: Effort normal and breath sounds normal. No respiratory distress. Musculoskeletal: Normal range of motion, no joint effusions. No gross deformities.  There is no point tenderness, step-offs, or spinal tenderness. Neurological: Pt is alert and oriented to person, place, and time. No cranial nerve deficit. Coordination, balance, strength, speech and gait are normal. Skin: Skin is warm and dry. No rash noted. No erythema.  Psychiatric: Patient has a normal mood and affect. behavior is normal. Judgment and thought content normal.  No results found for this or any previous visit (from the past 72 hour(s)).  Assessment & Plan  1. Chronic bronchitis, unspecified chronic bronchitis type (HCC) - Hx PNA - will check CXR; doing much better with his breathing on Trelegy. - guaiFENesin (MUCINEX) 600 MG 12 hr tablet; Take 1 tablet (600 mg total) by mouth 2 (two) times daily as needed for cough or to loosen phlegm.  Dispense: 30 tablet; Refill: 0 - DG Chest 2 View; Future  2. Acute bilateral low back pain with right-sided sciatica - tiZANidine (ZANAFLEX) 4 MG tablet; Take 0.5-1 tablets (2-4 mg total) by mouth every 8 (eight) hours as needed (low back pain or pleurisy pain). *May cause drowsiness*  Dispense: 30 tablet; Refill: 0  3. Essential hypertension - metoprolol tartrate (LOPRESSOR) 100 MG tablet; Take 1 tablet (100 mg total) by mouth 2 (two) times daily.  Dispense: 180 tablet; Refill: 2  -Red flags and when to present for emergency care or RTC including fever >101.10F, chest pain, shortness of breath, new/worsening/un-resolving symptoms, reviewed with patient at time of visit. Follow up and care instructions discussed and provided in AVS.

## 2019-03-26 ENCOUNTER — Telehealth: Payer: Self-pay | Admitting: Family Medicine

## 2019-03-26 DIAGNOSIS — J42 Unspecified chronic bronchitis: Secondary | ICD-10-CM

## 2019-03-26 NOTE — Telephone Encounter (Signed)
Was having back pain since starting Trelegy. He stopped the medication and the pain went away. If you want to give him anything else to try please advise. Next wek is ok

## 2019-03-26 NOTE — Telephone Encounter (Addendum)
Pt is calling he stopped taking trelegy due to back pain yesterday. Please advise. [facility] does not have any openings today. Please call pt at home number first then cell

## 2019-03-29 NOTE — Telephone Encounter (Signed)
When we spoke last, he felt the trelegy was helping his shortness of breath significantly.  Would he like to switch back to bevespi alone or would he like me to choose a different inhaler medication combination?

## 2019-03-30 NOTE — Telephone Encounter (Signed)
Called left message for patient to call office 

## 2019-03-31 NOTE — Telephone Encounter (Addendum)
Pt called early am.  Pt relayed Raquel Sarna message and he states he prefers you to chose a DIFFERENT combination, as the trelegy makes him hurt so bad.  Pt also states he needs ASAP due to breathing issues.  Or he will need to come see you for appt.  Pease cb on cell phone if you need to speak with him. [phone]

## 2019-03-31 NOTE — Telephone Encounter (Signed)
Please advise 

## 2019-04-01 MED ORDER — TIOTROPIUM BROMIDE-OLODATEROL 2.5-2.5 MCG/ACT IN AERS: 2.0000 | INHALATION_SPRAY | Freq: Every day | RESPIRATORY_TRACT | 3 refills | Status: DC

## 2019-04-01 MED ORDER — FLUTICASONE PROPIONATE HFA 110 MCG/ACT IN AERO: 2.0000 | INHALATION_SPRAY | Freq: Two times a day (BID) | RESPIRATORY_TRACT | 12 refills | Status: DC

## 2019-04-01 NOTE — Telephone Encounter (Signed)
New inhaler regimen is sent in.

## 2019-04-01 NOTE — Addendum Note (Signed)
Addended by: Hubbard Hartshorn on: 04/01/2019 02:38 PM   Modules accepted: Orders

## 2019-04-20 NOTE — Telephone Encounter (Signed)
Pt is returning helen call from last week. Pt had a message on his voice mail. Pt is not sure what the call was regarding. Pt has pick up his inhaler

## 2019-05-08 ENCOUNTER — Other Ambulatory Visit: Payer: Self-pay | Admitting: Family Medicine

## 2019-05-08 DIAGNOSIS — J42 Unspecified chronic bronchitis: Secondary | ICD-10-CM

## 2019-05-08 NOTE — Telephone Encounter (Signed)
Medication: albuterol (VENTOLIN HFA) 108 (90 Base) MCG/ACT inhaler [id number]   Has the patient contacted their pharmacy. Yes  (Agent: If no, request that the patient contact the pharmacy for the refill.) (Agent: If yes, when and what did the pharmacy advise?)  Preferred Pharmacy (with phone number or street [place], [place]  Phone:  [phone] Fax:  [phone]     Agent: Please be advised that RX refills may take up to 3 business days. We ask that you follow-up with your pharmacy.

## 2019-05-18 ENCOUNTER — Other Ambulatory Visit: Payer: Self-pay | Admitting: Family Medicine

## 2019-05-18 DIAGNOSIS — J42 Unspecified chronic bronchitis: Secondary | ICD-10-CM

## 2019-05-18 DIAGNOSIS — Z720 Tobacco use: Secondary | ICD-10-CM

## 2019-05-25 MED ORDER — ALBUTEROL SULFATE HFA 108 (90 BASE) MCG/ACT IN AERS: 1.0000 | INHALATION_SPRAY | Freq: Four times a day (QID) | RESPIRATORY_TRACT | 3 refills | Status: DC | PRN

## 2019-05-25 NOTE — Telephone Encounter (Signed)
Pt is calling checking on the status of albuterol inhaler

## 2019-05-27 ENCOUNTER — Other Ambulatory Visit: Payer: Self-pay | Admitting: Family Medicine

## 2019-05-27 DIAGNOSIS — N529 Male erectile dysfunction, unspecified: Secondary | ICD-10-CM

## 2019-05-27 DIAGNOSIS — I10 Essential (primary) hypertension: Secondary | ICD-10-CM

## 2019-06-11 LAB — HM DIABETES EYE EXAM

## 2019-06-15 ENCOUNTER — Encounter: Payer: Self-pay | Admitting: Family Medicine

## 2019-06-30 LAB — HM DIABETES FOOT EXAM: HM Diabetic Foot Exam: NORMAL

## 2019-07-22 ENCOUNTER — Other Ambulatory Visit: Payer: Self-pay | Admitting: Family Medicine

## 2019-07-22 DIAGNOSIS — J42 Unspecified chronic bronchitis: Secondary | ICD-10-CM

## 2019-07-22 NOTE — Telephone Encounter (Signed)
Requested medication (s) are due for refill today - yes  Requested medication (s) are on the active medication list -yes  Future visit scheduled -yes  Last refill: 06/25/19  Notes to clinic: Request of medication not assigned to protocol.  Requested Prescriptions  Pending Prescriptions Disp Refills   STIOLTO RESPIMAT 2.5-2.5 MCG/ACT AERS [Pharmacy Med Name: STIOLTO RESPIMAT INHAL SPRAY] 4 g 0    Sig: INHALE 2 PUFFS BY MOUTH ONCE DAILY      Off-Protocol Failed - 07/22/2019  4:44 PM      Failed - Medication not assigned to a protocol, review manually.      Passed - Valid encounter within last 12 months    Recent Outpatient Visits           4 months ago Chronic bronchitis, unspecified chronic bronchitis type [facility])   [place], FNP   6 months ago Stage 3 chronic kidney disease, unspecified whether stage 3a or 3b CKD   [place], FNP   6 months ago Annual physical exam   [place], [place], [place]   1 year ago Essential hypertension   [place], NP   1 year ago Chronic bronchitis, unspecified chronic bronchitis type [facility])   [place], Satira Anis, MD       Future Appointments             In 1 month Hubbard Hartshorn, [facility], [place] 2.5-2.5 MCG/ACT AERS [Pharmacy Med Name: STIOLTO RESPIMAT INHAL SPRAY] 4 g 0    Sig: INHALE 2 PUFFS BY MOUTH ONCE DAILY      Off-Protocol Failed - 07/22/2019  4:44 PM      Failed - Medication not assigned to a protocol, review manually.      Passed - Valid encounter within last 12 months    Recent Outpatient Visits           4 months ago Chronic bronchitis, unspecified chronic bronchitis type [facility])   [place], FNP   6  months ago Stage 3 chronic kidney disease, unspecified whether stage 3a or 3b CKD   [place], FNP   6 months ago Annual physical exam   [place], [place], [place]   1 year ago Essential hypertension   [place], NP   1 year ago Chronic bronchitis, unspecified chronic bronchitis type [facility])   [place], Satira Anis, MD       Future Appointments             In 1 month Uvaldo Rising, Astrid Divine, [facility], [facility]

## 2019-07-27 NOTE — Telephone Encounter (Signed)
Pt calling and is requesting to have this medication sent in. Please advise.

## 2019-07-30 NOTE — Telephone Encounter (Signed)
Pt following up on refill for this inhaler.  Tiotropium Bromide-Olodaterol 2.5-2.5 MCG/ACT AERS  It has been a week and pt would like Rx sent in please.  [place], [place] Phone:  [phone]  Fax:  [phone]

## 2019-07-31 ENCOUNTER — Other Ambulatory Visit: Payer: Self-pay | Admitting: Internal Medicine

## 2019-07-31 DIAGNOSIS — J42 Unspecified chronic bronchitis: Secondary | ICD-10-CM

## 2019-08-25 ENCOUNTER — Ambulatory Visit: Payer: BC Managed Care – PPO | Admitting: Family Medicine

## 2019-08-25 NOTE — Progress Notes (Signed)
Patient ID: Zachary Wallace, male    DOB: September 12, 1955, 64 y.o.   MRN: [id number]  PCP: Towanda Malkin, MD  No chief complaint on file.   Subjective:   Zachary Wallace is a 64 y.o. male, presents to clinic with CC of the following:  No chief complaint on file.   HPI:  Patient is a 64 y.o. male patient of Raelyn Ensign Last visit with Raquel Sarna was 01/23/20 F/U's today -  Last saw endo 06/30/19 Last nephrology visit - 07/29/19 All in all, he notes he is feeling "pretty good".  Noted battling a cold recently  HTN Med regimen -  current regimen includes Losartan-HCTZ, metoprolol, amlodipine.  taking medications as instructed Does not check BP's at home no chest pain on exertion, no swelling of ankles, no palpitations, no increased HA's or vision changes BP Readings from Last 3 Encounters:  08/26/19 108/74  02/23/19 128/76  01/23/19 122/84   per last nephrology visit in May: In the future, showed further antihypertensive control be needed, I recommend reinitiating his Aldactone. If he becomes hypotensive, I would prefer to leave his HCTZ, and losartan doses alone and reduce the amlodipine. His ideal BP goal is <130/80.    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 On hemodialysis for approximately 4 years, off for past >5 years. Chronic kidney disease is stable. Patient is seen every 6 months by [facility],  And per last assessment in May, 2021: Chronic Kidney Disease Stage III - his renal disease does appear to be stable with a baseline creatinine ranging 1.7-2.3 over the past 7 years. At this time, no change in his medication is indicated and there is no acute indication for dialysis. We reminded him to avoid nephrotoxic agents.  advised to avoid NSAIDS, drinks plenty of water;  Cr - 1.6 in Sept 2020, 1.9 in Feb 2020   OSA: Does not wear his CPAP - states it fits okay, just does not like  sleeping with it.    COPD/Tobacco Abuse: Still smoking 1/2 ppd.  He does get some shortness of breath with exertion still, and at times, felt like the shortness of breath was a little more problematic, and noted that is when he did not have his medications and also notes wearing the mask with Covid has been a little more challenging for him.  He noted when he takes his medications as directed, it has been good. Med regimen - Using Flovent inhaler twice daily, Stiolto inhaler once daily, and albuterol about 2-3 times a day when needed. At April visit with endo - Reported that he decreased from smoking 1 pack of cigarettes daily to1 pack every 3 days.  Today he noted a pack lasting 2 days.   Strongly encouraged working to complete tobacco cessation and the importance of that  Diabetes mellitus type 2 - Seeing endocrinology at [facility], last visit 06/30/19 Checking sugars - daily, with range (low to high) over last two weeks: 89-140 Diet - Trying to limit sweetened drinks and watch his diet better  Most recent A1C: 10.4 at last visit 06/30/19 (8.6 three months prior)  Current Medication Management with recent changes rec'ed by endo: - DiscontinueTradjenta 5 mg daily - Replace Tradjenta with Trulicity [id number] mg WEEKLY - Continue Actos 15 mg daily, although he noted today he is not taking this medicine. -ContinueTresiba 28 units each morning.  ACEI/ARB: Yes Statin: Yes  Denies: polydipsia,  vision changes, numbness or tingling in his extremities, and does note he is up 2-3 times a night to urinate depending on how much he drinks in the evening.  HLD: Has history of high triglycerides and low HDL;  taking atorvastatin and tolerating well. Lab Results  Component Value Date   CHOL 95 01/23/2019   HDL 35 (L) 01/23/2019   LDLCALC 37 01/23/2019   TRIG 147 01/23/2019   CHOLHDL 2.7 01/23/2019     Denies  myalgias.    Vitamin D Deficiency - Lab Results  Component Value Date   VD25OH 20 (L) 01/23/2019     Per last nephrology note: I recommend reinitiation of over-the-counter vitamin D of 2000 international units once daily.  He has not resumed to taking any vitamin D over-the-counter supplements.  I did encourage him to do so today as well.  History of Epilepsy: Has had about 4 in his lifetime; last seizure was about 15 years ago.  Not on any prevention medication (has never been) and no issues at this time.  Labs in [place] system reviewed.  Patient Active Problem List   Diagnosis Date Noted  . OSA (obstructive sleep apnea) 06/04/2018  . Tubular adenoma of colon   . Low HDL (under 40) 02/13/2017  . Tobacco abuse 05/16/2016  . High triglycerides 05/16/2016  . COPD (chronic obstructive pulmonary disease) ([place]) 02/10/2015  . Lumbar radiculopathy 01/10/2015  . Left hip pain 01/10/2015  . Abnormal presence of protein in urine 10/06/2013  . Avitaminosis D 10/11/2011  . Diabetes mellitus ([place]) 10/11/2011  . Chronic kidney disease (CKD), stage III (moderate) ([place]) 08/15/2010  . Convulsions, epileptic ([place]) 08/15/2010  . BP (high blood pressure) 08/15/2010  . Epilepsy ([place]) 08/15/2010      Current Outpatient Medications:  .  albuterol (VENTOLIN HFA) 108 (90 Base) MCG/ACT inhaler, INHALE 1-2 PUFFS BY MOUTH EVERY 6 HOURS AS NEEDED FOR WHEEZING OR SHORTNESS OF BREATH, Disp: 18 g, Rfl: 5 .  amLODipine (NORVASC) 10 MG tablet, Take 1 tablet (10 mg total) by mouth daily., Disp: 90 tablet, Rfl: 3 .  aspirin 81 MG tablet, Take 81 mg by mouth., Disp: , Rfl:  .  atorvastatin (LIPITOR) 20 MG tablet, Take 1 tablet (20 mg total) by mouth at bedtime., Disp: 90 tablet, Rfl: 3 .  B-D UF III MINI PEN NEEDLES 31G X 5 MM MISC, , Disp: , Rfl:  .  fluticasone (FLOVENT HFA) 110 MCG/ACT inhaler, Inhale 2 puffs into the lungs 2 (two) times daily., Disp: 1 Inhaler, Rfl: 12 .  glucose blood (ONETOUCH VERIO) test strip, , Disp: , Rfl:  .  guaiFENesin (MUCINEX) 600 MG 12 hr tablet, Take 1 tablet (600 mg total) by  mouth 2 (two) times daily as needed for cough or to loosen phlegm., Disp: 30 tablet, Rfl: 0 .  insulin degludec (TRESIBA FLEXTOUCH) 100 UNIT/ML SOPN FlexTouch Pen, Inject into the skin., Disp: , Rfl:  .  losartan-hydrochlorothiazide (HYZAAR) 100-12.5 MG tablet, Take 1 tablet by mouth daily., Disp: 90 tablet, Rfl: 3 .  metoprolol tartrate (LOPRESSOR) 100 MG tablet, Take 1 tablet (100 mg total) by mouth 2 (two) times daily., Disp: 180 tablet, Rfl: 2 .  Olopatadine HCl 0.2 % SOLN, Apply 1 drop to eye daily., Disp: 2.5 mL, Rfl: 3 .  STIOLTO RESPIMAT 2.5-2.5 MCG/ACT AERS, INHALE 2 PUFFS BY MOUTH ONCE DAILY, Disp: 4 g, Rfl: 0 .  tiZANidine (ZANAFLEX) 4 MG tablet, Take 0.5-1 tablets (2-4 mg total) by mouth every 8 (eight)  hours as needed (low back pain or pleurisy pain). *May cause drowsiness*, Disp: 30 tablet, Rfl: 0 .  VIAGRA 100 MG tablet, TAKE 1 TABLET BY MOUTH ONCE DAILY., Disp: 3 tablet, Rfl: 0   No Known Allergies   Past Surgical History:  Procedure Laterality Date  . COLONOSCOPY WITH PROPOFOL N/A 02/04/2018   Procedure: COLONOSCOPY WITH PROPOFOL;  Surgeon: Lin Landsman, MD;  Location: [facility] ENDOSCOPY;  Service: Gastroenterology;  Laterality: N/A;  . HAND SURGERY Left   . KNEE SURGERY Left      Family History  Problem Relation Age of Onset  . Clotting disorder Mother   . Diabetes Mother   . Hypertension Mother   . Heart attack Father   . Hypertension Sister   . Hypertension Brother   . Vision loss Brother      Social History   Tobacco Use  . Smoking status: Current Every Day Smoker    Packs/day: 1.00    Years: 41.00    Pack years: 41.00  . Smokeless tobacco: Never Used  Substance Use Topics  . Alcohol use: No    Alcohol/week: 0.0 standard drinks    With staff assistance, above reviewed with the patient today.  ROS: As per HPI, otherwise no specific complaints on a limited and focused system review   No results found for this or any previous visit (from the  past 72 hour(s)).   PHQ2/9: Depression screen [facility] 2/9 02/23/2019 01/23/2019 12/26/2018 03/06/2018 12/02/2017  Decreased Interest 0 0 - 0 0  Down, Depressed, Hopeless 0 0 0 0 0  PHQ - 2 Score 0 0 0 0 0  Altered sleeping 0 0 2 0 -  Tired, decreased energy 0 0 0 0 -  Change in appetite 0 0 0 0 -  Feeling bad or failure about yourself  0 0 0 0 -  Trouble concentrating 0 0 0 0 -  Moving slowly or fidgety/restless 0 0 0 0 -  Suicidal thoughts 0 0 0 0 -  PHQ-9 Score 0 0 2 0 -  Difficult doing work/chores Not difficult at all Not difficult at all Not difficult at all Not difficult at all -   PHQ-2/9 Result is   Fall Risk: Fall Risk  02/23/2019 01/23/2019 12/26/2018 06/05/2018 03/06/2018  Falls in the past year? 0 0 0 0 0  Number falls in past yr: 0 0 0 0 -  Injury with Fall? 0 0 0 0 -  Follow up Falls evaluation completed Falls evaluation completed - - -      Objective:   There were no vitals filed for this visit.  There is no height or weight on file to calculate BMI.  Physical Exam   NAD, masked, pleasant HEENT - [place]/AT, sclera anicteric, PERRL, EOMI, conj - non-inj'ed, pharynx clear Neck - supple, no adenopathy, no TM, carotids 2+ and = without bruits bilat Car - RRR without m/g/r Pulm- RR and effort normal at rest, distant breath sounds, CTA without wheeze or rales Abd - soft, NT, ND, BS+,  no masses Back - no CVA tenderness Ext - no LE edema,  Neuro/psychiatric - affect was not flat, appropriate with conversation  Alert   Grossly non-focal   Speech normal   Results for orders placed or performed in visit on 06/15/19  HM DIABETES EYE EXAM  Result Value Ref Range   HM Diabetic Eye Exam No Retinopathy No Retinopathy       Assessment & Plan:   1. Essential  hypertension Blood pressure good on check today Continue current medication regimen and also follow-up with nephrology.  2. Stage 3 chronic kidney disease, unspecified whether stage 3a or 3b CKD Continue follow-up  with nephrology. Has remained stable in the recent past. Continue to avoid nephrotoxic agents further recommendations, and also staying well-hydrated important  3. OSA (obstructive sleep apnea) Patient does not wear his CPAP device, as stated he just does not think he needs to.  4. Tobacco abuse Strongly encouraged working towards complete tobacco cessation, and the importance of that over time.  Again trying to work on decreasing cigarettes per day to approach complete tobacco cessation encouraged  5. Hyperlipidemia - Low HDL (under 40) Remains on a statin presently, and to continue.  6. Type 2 diabetes mellitus with stage 3 chronic kidney disease, with long-term current use of insulin, unspecified whether stage 3a or 3b CKD ([place]) Continue to see endocrine, and emphasized the importance of getting better blood sugar control over time, and the importance of that and his efforts and trying to obtain that control.  7. Chronic bronchitis, unspecified chronic bronchitis type (HCC)/COPD Reviewed his current inhalers presently, and he notes when taking these, he seems to be doing pretty well. Again, tobacco cessation emphasis noted to help. If he does note his symptoms are increasing despite the inhalers, he needs to follow-up, and at some point may need pulmonary involvement over time pending his status.  8. Vitamin D deficiency Did encourage him to take the vitamin D supplement as did the nephrologist on his visit.   Emphasized the importance of continuing to see endocrine and nephrology as planned, We will follow-up again in about 6 months time, sooner as needed. Discussed potential of labs today, and he noted he gets them when he sees the specialists, and agreed at the latest to get repeat labs again on follow-up.    Towanda Malkin, MD 08/26/19 12:52 PM

## 2019-08-26 ENCOUNTER — Other Ambulatory Visit: Payer: Self-pay

## 2019-08-26 ENCOUNTER — Encounter: Payer: Self-pay | Admitting: Internal Medicine

## 2019-08-26 ENCOUNTER — Ambulatory Visit: Payer: BC Managed Care – PPO | Admitting: Internal Medicine

## 2019-08-26 ENCOUNTER — Other Ambulatory Visit: Payer: Self-pay | Admitting: Family Medicine

## 2019-08-26 VITALS — BP 108/74 | HR 69 | Temp 98.3°F | Resp 16 | Ht 68.0 in | Wt 192.7 lb

## 2019-08-26 DIAGNOSIS — Z720 Tobacco use: Secondary | ICD-10-CM | POA: Diagnosis not present

## 2019-08-26 DIAGNOSIS — N529 Male erectile dysfunction, unspecified: Secondary | ICD-10-CM

## 2019-08-26 DIAGNOSIS — I10 Essential (primary) hypertension: Secondary | ICD-10-CM | POA: Diagnosis not present

## 2019-08-26 DIAGNOSIS — N183 Chronic kidney disease, stage 3 unspecified: Secondary | ICD-10-CM | POA: Diagnosis not present

## 2019-08-26 DIAGNOSIS — Z794 Long term (current) use of insulin: Secondary | ICD-10-CM

## 2019-08-26 DIAGNOSIS — J42 Unspecified chronic bronchitis: Secondary | ICD-10-CM

## 2019-08-26 DIAGNOSIS — G4733 Obstructive sleep apnea (adult) (pediatric): Secondary | ICD-10-CM | POA: Diagnosis not present

## 2019-08-26 DIAGNOSIS — E781 Pure hyperglyceridemia: Secondary | ICD-10-CM

## 2019-08-26 DIAGNOSIS — E1122 Type 2 diabetes mellitus with diabetic chronic kidney disease: Secondary | ICD-10-CM

## 2019-08-26 DIAGNOSIS — E559 Vitamin D deficiency, unspecified: Secondary | ICD-10-CM

## 2019-08-26 DIAGNOSIS — E786 Lipoprotein deficiency: Secondary | ICD-10-CM

## 2019-08-26 MED ORDER — SILDENAFIL CITRATE 100 MG PO TABS: 100.0000 mg | ORAL_TABLET | Freq: Every day | ORAL | 2 refills | Status: DC

## 2019-08-26 MED ORDER — METOPROLOL TARTRATE 100 MG PO TABS: 100.0000 mg | ORAL_TABLET | Freq: Two times a day (BID) | ORAL | 3 refills | Status: DC

## 2019-08-26 MED ORDER — ATORVASTATIN CALCIUM 20 MG PO TABS: 20.0000 mg | ORAL_TABLET | Freq: Every day | ORAL | 3 refills | Status: DC

## 2019-08-26 NOTE — Telephone Encounter (Signed)
Requested medication (s) are due for refill today: yes  Requested medication (s) are on the active medication list: yes  Last refill:  07/30/2019  Future visit scheduled: no  Notes to clinic:  Medication not assigned to a protocol, review manually   Requested Prescriptions  Pending Prescriptions Disp [place] 2.5-2.5 MCG/ACT AERS [Pharmacy Med Name: STIOLTO RESPIMAT INHAL SPRAY] 4 g 5    Sig: INHALE 2 PUFFS BY MOUTH ONCE DAILY      Off-Protocol Failed - 08/26/2019 12:22 PM      Failed - Medication not assigned to a protocol, review manually.      Passed - Valid encounter within last 12 months    Recent Outpatient Visits           6 months ago Chronic bronchitis, unspecified chronic bronchitis type [facility])   [place], FNP   7 months ago Stage 3 chronic kidney disease, unspecified whether stage 3a or 3b CKD   [place], [place], FNP   8 months ago Annual physical exam   [place], [place]   1 year ago Essential hypertension   [place], NP   1 year ago Chronic bronchitis, unspecified chronic bronchitis type [facility])   [facility] [place], Satira Anis, MD

## 2019-09-14 ENCOUNTER — Telehealth: Payer: Self-pay

## 2019-09-14 NOTE — Telephone Encounter (Signed)
Patient notified that it is time to schedule the low dose lung cancer screening CT scan.  He states that he can't go for the CT scan right now but would like a call back in a month.

## 2019-10-03 IMAGING — CT CT CHEST LUNG CANCER SCREENING LOW DOSE W/O CM
1 of 2 series · 15 of 40 positions shown, 19 images · non-contrast
Comparison: No comparison studies available.

CLINICAL DATA: 61-year-old male with 41 pack-year history of
smoking. Lung cancer screening.

EXAM:
CT CHEST WITHOUT CONTRAST LOW-DOSE FOR LUNG CANCER SCREENING
TECHNIQUE: Multidetector CT imaging of the chest was performed following the
standard protocol without IV contrast.

[Series 2: axial st · axial · 0.80mm/px · z∈[-699,-394]mm · 15 of 67 slices shown, 19 images]
[im 3/67  mediastinal]
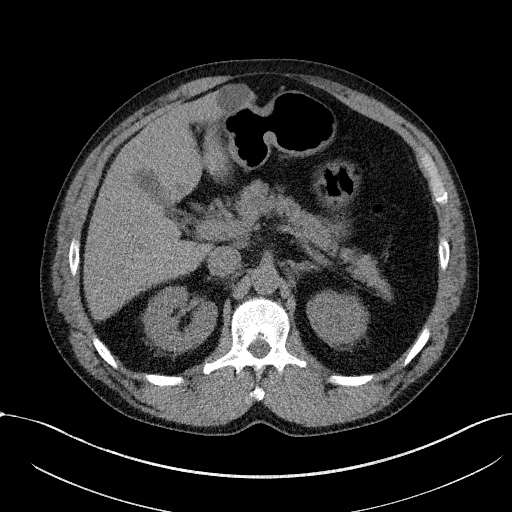
[im 3/67  lung]
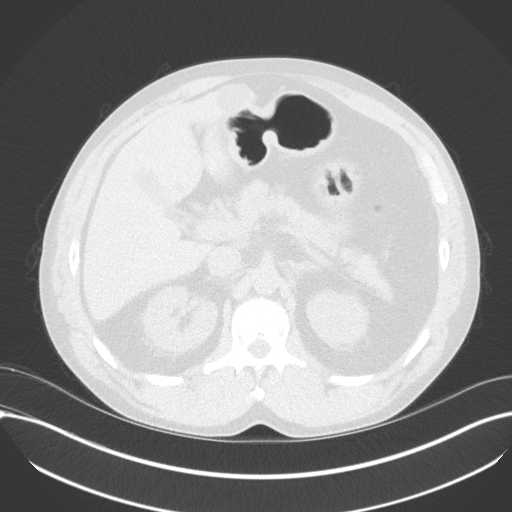
[im 8/67  lung]
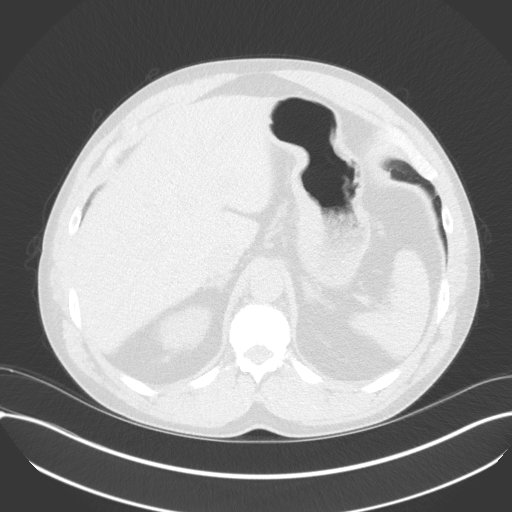
[im 13/67  lung]
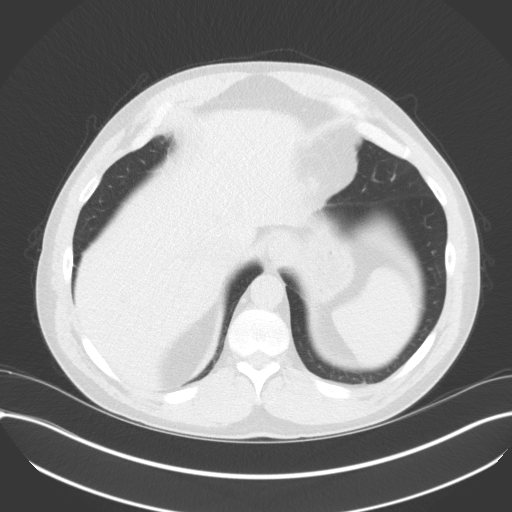
[im 17/67  lung]
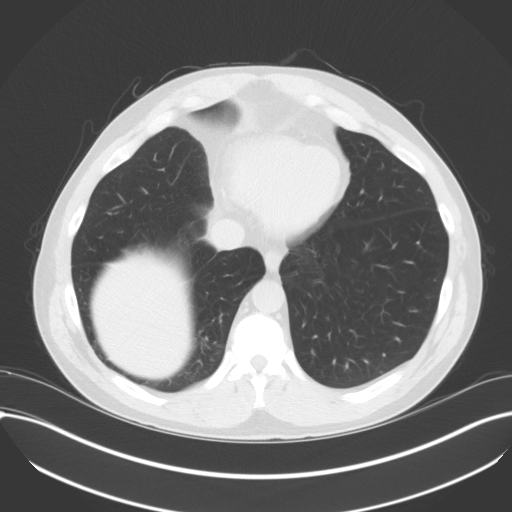
[im 21/67  mediastinal]
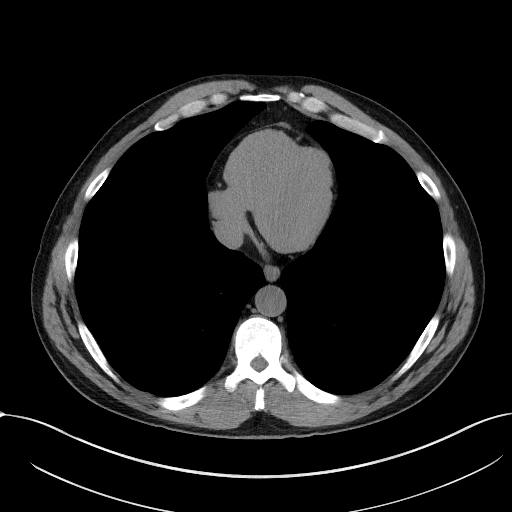
[im 21/67  lung]
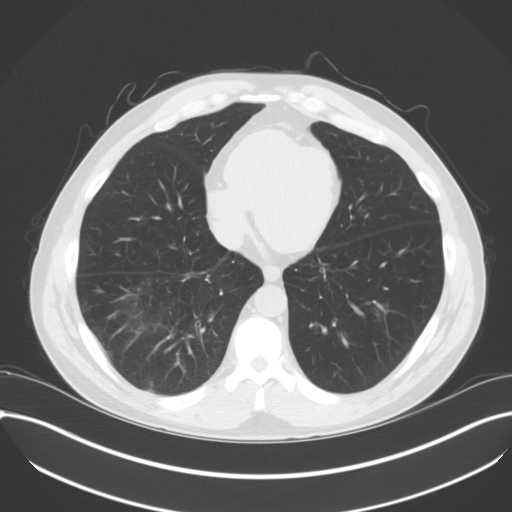
[im 26/67  lung]
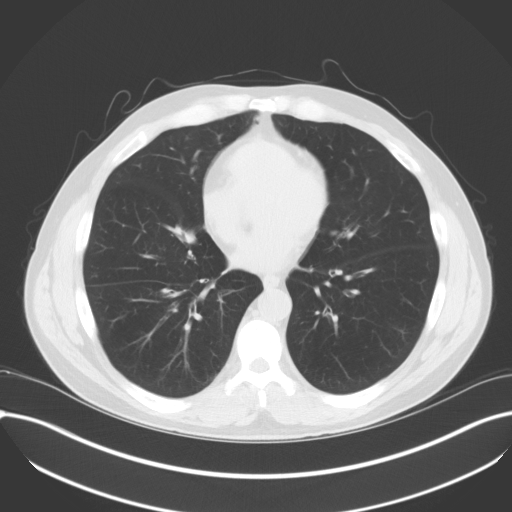
[im 31/67  lung]
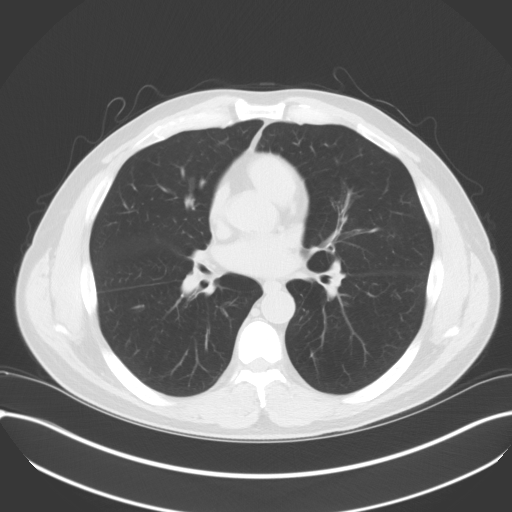
[im 34/67  lung]
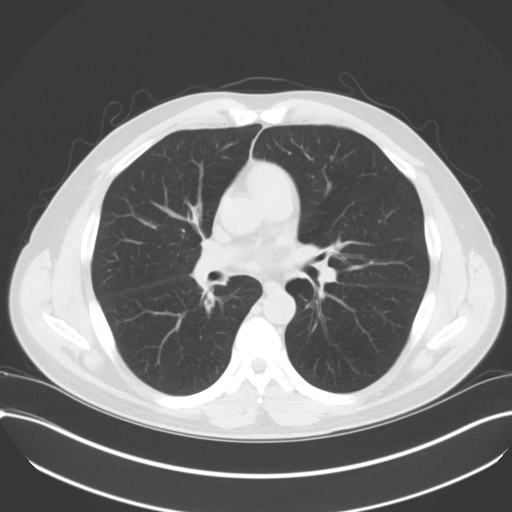
[im 36/67  mediastinal]
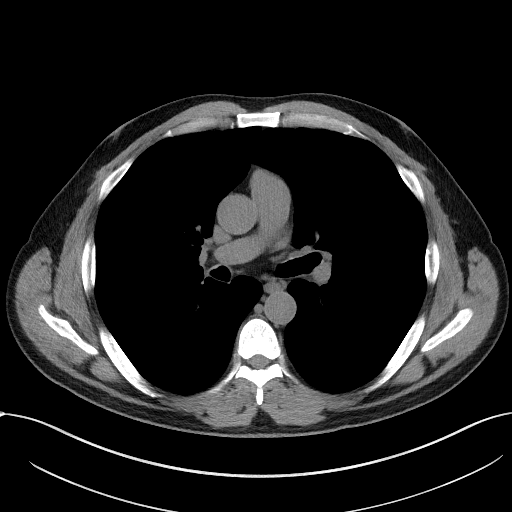
[im 36/67  lung]
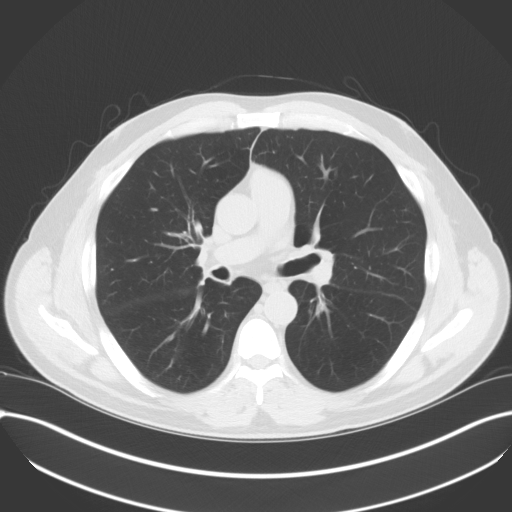
[im 41/67  lung]
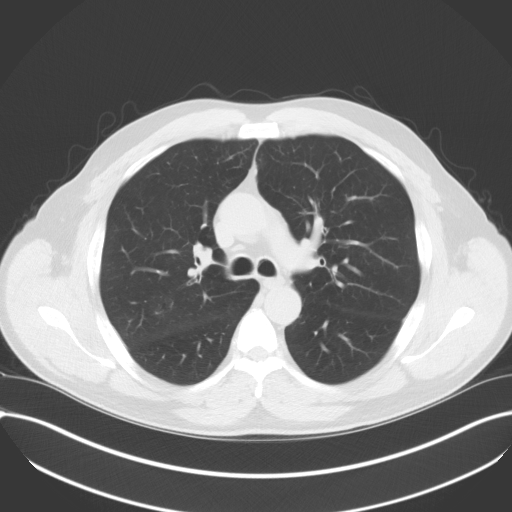
[im 46/67  lung]
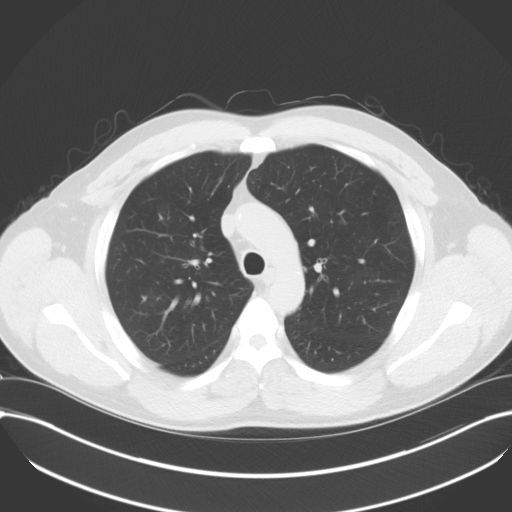
[im 50/67  lung]
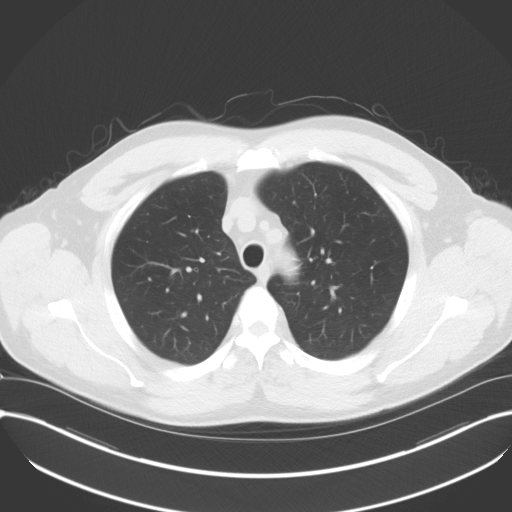
[im 54/67  mediastinal]
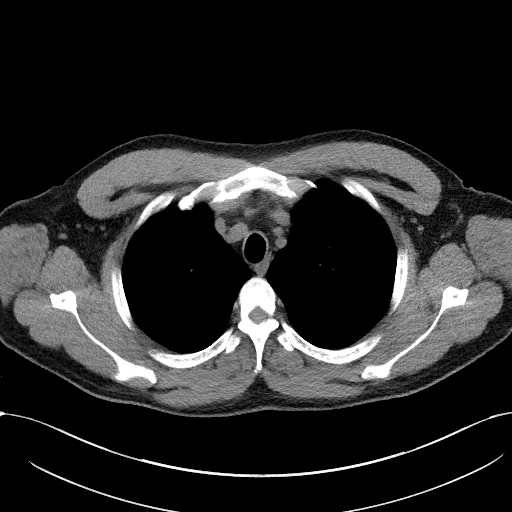
[im 54/67  lung]
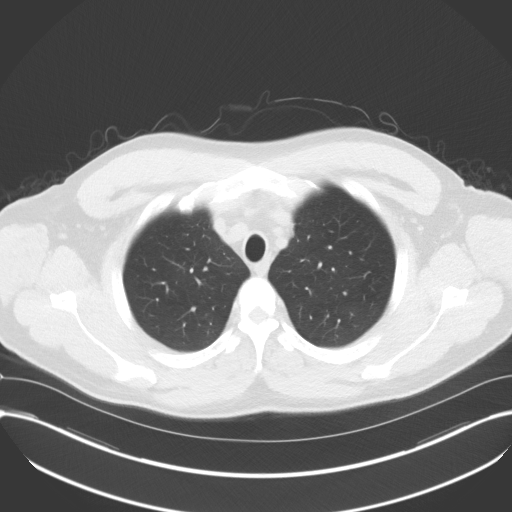
[im 59/67  lung]
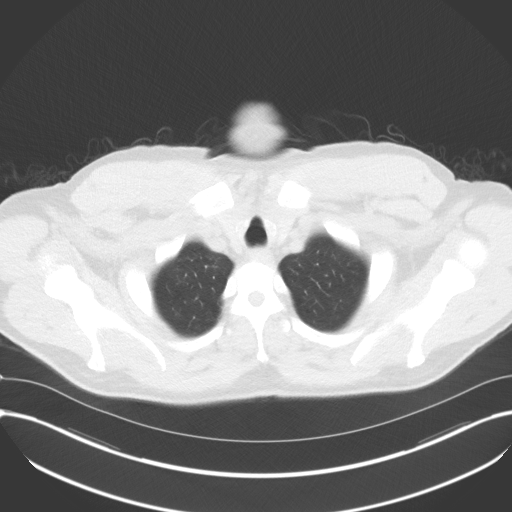
[im 64/67  lung]
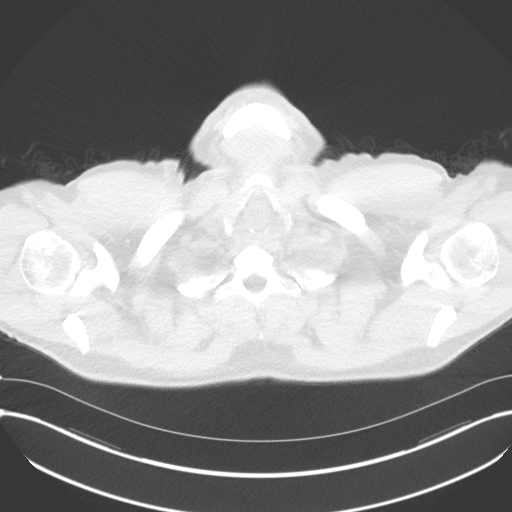

[15 of 40 positions shown; findings below may reference images not displayed]

FINDINGS: Cardiovascular: The heart size is normal.  No pericardial effusion.

Mediastinum/Nodes: No mediastinal lymphadenopathy. No evidence for
gross hilar lymphadenopathy although assessment is limited by the
lack of intravenous contrast on today's study. The esophagus has
normal imaging features. There is no axillary lymphadenopathy.

Lungs/Pleura: Centrilobular emphysema is associated with diffuse
bronchial wall thickening. No suspicious pulmonary nodule or mass.
No focal airspace consolidation. No pulmonary edema or pleural
effusion.

Upper Abdomen: 2.6 cm cyst identified lateral segment left liver.
Other tiny hypoattenuating lesions in the liver parenchyma cannot be
definitively characterized but are likely cysts.

Musculoskeletal: Bone windows reveal no worrisome lytic or sclerotic
osseous lesions.
IMPRESSION: 1. Lung-RADS 1, negative. Continue annual screening with low-dose
chest CT without contrast in 12 months.
2.  Emphysema. ([id number])

## 2019-10-05 ENCOUNTER — Telehealth: Payer: Self-pay

## 2019-10-05 DIAGNOSIS — Z122 Encounter for screening for malignant neoplasm of respiratory organs: Secondary | ICD-10-CM

## 2019-10-05 DIAGNOSIS — Z87891 Personal history of nicotine dependence: Secondary | ICD-10-CM

## 2019-10-05 NOTE — Telephone Encounter (Signed)
Patient has been notified that the low dose lung cancer screening CT scan is due currently or will be in near future.  Confirmed that patient is within the appropriate age range and asymptomatic, (no signs or symptoms of lung cancer).  Patient denies illness that would prevent curative treatment for lung cancer if found.  Patient is agreeable for CT scan being scheduled.    Verified smoking history (current smoker, with 41 year 0.25 ppd history).   CT scheduled for 10/09/19 @ 4:30

## 2019-10-06 NOTE — Addendum Note (Signed)
Addended by: Lieutenant Diego on: 10/06/2019 11:30 AM   Modules accepted: Orders

## 2019-10-06 NOTE — Telephone Encounter (Signed)
Smoking history: current, 41.75 pack year

## 2019-10-09 ENCOUNTER — Ambulatory Visit
Admission: RE | Admit: 2019-10-09 | Discharge: 2019-10-09 | Disposition: A | Discharge status: Home / Self Care | Payer: BC Managed Care – PPO | Source: Ambulatory Visit | Attending: Oncology | Admitting: Oncology

## 2019-10-09 ENCOUNTER — Other Ambulatory Visit: Payer: Self-pay

## 2019-10-09 DIAGNOSIS — Z122 Encounter for screening for malignant neoplasm of respiratory organs: Secondary | ICD-10-CM | POA: Insufficient documentation

## 2019-10-09 DIAGNOSIS — Z87891 Personal history of nicotine dependence: Secondary | ICD-10-CM | POA: Diagnosis present

## 2019-10-14 ENCOUNTER — Encounter: Payer: Self-pay | Admitting: *Deleted

## 2019-11-20 ENCOUNTER — Other Ambulatory Visit: Payer: Self-pay | Admitting: Internal Medicine

## 2019-11-20 DIAGNOSIS — J42 Unspecified chronic bronchitis: Secondary | ICD-10-CM

## 2019-12-18 ENCOUNTER — Other Ambulatory Visit: Payer: Self-pay | Admitting: Internal Medicine

## 2019-12-18 DIAGNOSIS — I10 Essential (primary) hypertension: Secondary | ICD-10-CM

## 2019-12-18 DIAGNOSIS — N529 Male erectile dysfunction, unspecified: Secondary | ICD-10-CM

## 2019-12-26 ENCOUNTER — Other Ambulatory Visit: Payer: Self-pay | Admitting: Family Medicine

## 2019-12-26 DIAGNOSIS — J42 Unspecified chronic bronchitis: Secondary | ICD-10-CM

## 2019-12-26 NOTE — Telephone Encounter (Signed)
Requested medication (s) are due for refill today: yes  Requested medication (s) are on the active medication list: yes  Last refill:  08/26/19  Future visit scheduled: yes  Notes to clinic:  med not assigned to a protocol   Requested Prescriptions  Pending Prescriptions Disp [place] 2.5-2.5 MCG/ACT AERS [Pharmacy Med Name: STIOLTO RESPIMAT INHAL SPRAY] 4 g 0    Sig: INHALE 2 PUFFS BY MOUTH ONCE DAILY      Off-Protocol Failed - 12/26/2019 10:47 AM      Failed - Medication not assigned to a protocol, review manually.      Passed - Valid encounter within last 12 months    Recent Outpatient Visits           4 months ago Essential hypertension   [place], MD   10 months ago Chronic bronchitis, unspecified chronic bronchitis type [facility])   [place], FNP   11 months ago Stage 3 chronic kidney disease, unspecified whether stage 3a or 3b CKD   [place], [place], FNP   1 year ago Annual physical exam   [place], [place], FNP   1 year ago Essential hypertension   [place], [place], NP       Future Appointments             In 2 months Towanda Malkin, MD [facility], [facility]

## 2020-01-30 ENCOUNTER — Other Ambulatory Visit: Payer: Self-pay | Admitting: Internal Medicine

## 2020-01-30 DIAGNOSIS — I10 Essential (primary) hypertension: Secondary | ICD-10-CM

## 2020-02-26 NOTE — Progress Notes (Signed)
Patient ID: Zachary Wallace, male    DOB: 12-12-1955, 64 y.o.   MRN: [id number]  PCP: Towanda Malkin, MD  Chief Complaint  Patient presents with  . Follow-up    Subjective:   Zachary Wallace is a 64 y.o. male, presents to clinic with CC of the following:  Chief Complaint  Patient presents with  . Follow-up    HPI:  Patient is a 64 y.o. male  Last visit with me was August 26, 2019 F/U's today  Last saw endo 10/8//21 Last nephrology visit - 07/29/19  All in all, he notes he is feeling "pretty good".    HTN Med regimen -  current regimen includes Losartan-HCTZ, metoprolol, amlodipine.  taking medications as instructed Does not check BP's at home no chest pain,  no swelling of ankles, no palpitations, no increased HA's or vision changes BP Readings from Last 3 Encounters:  02/29/20 110/80  08/26/19 108/74  02/23/19 128/76    per last nephrology visit in May: In the future, showed further antihypertensive control be needed, I recommend reinitiating his Aldactone. If he becomes hypotensive, I would prefer to leave his HCTZ, and losartan doses alone and reduce the amlodipine. His ideal BP goal is <130/80.    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 On hemodialysis for approximately 4 years, off for past >5 years. Chronic kidney disease is stable. Patient is seen every 6 months by [facility],  And per last assessment in May, 2021: Chronic Kidney Disease Stage III - his renal disease does appear to be stable with a baseline creatinine ranging 1.7-2.3 over the past 7 years. At this time, no change in his medication is indicated and there is no acute indication for dialysis. We reminded him to avoid nephrotoxic agents.  advised to avoid NSAIDS, drinks plenty of water;  Cr - 1.9 in April 2021, 1.6 in Sept 2020, 1.9 in Feb 2020   Has not followed with [facility] nephrology since May visit,  rec'ed a f/u again, may be sooner than the 1 year after the last visit that he noted was plan pending some lab tests today.  OSA: Does not wear his CPAP - states it fits okay, just does not like sleeping with it.   COPD/Tobacco Abuse: Notes has decreased to 1 pack lasting about 3 days on last visit with endocrine and that he does not want to quit smoking completely.  Today he noted a pack lasting 2 days Encouraged again to try to continue to lessen cigarette use to reach complete cessation, and the importance of that. He does get some shortness of breath with exertion still, no significant increase.  No chest pain with exertion. Med regimen - Stiolto inhaler once daily, and albuterol about 2 times a day when needed.  Diabetes mellitustype 2- Seeing endocrinology at [facility], last visit 01/01/20 with A1c 6.4, and the following assessment/plan:  Assessment/Plan: Diabetes -  - We discussed diabetes, its complications, and goals of care. Patient was advised of the importance of diet and behavioral changes to improve glycemic control.  -Diabetes is well controlled. - I will adjust current diabetic medication regimen as follows: - Continue Trulicity 4.09 mg WEEKLY - Continue Actos 15 mg daily -ContinueTresiba28 units each morning.   Patient was advised of the importance of regular monitoring of blood sugars.  - Instructed to monitor blood sugars fasting each morning and again before bed -Patient is  reminded to bring blood sugar log and/or meter to every visit  Patient was advised of the importance of a low carbohydrate diet. Patient was advised of the importance of achieving and maintaining a healthy body weight. He is advised to follow a low carbohydrate diet with intake of lean proteins, supplemental vegetables and low-fat food choices. The importance of portion control is emphasized. Patient is encouraged to participate in regular exercise activity, striving for 30 minutes of sustained  exercise activity 4-5 times weekly.  Hypertension -  -Blood pressure is well controlled. Nephrology has recommended blood pressure be kept under 130/80. Continue amlodipine, metoprolol and losartan  Hyperlipidemia -  -LDL at goal. Patient denies myalgia. Continue atorvastatin. 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 On hemodialysis for approximately 4 years, off for the past 6 years. Chronic kidney disease is stable. Patient is seen every 6 months by [facility], Dr. Di Kindle.   Tobacco dependency -  -Reports that he is decreased from smoking 1 pack of cigarettes daily to1 pack every 3 days. Does not want to stop smoking completely. He is again encouraged to consider decreasing cigarette usage by 1 cigarette daily towards a goal of smoking cessation.  History of Vitamin D deficiency -  Vitamin D level remains low. Continue daily supplementation 2000 units.   Return to clinic in 3 months for follow-up    ACEI/ARB:Yes Statin:Yes Denies: polydipsia, vision changes, numbness or tingling in his extremities, no increased frequency.  HLD: Has history of high triglycerides and low HDL;  taking atorvastatin 20 mg and tolerating well. Lab Results  Component Value Date   CHOL 95 01/23/2019   HDL 35 (L) 01/23/2019   LDLCALC 37 01/23/2019   TRIG 147 01/23/2019   CHOLHDL 2.7 01/23/2019   Denies  myalgias.   Vitamin D Deficiency -      Lab Results  Component Value Date   VD25OH 20 (L) 01/23/2019   Per last nephrology note: I recommend reinitiation of over-the-counter vitamin D of 2000 international units once daily, also recommended by endocrine He has not resumed to taking any vitamin D over-the-counter supplements.  I did encourage him to do so today as well, and await recheck of the lab test.  Did note may need a  prescription strength supplement pending that result.  History of Epilepsy: Has had about 4 in his lifetime; last seizure was about 15 years ago. Not on any prevention medication (has never been) and no issues at this time.  Labs in [place] system reviewed.        Patient Active Problem List   Diagnosis Date Noted  . OSA (obstructive sleep apnea) 06/04/2018  . Tubular adenoma of colon   . Low HDL (under 40) 02/13/2017  . Tobacco abuse 05/16/2016  . High triglycerides 05/16/2016  . COPD (chronic obstructive pulmonary disease) ([place]) 02/10/2015  . Lumbar radiculopathy 01/10/2015  . Left hip pain 01/10/2015  . Abnormal presence of protein in urine 10/06/2013  . Vitamin D deficiency 10/11/2011  . Diabetes mellitus ([place]) 10/11/2011  . Chronic kidney disease (CKD), stage III (moderate) ([place]) 08/15/2010  . Convulsions, epileptic ([place]) 08/15/2010  . BP (high blood pressure) 08/15/2010  . Epilepsy ([place]) 08/15/2010      Current Outpatient Medications:  .  albuterol (VENTOLIN HFA) 108 (90 Base) MCG/ACT inhaler, INHALE 1-2 PUFFS BY MOUTH EVERY 6 HOURS AS NEEDED  FOR WHEEZING OR SHORTNESS OF BREATH, Disp: 18 g, Rfl: 3 .  amLODipine (NORVASC) 10 MG tablet, TAKE (1) TABLET BY MOUTH DAILY FOR HIGH BLOOD PRESSURE., Disp: 90 tablet, Rfl: 3 .  aspirin 81 MG tablet, Take 81 mg by mouth., Disp: , Rfl:  .  atorvastatin (LIPITOR) 20 MG tablet, Take 1 tablet (20 mg total) by mouth at bedtime., Disp: 90 tablet, Rfl: 3 .  B-D UF III MINI PEN NEEDLES 31G X 5 MM MISC, , Disp: , Rfl:  .  glucose blood (ONETOUCH VERIO) test strip, , Disp: , Rfl:  .  insulin degludec (TRESIBA FLEXTOUCH) 100 UNIT/ML SOPN FlexTouch Pen, Inject into the skin., Disp: , Rfl:  .  losartan-hydrochlorothiazide (HYZAAR) 100-12.5 MG tablet, Take 1 tablet by mouth daily., Disp: 90 tablet, Rfl: 3 .  metoprolol tartrate (LOPRESSOR) 100 MG tablet, Take 1 tablet (100 mg total) by mouth 2 (two) times daily., Disp: 180 tablet, Rfl: 3 .   Olopatadine HCl 0.2 % SOLN, Apply 1 drop to eye daily., Disp: 2.5 mL, Rfl: 3 .  STIOLTO RESPIMAT 2.5-2.5 MCG/ACT AERS, INHALE 2 PUFFS BY MOUTH ONCE DAILY, Disp: 4 g, Rfl: 5 .  VIAGRA 100 MG tablet, Take 1 tablet (100 mg total) by mouth daily. Take as needed prior to planned sexual activities, Disp: 6 tablet, Rfl: 3   No Known Allergies   Past Surgical History:  Procedure Laterality Date  . COLONOSCOPY WITH PROPOFOL N/A 02/04/2018   Procedure: COLONOSCOPY WITH PROPOFOL;  Surgeon: Lin Landsman, MD;  Location: [facility];  Service: Gastroenterology;  Laterality: N/A;  . HAND SURGERY Left   . KNEE SURGERY Left      Family History  Problem Relation Age of Onset  . Clotting disorder Mother   . Diabetes Mother   . Hypertension Mother   . Heart attack Father   . Hypertension Sister   . Hypertension Brother   . Vision loss Brother      Social History   Tobacco Use  . Smoking status: Current Every Day Smoker    Packs/day: 1.00    Years: 41.00    Pack years: 41.00  . Smokeless tobacco: Never Used  Substance Use Topics  . Alcohol use: No    Alcohol/week: 0.0 standard drinks    With staff assistance, above reviewed with the patient today.  ROS: As per HPI, otherwise no specific complaints on a limited and focused system review   No results found for this or any previous visit (from the past 72 hour(s)).   PHQ2/9: Depression screen [facility] 2/9 02/29/2020 08/26/2019 02/23/2019 01/23/2019 12/26/2018  Decreased Interest 0 0 0 0 -  Down, Depressed, Hopeless 0 0 0 0 0  PHQ - 2 Score 0 0 0 0 0  Altered sleeping - 1 0 0 2  Tired, decreased energy - 0 0 0 0  Change in appetite - 0 0 0 0  Feeling bad or failure about yourself  - 0 0 0 0  Trouble concentrating - 0 0 0 0  Moving slowly or fidgety/restless - 0 0 0 0  Suicidal thoughts - 0 0 0 0  PHQ-9 Score - 1 0 0 2  Difficult doing work/chores - Not difficult at all Not difficult at all Not difficult at all Not difficult at  all   PHQ-2/9 Result is neg  Fall Risk: Fall Risk  02/29/2020 08/26/2019 02/23/2019 01/23/2019 12/26/2018  Falls in the past year? 0 0 0 0 0  Number falls in  past yr: 0 0 0 0 0  Injury with Fall? 0 0 0 0 0  Follow up - - Falls evaluation completed Falls evaluation completed -      Objective:   Vitals:   02/29/20 0802  BP: 110/80  Pulse: 77  Resp: 16  Temp: (!) 97.3 F (36.3 C)  TempSrc: Oral  SpO2: 92%  Weight: 195 lb 8 oz (88.7 kg)  Height: 5\' 8"  (1.727 m)    Body mass index is 29.73 kg/m.  Physical Exam   NAD, masked, pleasant HEENT - [place]/AT, sclera anicteric, PERRL, EOMI, conj - non-inj'ed, pharynx clear Neck - supple, no adenopathy, no TM, carotids 2+ and = without bruits bilat Car - RRR without m/g/r Pulm- RR and effort normal at rest,  slightly distant breath sounds, CTA without wheeze or rales Abd - soft, NT diffusely, ND,  Back - no CVA tenderness Ext - no LE edema,  Neuro/psychiatric - affect was not flat, appropriate with conversation             Alert              Grossly non-focal              Speech normal   Results for orders placed or performed in visit on 06/15/19  HM DIABETES EYE EXAM  Result Value Ref Range   HM Diabetic Eye Exam No Retinopathy No Retinopathy   Prior labs reviewed.    Assessment & Plan:   1. Essential hypertension Blood pressure remains well controlled Continue current medication regimen. We will check labs today  2. Stage 3 chronic kidney disease, unspecified whether stage 3a or 3b CKD Continue follow-up with nephrology recommended and he should call to make a follow-up visit, as did not see 1 in epic.  He noted was to follow-up in a years time, and noted it may need to happen sooner pending recheck of his labs today if the creatinine is worsening. Kidney function has remained relatively stable in the recent past, creatinine slightly higher on last check at 1.9. We'll recheck labs today.. Continue to avoid nephrotoxic  agents and also staying well-hydrated important  3. OSA (obstructive sleep apnea) Patient does not wear his CPAP device, as stated he just does not think he needs to.  4. Tobacco abuse Strongly encouraged working towards complete tobacco cessation, and the importance of that over time.  Again trying to work on decreasing cigarettes per day to approach complete tobacco cessation encouraged  5. Hyperlipidemia - Low HDL (under 40) Remains on a statin presently, and to continue Recheck lipid panel today.  6. Type 2 diabetes mellitus with stage 3 chronic kidney disease, with long-term current use of insulin, unspecified whether stage 3a or 3b CKD ([place]) Continue to see endocrine, and emphasized the importance of good blood sugar control over time  7. Chronic bronchitis, unspecified chronic bronchitis type (HCC)/COPD Reviewed his current inhalers presently, and he notes when taking his current regimen, he seems to be doing pretty well. Again, tobacco cessation emphasis noted to help. If he does note his symptoms are increasing despite the inhalers, he needs to follow-up, and at some point may need pulmonary involvement over time pending his status.  8. Vitamin D deficiency Did encourage him to take the vitamin D supplement as did the nephrologist and endocrinologist on his prior visits We'll recheck a vitamin D level today   Emphasized the importance of continuing to see endocrine. Recommended continuing follow-up with nephrology (last  creatinine was slightly increased to 1.9, and await recheck today). Await lab results ordered today. will follow-up again in about 6 months time, sooner as needed. I did note to him today that his follow-up here will be with a new provider, as I will be leaving this practice prior to his planned follow-up       Towanda Malkin, MD 02/29/20 8:07 AM

## 2020-02-29 ENCOUNTER — Ambulatory Visit: Payer: BC Managed Care – PPO | Admitting: Internal Medicine

## 2020-02-29 ENCOUNTER — Other Ambulatory Visit: Payer: Self-pay | Admitting: Internal Medicine

## 2020-02-29 ENCOUNTER — Other Ambulatory Visit: Payer: Self-pay

## 2020-02-29 ENCOUNTER — Encounter: Payer: Self-pay | Admitting: Internal Medicine

## 2020-02-29 VITALS — BP 110/80 | HR 77 | Temp 97.3°F | Resp 16 | Ht 68.0 in | Wt 195.5 lb

## 2020-02-29 DIAGNOSIS — G4733 Obstructive sleep apnea (adult) (pediatric): Secondary | ICD-10-CM

## 2020-02-29 DIAGNOSIS — N529 Male erectile dysfunction, unspecified: Secondary | ICD-10-CM

## 2020-02-29 DIAGNOSIS — J42 Unspecified chronic bronchitis: Secondary | ICD-10-CM

## 2020-02-29 DIAGNOSIS — I10 Essential (primary) hypertension: Secondary | ICD-10-CM

## 2020-02-29 DIAGNOSIS — Z794 Long term (current) use of insulin: Secondary | ICD-10-CM

## 2020-02-29 DIAGNOSIS — Z720 Tobacco use: Secondary | ICD-10-CM | POA: Diagnosis not present

## 2020-02-29 DIAGNOSIS — N183 Chronic kidney disease, stage 3 unspecified: Secondary | ICD-10-CM

## 2020-02-29 DIAGNOSIS — E785 Hyperlipidemia, unspecified: Secondary | ICD-10-CM

## 2020-02-29 DIAGNOSIS — E1122 Type 2 diabetes mellitus with diabetic chronic kidney disease: Secondary | ICD-10-CM

## 2020-02-29 DIAGNOSIS — E559 Vitamin D deficiency, unspecified: Secondary | ICD-10-CM

## 2020-03-01 LAB — COMPLETE METABOLIC PANEL WITH GFR
AG Ratio: 1.5 (calc) (ref 1.0–2.5)
ALT: 15 U/L (ref 9–46)
AST: 18 U/L (ref 10–35)
Albumin: 4.4 g/dL (ref 3.6–5.1)
Alkaline phosphatase (APISO): 73 U/L (ref 35–144)
BUN/Creatinine Ratio: 8 (calc) (ref 6–22)
BUN: 17 mg/dL (ref 7–25)
CO2: 30 mmol/L (ref 20–32)
Calcium: 9.7 mg/dL (ref 8.6–10.3)
Chloride: 101 mmol/L (ref 98–110)
Creat: 2.02 mg/dL — ABNORMAL HIGH (ref 0.70–1.25)
GFR, Est African American: 40 mL/min/{1.73_m2} — ABNORMAL LOW (ref >=60)
GFR, Est Non African American: 34 mL/min/{1.73_m2} — ABNORMAL LOW (ref >=60)
Globulin: 2.9 g/dL (calc) (ref 1.9–3.7)
Glucose, Bld: 109 mg/dL — ABNORMAL HIGH (ref 65–99)
Potassium: 4 mmol/L (ref 3.5–5.3)
Sodium: 136 mmol/L (ref 135–146)
Total Bilirubin: 0.4 mg/dL (ref 0.2–1.2)
Total Protein: 7.3 g/dL (ref 6.1–8.1)

## 2020-03-01 LAB — LIPID PANEL
Cholesterol: 161 mg/dL (ref <200)
HDL: 32 mg/dL — ABNORMAL LOW (ref >=40)
Non-HDL Cholesterol (Calc): 129 mg/dL (calc) (ref <130)
Total CHOL/HDL Ratio: 5 (calc) — ABNORMAL HIGH (ref <5.0)
Triglycerides: 451 mg/dL — ABNORMAL HIGH (ref <150)

## 2020-03-01 LAB — VITAMIN D 25 HYDROXY (VIT D DEFICIENCY, FRACTURES): Vit D, 25-Hydroxy: 25 ng/mL — ABNORMAL LOW (ref 30–100)

## 2020-03-29 LAB — HEMOGLOBIN A1C: Hemoglobin A1C: 5.6

## 2020-03-30 ENCOUNTER — Other Ambulatory Visit: Payer: Self-pay | Admitting: Internal Medicine

## 2020-03-30 DIAGNOSIS — I10 Essential (primary) hypertension: Secondary | ICD-10-CM

## 2020-05-20 ENCOUNTER — Other Ambulatory Visit: Payer: Self-pay

## 2020-05-20 ENCOUNTER — Ambulatory Visit (INDEPENDENT_AMBULATORY_CARE_PROVIDER_SITE_OTHER): Payer: BC Managed Care – PPO | Admitting: Family Medicine

## 2020-05-20 ENCOUNTER — Encounter: Payer: Self-pay | Admitting: Family Medicine

## 2020-05-20 VITALS — BP 128/82 | HR 84 | Temp 98.0°F | Resp 18 | Ht 68.0 in | Wt 192.2 lb

## 2020-05-20 DIAGNOSIS — N529 Male erectile dysfunction, unspecified: Secondary | ICD-10-CM

## 2020-05-20 DIAGNOSIS — N1832 Chronic kidney disease, stage 3b: Secondary | ICD-10-CM

## 2020-05-20 DIAGNOSIS — E781 Pure hyperglyceridemia: Secondary | ICD-10-CM

## 2020-05-20 DIAGNOSIS — J42 Unspecified chronic bronchitis: Secondary | ICD-10-CM

## 2020-05-20 DIAGNOSIS — E1122 Type 2 diabetes mellitus with diabetic chronic kidney disease: Secondary | ICD-10-CM | POA: Diagnosis not present

## 2020-05-20 DIAGNOSIS — E782 Mixed hyperlipidemia: Secondary | ICD-10-CM

## 2020-05-20 DIAGNOSIS — I10 Essential (primary) hypertension: Secondary | ICD-10-CM

## 2020-05-20 DIAGNOSIS — N183 Chronic kidney disease, stage 3 unspecified: Secondary | ICD-10-CM

## 2020-05-20 DIAGNOSIS — J45909 Unspecified asthma, uncomplicated: Secondary | ICD-10-CM

## 2020-05-20 DIAGNOSIS — Z794 Long term (current) use of insulin: Secondary | ICD-10-CM

## 2020-05-20 MED ORDER — SILDENAFIL CITRATE 100 MG PO TABS: ORAL_TABLET | ORAL | 5 refills | Status: DC

## 2020-05-20 MED ORDER — STIOLTO RESPIMAT 2.5-2.5 MCG/ACT IN AERS: INHALATION_SPRAY | RESPIRATORY_TRACT | 5 refills | Status: DC

## 2020-05-20 MED ORDER — ATORVASTATIN CALCIUM 20 MG PO TABS: 20.0000 mg | ORAL_TABLET | Freq: Every day | ORAL | 3 refills | Status: DC

## 2020-05-20 MED ORDER — IPRATROPIUM-ALBUTEROL 0.5-2.5 (3) MG/3ML IN SOLN: 3.0000 mL | Freq: Three times a day (TID) | RESPIRATORY_TRACT | 1 refills | Status: DC | PRN

## 2020-05-20 MED ORDER — ALBUTEROL SULFATE HFA 108 (90 BASE) MCG/ACT IN AERS: INHALATION_SPRAY | RESPIRATORY_TRACT | 2 refills | Status: DC

## 2020-05-20 MED ORDER — NEBULIZER/TUBING/MOUTHPIECE KIT: PACK | 0 refills | Status: DC

## 2020-05-20 NOTE — Progress Notes (Signed)
Name: Zachary Wallace   MRN: [id number]    DOB: 25-Jun-1955   Date:05/20/2020       Progress Note  Chief Complaint  Patient presents with  . Follow-up  . Hypertension  . Hyperlipidemia     Subjective:   Zachary Wallace is a 65 y.o. male, presents to clinic for routine f/up, pt new to me, recently established with Dr. Roxan Hockey who recently moved out of the area  Hyperlipidemia: Currently treated with lipitor 20 mg, pt reports good med compliance He believes his endocrinologist checked his lipids more recently and he was told it was well controlled with his current medications Last Lipids: Lab Results  Component Value Date   CHOL 161 02/29/2020   HDL 32 (L) 02/29/2020   [place]  02/29/2020     Comment:     . LDL cholesterol not calculated. Triglyceride levels greater than 400 mg/dL invalidate calculated LDL results. . Reference range: <100 . Desirable range <100 mg/dL for primary prevention;   <70 mg/dL for patients with CHD or diabetic patients  with > or = 2 CHD risk factors. Marland Kitchen LDL-C is now calculated using the Martin-Hopkins  calculation, which is a validated novel method providing  better accuracy than the Friedewald equation in the  estimation of LDL-C.  Cresenciano Genre et al. Annamaria Helling. 4098;119(14): 2061-2068  (http://education.[facility].com/faq/FAQ164)    TRIG 451 (H) 02/29/2020   CHOLHDL 5.0 (H) 02/29/2020   - Denies: Chest pain, shortness of breath, myalgias, claudication  Lipids with Endocrinology at  LDL 41 03/29/2020  Insulin-dependent diabetes, well controlled, managed by [facility] A1C last month 5.9 well controlled tresiba - 28 trulicity weekly actos 15 mg He states he is up-to-date on his foot exam and eye exam  Asthma/COPD - summers worse -typically He reports a history of asthma but also a significant smoking history states he is a current smoker, is not hopeful that he will ever be able to quit and is not interested in smoking cessation  right now. Smoking cessation instruction/counseling given:  counseled patient on the dangers of tobacco use, advised patient to stop smoking, and reviewed strategies to maximize success Offered resources and currently declined He does get short of breath when he mows the lawn but he can go up several flights of stairs without any DOE, sometimes he has nighttime wakening and wheezing or coughing He does not use his rescue inhaler that often -does not have a nebulizer machine He was on Trelegy but it caused him back pain so he switched to daily STIOLTO RESPIMAT -no recent exacerbation   Hypertension:  Currently managed on amlodipine 10 mg, losartan-HCTZ 100-12.5, metoprolol 100 mg daily Pt reports good med compliance and denies any SE.   Blood pressure today is well controlled. BP Readings from Last 3 Encounters:  05/20/20 128/82  02/29/20 110/80  08/26/19 108/74   Pt denies CP, SOB, exertional sx, LE edema, palpitation, Ha's, visual disturbances, lightheadedness, hypotension, syncope.  ED-he states brand-name Viagra is effective for him but expensive he would like a refill He denies any concerning side effects when taking medicine and states it is effective specifically denies any headaches, lightheadedness, diaphoresis, chest pain or shortness of breath    Current Outpatient Medications:  .  albuterol (VENTOLIN HFA) 108 (90 Base) MCG/ACT inhaler, INHALE 1-2 PUFFS BY MOUTH EVERY 6 HOURS AS NEEDED FOR WHEEZING OR SHORTNESS OF BREATH, Disp: 18 g, Rfl: 2 .  aspirin 81 MG tablet, Take 81 mg by mouth., Disp: , Rfl:  .  atorvastatin (LIPITOR) 20 MG tablet, Take 1 tablet (20 mg total) by mouth at bedtime., Disp: 90 tablet, Rfl: 3 .  B-D UF III MINI PEN NEEDLES 31G X 5 MM MISC, , Disp: , Rfl:  .  cyclobenzaprine (FLEXERIL) 10 MG tablet, Take 10 mg by mouth as needed., Disp: , Rfl:  .  Dulaglutide 0.75 MG/0.5ML SOPN, Inject 0.5 mg into the skin once a week., Disp: , Rfl:  .   Fluticasone-Umeclidin-Vilant (TRELEGY ELLIPTA) 100-62.5-25 MCG/INH AEPB, , Disp: , Rfl:  .  glucose blood (ONETOUCH VERIO) test strip, , Disp: , Rfl:  .  insulin degludec (TRESIBA) 100 UNIT/ML FlexTouch Pen, Inject into the skin., Disp: , Rfl:  .  losartan-hydrochlorothiazide (HYZAAR) 100-12.5 MG tablet, TAKE 1 TABLET BY MOUTH ONCE DAILY., Disp: 90 tablet, Rfl: 1 .  metoprolol tartrate (LOPRESSOR) 100 MG tablet, Take 1 tablet (100 mg total) by mouth 2 (two) times daily., Disp: 180 tablet, Rfl: 3 .  Olopatadine HCl 0.2 % SOLN, Apply 1 drop to eye daily., Disp: 2.5 mL, Rfl: 3 .  pioglitazone (ACTOS) 15 MG tablet, Take 15 mg by mouth daily., Disp: , Rfl:  .  STIOLTO RESPIMAT 2.5-2.5 MCG/ACT AERS, INHALE 2 PUFFS BY MOUTH ONCE DAILY, Disp: 4 g, Rfl: 5 .  VIAGRA 100 MG tablet, Take 1 tablet (100 mg total) by mouth daily. Take as needed prior to planned sexual activities, Disp: 6 tablet, Rfl: 1 .  amLODipine (NORVASC) 10 MG tablet, TAKE (1) TABLET BY MOUTH DAILY FOR HIGH BLOOD PRESSURE., Disp: 90 tablet, Rfl: 3  Patient Active Problem List   Diagnosis Date Noted  . OSA (obstructive sleep apnea) 06/04/2018  . Tubular adenoma of colon   . Low HDL (under 40) 02/13/2017  . Tobacco abuse 05/16/2016  . High triglycerides 05/16/2016  . COPD (chronic obstructive pulmonary disease) ([place]) 02/10/2015  . Lumbar radiculopathy 01/10/2015  . Left hip pain 01/10/2015  . Abnormal presence of protein in urine 10/06/2013  . Vitamin D deficiency 10/11/2011  . Diabetes mellitus ([place]) 10/11/2011  . Chronic kidney disease (CKD), stage III (moderate) ([place]) 08/15/2010  . Convulsions, epileptic ([place]) 08/15/2010  . BP (high blood pressure) 08/15/2010  . Epilepsy ([place]) 08/15/2010    Past Surgical History:  Procedure Laterality Date  . COLONOSCOPY WITH PROPOFOL N/A 02/04/2018   Procedure: COLONOSCOPY WITH PROPOFOL;  Surgeon: Lin Landsman, MD;  Location[facility] ENDOSCOPY;  Service: Gastroenterology;  Laterality:  N/A;  . HAND SURGERY Left   . KNEE SURGERY Left     Family History  Problem Relation Age of Onset  . Clotting disorder Mother   . Diabetes Mother   . Hypertension Mother   . Heart attack Father   . Hypertension Sister   . Hypertension Brother   . Vision loss Brother     Social History   Tobacco Use  . Smoking status: Current Every Day Smoker    Packs/day: 1.00    Years: 41.00    Pack years: 41.00  . Smokeless tobacco: Never Used  Vaping Use  . Vaping Use: Never used  Substance Use Topics  . Alcohol use: No    Alcohol/week: 0.0 standard drinks  . Drug use: No     No Known Allergies  Health Maintenance  Topic Date Due  . HIV Screening  Never done  . COVID-19 Vaccine (2 - Moderna 3-dose series) 04/05/2020  . TETANUS/TDAP  05/20/2021 (Originally 02/04/2020)  . OPHTHALMOLOGY EXAM  06/10/2020  . FOOT EXAM  06/29/2020  .  HEMOGLOBIN A1C  09/26/2020  . COLONOSCOPY (Pts 45-12yr Insurance coverage will need to be confirmed)  02/05/2028  . INFLUENZA VACCINE  Completed  . PNEUMOCOCCAL POLYSACCHARIDE VACCINE AGE 21-64 HIGH RISK  Completed  . Hepatitis C Screening  Completed    Chart Review Today: I have reviewed the patient's medical history in detail and updated the computerized patient record.   Review of Systems  Constitutional: Negative.   HENT: Negative.   Eyes: Negative.   Respiratory: Negative.   Cardiovascular: Negative.   Gastrointestinal: Negative.   Endocrine: Negative.   Genitourinary: Negative.   Musculoskeletal: Negative.   Skin: Negative.   Allergic/Immunologic: Negative.   Neurological: Negative.   Hematological: Negative.   Psychiatric/Behavioral: Negative.   All other systems reviewed and are negative.    Objective:   Vitals:   05/20/20 1109  BP: 128/82  Pulse: 84  Resp: 18  Temp: 98 F (36.7 C)  SpO2: 94%  Weight: 192 lb 3.2 oz (87.2 kg)  Height: '5\' 8"'  (1.727 m)    Body mass index is 29.22 kg/m.  Physical Exam Vitals and  nursing note reviewed.  Constitutional:      General: He is not in acute distress.    Appearance: Normal appearance. He is well-developed, well-groomed and overweight. He is not ill-appearing, toxic-appearing or diaphoretic.     Interventions: Face mask in place.  HENT:     Head: Normocephalic and atraumatic.     Jaw: No trismus.     Right Ear: External ear normal.     Left Ear: External ear normal.  Eyes:     General: Lids are normal. No scleral icterus.       Right eye: No discharge.        Left eye: No discharge.     Conjunctiva/sclera: Conjunctivae normal.  Neck:     Trachea: Trachea and phonation normal. No tracheal deviation.  Cardiovascular:     Rate and Rhythm: Normal rate and regular rhythm.     Pulses: Normal pulses.          Radial pulses are 2+ on the right side and 2+ on the left side.       Posterior tibial pulses are 2+ on the right side and 2+ on the left side.     Heart sounds: Normal heart sounds. No murmur heard. No friction rub. No gallop.   Pulmonary:     Effort: Pulmonary effort is normal. No respiratory distress.     Breath sounds: Normal breath sounds. No stridor. No wheezing, rhonchi or rales.  Abdominal:     General: Bowel sounds are normal. There is no distension.     Palpations: Abdomen is soft.  Musculoskeletal:     Right lower leg: No edema.     Left lower leg: No edema.  Skin:    General: Skin is warm and dry.     Capillary Refill: Capillary refill takes less than 2 seconds.     Coloration: Skin is not jaundiced.     Findings: No rash.     Nails: There is no clubbing.  Neurological:     Mental Status: He is alert. Mental status is at baseline.     Cranial Nerves: No dysarthria or facial asymmetry.     Motor: No tremor or abnormal muscle tone.     Gait: Gait normal.  Psychiatric:        Mood and Affect: Mood normal.        Speech: Speech normal.  Behavior: Behavior normal. Behavior is cooperative.         Assessment & Plan:    1. Essential hypertension Stable, well controlled, BP at goal today Continue metoprolol 100 mg, norvasc 10, losartan hctz 100-12.5 Continue meds and healthy diet/lifestyle  2. Stage 3 chronic kidney disease, unspecified whether stage 3a or 3b CKD ([place]) monitoring  3. Mixed hyperlipidemia Compliant with statin  - atorvastatin (LIPITOR) 20 MG tablet; Take 1 tablet (20 mg total) by mouth at bedtime.  Dispense: 90 tablet; Refill: 3  4. Type 2 diabetes mellitus with stage 3b chronic kidney disease, with long-term current use of insulin (HCC) A1C 5.6 - well controlled - Dulaglutide 0.75 MG/0.5ML SOPN; Inject 0.5 mg into the skin once a week. - pioglitazone (ACTOS) 15 MG tablet; Take 15 mg by mouth daily. - atorvastatin (LIPITOR) 20 MG tablet; Take 1 tablet (20 mg total) by mouth at bedtime.  Dispense: 90 tablet; Refill: 3  5. High triglycerides Last triglycerides high- will need fasting labs or direct LDL   6. Erectile dysfunction, unspecified erectile dysfunction type Reviewed med SE - refills given - sildenafil (VIAGRA) 100 MG tablet; Take 1 tablet (100 mg total) by mouth daily. Take as needed prior to planned sexual activities  Dispense: 6 tablet; Refill: 5  7. Chronic bronchitis, unspecified chronic bronchitis type ([place]) Worsening respiratory sx, nebs at home may be helpful, close f/up if sx do not improve  - Tiotropium Bromide-Olodaterol (STIOLTO RESPIMAT) 2.5-2.5 MCG/ACT AERS; INHALE 2 PUFFS BY MOUTH ONCE DAILY  Dispense: 4 g; Refill: 5 - albuterol (VENTOLIN HFA) 108 (90 Base) MCG/ACT inhaler; INHALE 1-2 PUFFS BY MOUTH EVERY 6 HOURS AS NEEDED FOR WHEEZING OR SHORTNESS OF BREATH  Dispense: 18 g; Refill: 2 - Respiratory Therapy Supplies (NEBULIZER/TUBING/MOUTHPIECE) KIT; Disp one nebulizer machine, tubing set and mouthpiece kit  Dispense: 1 kit; Refill: 0 - ipratropium-albuterol (DUONEB) 0.5-2.5 (3) MG/3ML SOLN; Take 3 mLs by nebulization 3 (three) times daily as needed.  Dispense:  180 mL; Refill: 1  8. Uncomplicated asthma, unspecified asthma severity, unspecified whether persistent See above - Respiratory Therapy Supplies (NEBULIZER/TUBING/MOUTHPIECE) KIT; Disp one nebulizer machine, tubing set and mouthpiece kit  Dispense: 1 kit; Refill: 0 - ipratropium-albuterol (DUONEB) 0.5-2.5 (3) MG/3ML SOLN; Take 3 mLs by nebulization 3 (three) times daily as needed.  Dispense: 180 mL; Refill: 1     Return for 3-4 month CPE (f/up on asthma) .   Delsa Grana, PA-C 05/20/20 11:31 AM

## 2020-07-08 ENCOUNTER — Other Ambulatory Visit: Payer: Self-pay | Admitting: Family Medicine

## 2020-07-08 DIAGNOSIS — J42 Unspecified chronic bronchitis: Secondary | ICD-10-CM

## 2020-07-25 ENCOUNTER — Other Ambulatory Visit: Payer: Self-pay | Admitting: Family Medicine

## 2020-07-25 DIAGNOSIS — J45909 Unspecified asthma, uncomplicated: Secondary | ICD-10-CM

## 2020-07-25 DIAGNOSIS — J42 Unspecified chronic bronchitis: Secondary | ICD-10-CM

## 2020-08-25 ENCOUNTER — Encounter: Payer: BC Managed Care – PPO | Admitting: Family Medicine

## 2020-08-26 LAB — HEMOGLOBIN A1C: Hemoglobin A1C: 6.4

## 2020-08-31 ENCOUNTER — Other Ambulatory Visit: Payer: Self-pay | Admitting: Family Medicine

## 2020-08-31 DIAGNOSIS — J42 Unspecified chronic bronchitis: Secondary | ICD-10-CM

## 2020-08-31 NOTE — Telephone Encounter (Signed)
Pt has an appt on 09/01/20 with Malachy Mood

## 2020-08-31 NOTE — Telephone Encounter (Signed)
   Notes to clinic:  Patient has appt tomorrow Refill has been requested early  Last filled on 08/12/2020   Requested Prescriptions  Pending Prescriptions Disp Refills   albuterol (VENTOLIN HFA) 108 (90 Base) MCG/ACT inhaler [Pharmacy Med Name: ALBUTEROL SUL HFA 90 MCG INH] 18 g 0    Sig: INHALE 1-2 PUFFS BY MOUTH EVERY 6 HOURS AS NEEDED FOR WHEEZING OR SHORTNESS OF BREATH      Pulmonology:  Beta Agonists Failed - 08/31/2020  9:45 AM      Failed - One inhaler should last at least one month. If the patient is requesting refills earlier, contact the patient to check for uncontrolled symptoms.      Passed - Valid encounter within last 12 months    Recent Outpatient Visits           3 months ago Essential hypertension   [facility] Delsa Grana, PA-C   6 months ago Essential hypertension   [place], MD   1 year ago Essential hypertension   [place], MD   1 year ago Chronic bronchitis, unspecified chronic bronchitis type [facility])   [place], FNP   1 year ago Stage 3 chronic kidney disease, unspecified whether stage 3a or 3b CKD   [place], FNP       Future Appointments             Tomorrow Kathrine Haddock, NP [facility], [facility]

## 2020-09-01 ENCOUNTER — Ambulatory Visit: Payer: BC Managed Care – PPO | Admitting: Unknown Physician Specialty

## 2020-09-01 ENCOUNTER — Encounter: Payer: Self-pay | Admitting: Unknown Physician Specialty

## 2020-09-01 ENCOUNTER — Other Ambulatory Visit: Payer: Self-pay

## 2020-09-01 VITALS — BP 124/76 | HR 83 | Temp 98.1°F | Resp 18 | Ht 68.0 in | Wt 188.2 lb

## 2020-09-01 DIAGNOSIS — Z0000 Encounter for general adult medical examination without abnormal findings: Secondary | ICD-10-CM | POA: Diagnosis not present

## 2020-09-01 DIAGNOSIS — N529 Male erectile dysfunction, unspecified: Secondary | ICD-10-CM

## 2020-09-01 DIAGNOSIS — Z125 Encounter for screening for malignant neoplasm of prostate: Secondary | ICD-10-CM | POA: Diagnosis not present

## 2020-09-01 DIAGNOSIS — E1122 Type 2 diabetes mellitus with diabetic chronic kidney disease: Secondary | ICD-10-CM

## 2020-09-01 DIAGNOSIS — I10 Essential (primary) hypertension: Secondary | ICD-10-CM | POA: Diagnosis not present

## 2020-09-01 DIAGNOSIS — Z23 Encounter for immunization: Secondary | ICD-10-CM

## 2020-09-01 DIAGNOSIS — Z720 Tobacco use: Secondary | ICD-10-CM

## 2020-09-01 DIAGNOSIS — E782 Mixed hyperlipidemia: Secondary | ICD-10-CM | POA: Diagnosis not present

## 2020-09-01 DIAGNOSIS — J42 Unspecified chronic bronchitis: Secondary | ICD-10-CM

## 2020-09-01 DIAGNOSIS — N1832 Chronic kidney disease, stage 3b: Secondary | ICD-10-CM

## 2020-09-01 DIAGNOSIS — Z794 Long term (current) use of insulin: Secondary | ICD-10-CM

## 2020-09-01 LAB — LIPID PANEL
Cholesterol: 152 mg/dL (ref <200)
HDL: 32 mg/dL — ABNORMAL LOW (ref >=40)
LDL Cholesterol (Calc): 83 mg/dL (calc)
Non-HDL Cholesterol (Calc): 120 mg/dL (calc) (ref <130)
Total CHOL/HDL Ratio: 4.8 (calc) (ref <5.0)
Triglycerides: 305 mg/dL — ABNORMAL HIGH (ref <150)

## 2020-09-01 MED ORDER — METOPROLOL TARTRATE 100 MG PO TABS: 100.0000 mg | ORAL_TABLET | Freq: Two times a day (BID) | ORAL | 3 refills | Status: DC

## 2020-09-01 MED ORDER — ALBUTEROL SULFATE HFA 108 (90 BASE) MCG/ACT IN AERS: 2.0000 | INHALATION_SPRAY | Freq: Four times a day (QID) | RESPIRATORY_TRACT | 0 refills | Status: DC | PRN

## 2020-09-01 MED ORDER — ATORVASTATIN CALCIUM 20 MG PO TABS: 20.0000 mg | ORAL_TABLET | Freq: Every day | ORAL | 3 refills | Status: DC

## 2020-09-01 MED ORDER — LOSARTAN POTASSIUM-HCTZ 100-12.5 MG PO TABS: 1.0000 | ORAL_TABLET | Freq: Every day | ORAL | 1 refills | Status: DC

## 2020-09-01 MED ORDER — AMLODIPINE BESYLATE 10 MG PO TABS: 10.0000 mg | ORAL_TABLET | Freq: Every day | ORAL | 3 refills | Status: DC

## 2020-09-01 MED ORDER — SILDENAFIL CITRATE 100 MG PO TABS: ORAL_TABLET | ORAL | 5 refills | Status: DC

## 2020-09-01 NOTE — Assessment & Plan Note (Addendum)
Not interested in quitting at this time.  Low dose CT up to date

## 2020-09-01 NOTE — Assessment & Plan Note (Signed)
On Statin.  Check lipid panel today

## 2020-09-01 NOTE — Assessment & Plan Note (Signed)
Stable, continue present medications. Also seeing nephrology with labs up to date

## 2020-09-01 NOTE — Assessment & Plan Note (Signed)
Using Albuterol 3 times/day plus nebulizer.  Refer to pharmacy for more optimal control of COPD

## 2020-09-01 NOTE — Progress Notes (Signed)
BP 124/76   Pulse 83   Temp 98.1 F (36.7 C) (Oral)   Resp 18   Ht 5\' 8"  (1.727 m)   Wt 188 lb 3.2 oz (85.4 kg)   SpO2 98%   BMI 28.62 kg/m    Subjective:    Patient ID: Zachary Wallace, male    DOB: 1955/11/10, 65 y.o.   MRN: [id number]  HPI: Zachary Wallace is a 65 y.o. male  Chief Complaint  Patient presents with   Annual Exam   Diabetes: Currently sees Endocrinology.  Last Hgb A1C was 6.9  Hypertension  Seeing nephrology - Last GFR was 34 Using medications without difficulty Average home BPs   Using medication without problems or lightheadedness No chest pain with exertion or shortness of breath No Edema  Elevated Cholesterol Using medications without problems No Muscle aches  Diet: Exercise:  COPD Uses inhaler 3x/da and duoneb twice a day.  Uses Stiolto for maintenance.  Smoking 1pack every 3 days.  Not ready to quit at this time.    ED Stable on Sildenafil  Depression screen [facility] 2/9 09/01/2020 09/01/2020 05/20/2020 02/29/2020 08/26/2019  Decreased Interest 0 0 0 0 0  Down, Depressed, Hopeless 0 0 0 0 0  PHQ - 2 Score 0 0 0 0 0  Altered sleeping - - 0 - 1  Tired, decreased energy - - 0 - 0  Change in appetite - - 0 - 0  Feeling bad or failure about yourself  - - 0 - 0  Trouble concentrating - - 0 - 0  Moving slowly or fidgety/restless - - 0 - 0  Suicidal thoughts - - 0 - 0  PHQ-9 Score - - 0 - 1  Difficult doing work/chores - - Not difficult at all - Not difficult at all  Some recent data might be hidden     Social History   Socioeconomic History   Marital status: Married    Spouse name: Not on file   Number of children: Not on file   Years of education: Not on file   Highest education level: Not on file  Occupational History   Occupation: retires custodian [employer]  Tobacco Use   Smoking status: Every Day    Packs/day: 1.00    Years: 41.00    Pack years: 41.00    Types: Cigarettes   Smokeless tobacco: Never  Vaping Use   Vaping  Use: Never used  Substance and Sexual Activity   Alcohol use: No    Alcohol/week: 0.0 standard drinks   Drug use: No   Sexual activity: Yes    Partners: Male, Male  Other Topics Concern   Not on file  Social History Narrative   Not on file   Social Determinants of Health   Financial Resource Strain: Low Risk    Difficulty of Paying Living Expenses: Not hard at all  Food Insecurity: No Food Insecurity   Worried About Charity fundraiser in the Last Year: Never true   Ran Out of Food in the Last Year: Never true  Transportation Needs: No Transportation Needs   Lack of Transportation (Medical): No   Lack of Transportation (Non-Medical): No  Physical Activity: Insufficiently Active   Days of Exercise per Week: 3 days   Minutes of Exercise per Session: 30 min  Stress: No Stress Concern Present   Feeling of Stress : Only a little  Social Connections: Engineer, building services of Communication with Friends and  Family: More than three times a week   Frequency of Social Gatherings with Friends and Family: More than three times a week   Attends Religious Services: More than 4 times per year   Active Member of [employer] or Organizations: Yes   Attends Music therapist: More than 4 times per year   Marital Status: Married  Human resources officer Violence: Not At Risk   Fear of Current or Ex-Partner: No   Emotionally Abused: No   Physically Abused: No   Sexually Abused: No   Family History  Problem Relation Age of Onset   Clotting disorder Mother    Diabetes Mother    Hypertension Mother    Heart attack Father    Hypertension Sister    Hypertension Brother    Vision loss Brother    Past Medical History:  Diagnosis Date   COPD (chronic obstructive pulmonary disease) ([place])    Diabetes mellitus without complication ([place])    Hyperlipidemia    Hypertension    OSA (obstructive sleep apnea)    Past Surgical History:  Procedure Laterality Date   COLONOSCOPY WITH  PROPOFOL N/A 02/04/2018   Procedure: COLONOSCOPY WITH PROPOFOL;  Surgeon: Lin Landsman, MD;  Location: [facility] ENDOSCOPY;  Service: Gastroenterology;  Laterality: N/A;   HAND SURGERY Left    KNEE SURGERY Left       Relevant past medical, surgical, family and social history reviewed and updated as indicated. Interim medical history since our last visit reviewed. Allergies and medications reviewed and updated.  Review of Systems  Constitutional: Negative.   HENT: Negative.    Eyes: Negative.   Cardiovascular: Negative.   Gastrointestinal: Negative.   Genitourinary: Negative.   Musculoskeletal: Negative.   Skin: Negative.    Per HPI unless specifically indicated above     Objective:    BP 124/76   Pulse 83   Temp 98.1 F (36.7 C) (Oral)   Resp 18   Ht 5\' 8"  (1.727 m)   Wt 188 lb 3.2 oz (85.4 kg)   SpO2 98%   BMI 28.62 kg/m   Wt Readings from Last 3 Encounters:  09/01/20 188 lb 3.2 oz (85.4 kg)  05/20/20 192 lb 3.2 oz (87.2 kg)  02/29/20 195 lb 8 oz (88.7 kg)    Physical Exam Constitutional:      Appearance: He is well-developed.  HENT:     Head: Normocephalic.     Right Ear: Tympanic membrane, ear canal and external ear normal.     Left Ear: Tympanic membrane, ear canal and external ear normal.     Mouth/Throat:     Pharynx: Uvula midline.  Eyes:     Pupils: Pupils are equal, round, and reactive to light.  Cardiovascular:     Rate and Rhythm: Normal rate and regular rhythm.     Heart sounds: Normal heart sounds. No murmur heard.   No friction rub. No gallop.  Pulmonary:     Effort: Pulmonary effort is normal. No respiratory distress.     Breath sounds: Normal breath sounds.  Abdominal:     General: Bowel sounds are normal. There is no distension.     Palpations: Abdomen is soft.     Tenderness: There is no abdominal tenderness.  Genitourinary:    Penis: Normal.      Prostate: Normal.  Musculoskeletal:        General: Normal range of motion.   Skin:    General: Skin is warm and dry.  Neurological:  Mental Status: He is alert and oriented to person, place, and time.     Deep Tendon Reflexes: Reflexes are normal and symmetric.  Psychiatric:        Behavior: Behavior normal.        Thought Content: Thought content normal.        Judgment: Judgment normal.    Results for orders placed or performed in visit on 05/20/20  Hemoglobin A1c  Result Value Ref Range   Hemoglobin A1C 5.6   HM DIABETES FOOT EXAM  Result Value Ref Range   HM Diabetic Foot Exam normal       Assessment & Plan:   Problem List Items Addressed This Visit       Unprioritized   COPD (chronic obstructive pulmonary disease) (HCC) (Chronic)    Using Albuterol 3 times/day plus nebulizer.  Refer to pharmacy for more optimal control of COPD       Relevant Medications   albuterol (VENTOLIN HFA) 108 (90 Base) MCG/ACT inhaler   Other Relevant Orders   AMB Referral to Community Care Coordinaton   Diabetes mellitus ([place]) (Chronic)    Good Hgb A1C/  Following with Endocrinology       Relevant Medications   atorvastatin (LIPITOR) 20 MG tablet   losartan-hydrochlorothiazide (HYZAAR) 100-12.5 MG tablet   Essential hypertension    Stable, continue present medications. Also seeing nephrology with labs up to date       Relevant Medications   atorvastatin (LIPITOR) 20 MG tablet   metoprolol tartrate (LOPRESSOR) 100 MG tablet   amLODipine (NORVASC) 10 MG tablet   losartan-hydrochlorothiazide (HYZAAR) 100-12.5 MG tablet   sildenafil (VIAGRA) 100 MG tablet   Mixed hyperlipidemia    On Statin.  Check lipid panel today       Relevant Medications   atorvastatin (LIPITOR) 20 MG tablet   metoprolol tartrate (LOPRESSOR) 100 MG tablet   amLODipine (NORVASC) 10 MG tablet   losartan-hydrochlorothiazide (HYZAAR) 100-12.5 MG tablet   sildenafil (VIAGRA) 100 MG tablet   Other Relevant Orders   Lipid panel   Tobacco abuse    Not interested in quitting at  this time.  Low dose CT up to date       Other Visit Diagnoses     Routine general medical examination at a health care facility    -  Primary   Erectile dysfunction, unspecified erectile dysfunction type       Relevant Medications   sildenafil (VIAGRA) 100 MG tablet   Prostate cancer screening       Relevant Orders   PSA   Need for pneumococcal vaccine       Relevant Orders   Pneumococcal conjugate vaccine 20-valent (Prevnar 20) (Completed)       HM:  Will check with drug store for shingles vacc  Follow up plan: Return in about 6 months (around 03/03/2021).

## 2020-09-01 NOTE — Assessment & Plan Note (Signed)
Good Hgb A1C/  Following with Endocrinology

## 2020-09-19 ENCOUNTER — Telehealth: Payer: Self-pay | Admitting: *Deleted

## 2020-09-19 NOTE — Chronic Care Management (AMB) (Signed)
  Chronic Care Management   Note  09/19/2020 Name: Zachary Wallace MRN: [id number] DOB: 1955/09/06  ULRIC SALZMAN is a 65 y.o. year old male who is a primary care patient of Delsa Grana, [place]. I reached out to Ileene Rubens by phone today in response to a referral sent by Mr. Suvan Stcyr Guest's PCP, Delsa Grana, PA-C.      Mr. Mcilhenny was given information about Chronic Care Management services today including:  CCM service includes personalized support from designated clinical staff supervised by his physician, including individualized plan of care and coordination with other care providers 24/7 contact phone numbers for assistance for urgent and routine care needs. Service will only be billed when office clinical staff spend 20 minutes or more in a month to coordinate care. Only one practitioner may furnish and bill the service in a calendar month. The patient may stop CCM services at any time (effective at the end of the month) by phone call to the office staff. The patient will be responsible for cost sharing (co-pay) of up to 20% of the service fee (after annual deductible is met).  Patient did not agree to enrollment in care management services and does not wish to consider at this time.  Follow up plan: Patient declines engagement by the care management team. Appropriate care team members and provider have been notified via electronic communication. The care management team is available to follow up with the patient after provider conversation with the patient regarding recommendation for care management engagement and subsequent re-referral to the care management team.   [place] [facility]

## 2020-10-10 ENCOUNTER — Other Ambulatory Visit: Payer: Self-pay | Admitting: Family Medicine

## 2020-10-10 DIAGNOSIS — J42 Unspecified chronic bronchitis: Secondary | ICD-10-CM

## 2020-10-21 ENCOUNTER — Other Ambulatory Visit: Payer: Self-pay | Admitting: Unknown Physician Specialty

## 2020-10-21 DIAGNOSIS — J42 Unspecified chronic bronchitis: Secondary | ICD-10-CM

## 2020-10-21 NOTE — Telephone Encounter (Signed)
Pt has an appt on 02/23/21

## 2020-10-21 NOTE — Telephone Encounter (Signed)
Requested medication (s) are due for refill today: no  Requested medication (s) are on the active medication list: yes  Last refill: 10/01/2020  Future visit scheduled: yes  Notes to clinic:  Failed protocol: One inhaler should last at least one month. If the patient is requesting refills earlier, contact the patient to check for uncontrolled symptoms   Requested Prescriptions  Pending Prescriptions Disp Refills   albuterol (VENTOLIN HFA) 108 (90 Base) MCG/ACT inhaler [Pharmacy Med Name: ALBUTEROL SUL HFA 90 MCG INH] 18 g 0    Sig: INHALE 2 PUFFS BY MOUTH EVERY 6 HOURS AS NEEDED FOR SHORTNESS OF BREATH OR WHEEZING.      Pulmonology:  Beta Agonists Failed - 10/21/2020 10:00 AM      Failed - One inhaler should last at least one month. If the patient is requesting refills earlier, contact the patient to check for uncontrolled symptoms.      Passed - Valid encounter within last 12 months    Recent Outpatient Visits           1 month ago Routine general medical examination at a health care facility   [place], NP   5 months ago Essential hypertension   [facility] Delsa Grana, PA-C   7 months ago Essential hypertension   [place], MD   1 year ago Essential hypertension   [place], MD   1 year ago Chronic bronchitis, unspecified chronic bronchitis type [facility]   [place], [place], [place]       Future Appointments             In 4 months Delsa Grana, PA-C [facility], [facility]

## 2020-11-14 ENCOUNTER — Other Ambulatory Visit: Payer: Self-pay | Admitting: Unknown Physician Specialty

## 2020-11-14 DIAGNOSIS — N529 Male erectile dysfunction, unspecified: Secondary | ICD-10-CM

## 2020-11-17 ENCOUNTER — Other Ambulatory Visit: Payer: Self-pay | Admitting: Family Medicine

## 2020-11-17 DIAGNOSIS — J42 Unspecified chronic bronchitis: Secondary | ICD-10-CM

## 2020-11-25 ENCOUNTER — Other Ambulatory Visit: Payer: Self-pay | Admitting: Family Medicine

## 2020-11-25 DIAGNOSIS — J42 Unspecified chronic bronchitis: Secondary | ICD-10-CM

## 2020-11-30 ENCOUNTER — Other Ambulatory Visit: Payer: Self-pay | Admitting: Family Medicine

## 2020-11-30 DIAGNOSIS — J42 Unspecified chronic bronchitis: Secondary | ICD-10-CM

## 2020-12-05 ENCOUNTER — Other Ambulatory Visit: Payer: Self-pay | Admitting: Family Medicine

## 2020-12-05 DIAGNOSIS — J42 Unspecified chronic bronchitis: Secondary | ICD-10-CM

## 2020-12-06 NOTE — Telephone Encounter (Signed)
Pt called stating that he is almost of this prescription. He is requesting to have this sent in for him. Please advise.

## 2020-12-06 NOTE — Telephone Encounter (Signed)
Pt is already needing refill, he states is having to use almost everyday for sob?  Please advise

## 2020-12-08 MED ORDER — ALBUTEROL SULFATE HFA 108 (90 BASE) MCG/ACT IN AERS: INHALATION_SPRAY | RESPIRATORY_TRACT | 0 refills | Status: DC

## 2020-12-23 LAB — HM DIABETES EYE EXAM

## 2020-12-26 ENCOUNTER — Other Ambulatory Visit: Payer: Self-pay | Admitting: Unknown Physician Specialty

## 2020-12-26 DIAGNOSIS — I10 Essential (primary) hypertension: Secondary | ICD-10-CM

## 2020-12-27 NOTE — Telephone Encounter (Signed)
Pts next appt is 02/23/21

## 2020-12-27 NOTE — Telephone Encounter (Signed)
Requested medications are due for refill today yes  Requested medications are on the active medication list yes  Last refill 10/01/20  Last visit 09/01/20  Future visit scheduled 02/23/21  Notes to clinic failed protocol due to labs are more than 180 days ago, please assess.

## 2020-12-29 ENCOUNTER — Encounter: Payer: Self-pay | Admitting: Family Medicine

## 2021-01-16 ENCOUNTER — Other Ambulatory Visit: Payer: Self-pay | Admitting: Unknown Physician Specialty

## 2021-01-16 DIAGNOSIS — J42 Unspecified chronic bronchitis: Secondary | ICD-10-CM

## 2021-01-16 NOTE — Telephone Encounter (Signed)
Requested Prescriptions  Pending Prescriptions Disp Refills  . albuterol (VENTOLIN HFA) 108 (90 Base) MCG/ACT inhaler [Pharmacy Med Name: ALBUTEROL SUL HFA 90 MCG INH] 18 g 8    Sig: INHALE 2 PUFFS BY MOUTH EVERY 6 HOURS AS NEEDED FOR SHORTNESS OF BREATH OR WHEEZING.     Pulmonology:  Beta Agonists Failed - 01/16/2021  3:15 PM      Failed - One inhaler should last at least one month. If the patient is requesting refills earlier, contact the patient to check for uncontrolled symptoms.      Passed - Valid encounter within last 12 months    Recent Outpatient Visits          4 months ago Routine general medical examination at a health care facility   [place], NP   8 months ago Essential hypertension   [place], PA-C   10 months ago Essential hypertension   [place], MD   1 year ago Essential hypertension   [place], MD   1 year ago Chronic bronchitis, unspecified chronic bronchitis type [facility])   [place], [place], [place]      Future Appointments            In 1 month Delsa Grana, PA-C [facility], [facility]

## 2021-02-18 ENCOUNTER — Other Ambulatory Visit: Payer: Self-pay | Admitting: Unknown Physician Specialty

## 2021-02-18 DIAGNOSIS — N529 Male erectile dysfunction, unspecified: Secondary | ICD-10-CM

## 2021-02-19 NOTE — Telephone Encounter (Signed)
Requested Prescriptions  Pending Prescriptions Disp Refills  . sildenafil (VIAGRA) 100 MG tablet [Pharmacy Med Name: SILDENAFIL 100 MG TABLET] 6 tablet 0    Sig: Take 1 tablet (100 mg total) by mouth daily. Take as needed prior to planned sexual activities     Urology: Erectile Dysfunction Agents Passed - 02/18/2021  9:30 AM      Passed - Last BP in normal range    BP Readings from Last 1 Encounters:  09/01/20 124/76         Passed - Valid encounter within last 12 months    Recent Outpatient Visits          5 months ago Routine general medical examination at a health care facility   [place], NP   9 months ago Essential hypertension   [place], [place]   11 months ago Essential hypertension   [place], MD   1 year ago Essential hypertension   [facility] Lebron Conners D, MD   1 year ago Chronic bronchitis, unspecified chronic bronchitis type [facility])   [place], [place], [place]      Future Appointments            In 4 days Delsa Grana, PA-C [facility], [facility]

## 2021-02-23 ENCOUNTER — Ambulatory Visit: Payer: BC Managed Care – PPO | Admitting: Internal Medicine

## 2021-02-23 ENCOUNTER — Encounter: Payer: Self-pay | Admitting: Internal Medicine

## 2021-02-23 VITALS — BP 122/74 | HR 80 | Temp 97.8°F | Resp 16 | Ht 68.0 in | Wt 190.1 lb

## 2021-02-23 DIAGNOSIS — E1122 Type 2 diabetes mellitus with diabetic chronic kidney disease: Secondary | ICD-10-CM | POA: Diagnosis not present

## 2021-02-23 DIAGNOSIS — Z720 Tobacco use: Secondary | ICD-10-CM

## 2021-02-23 DIAGNOSIS — N1832 Chronic kidney disease, stage 3b: Secondary | ICD-10-CM

## 2021-02-23 DIAGNOSIS — Z122 Encounter for screening for malignant neoplasm of respiratory organs: Secondary | ICD-10-CM

## 2021-02-23 DIAGNOSIS — J42 Unspecified chronic bronchitis: Secondary | ICD-10-CM

## 2021-02-23 DIAGNOSIS — G4733 Obstructive sleep apnea (adult) (pediatric): Secondary | ICD-10-CM

## 2021-02-23 DIAGNOSIS — N529 Male erectile dysfunction, unspecified: Secondary | ICD-10-CM

## 2021-02-23 DIAGNOSIS — Z114 Encounter for screening for human immunodeficiency virus [HIV]: Secondary | ICD-10-CM

## 2021-02-23 DIAGNOSIS — N183 Chronic kidney disease, stage 3 unspecified: Secondary | ICD-10-CM

## 2021-02-23 DIAGNOSIS — I10 Essential (primary) hypertension: Secondary | ICD-10-CM

## 2021-02-23 DIAGNOSIS — E782 Mixed hyperlipidemia: Secondary | ICD-10-CM | POA: Diagnosis not present

## 2021-02-23 DIAGNOSIS — Z794 Long term (current) use of insulin: Secondary | ICD-10-CM

## 2021-02-23 LAB — COMPLETE METABOLIC PANEL WITH GFR
AG Ratio: 1.6 (calc) (ref 1.0–2.5)
ALT: 14 U/L (ref 9–46)
AST: 20 U/L (ref 10–35)
Albumin: 4.4 g/dL (ref 3.6–5.1)
Alkaline phosphatase (APISO): 89 U/L (ref 35–144)
BUN/Creatinine Ratio: 7 (calc) (ref 6–22)
BUN: 12 mg/dL (ref 7–25)
CO2: 34 mmol/L — ABNORMAL HIGH (ref 20–32)
Calcium: 9.8 mg/dL (ref 8.6–10.3)
Chloride: 99 mmol/L (ref 98–110)
Creat: 1.68 mg/dL — ABNORMAL HIGH (ref 0.70–1.35)
Globulin: 2.8 g/dL (calc) (ref 1.9–3.7)
Glucose, Bld: 90 mg/dL (ref 65–99)
Potassium: 4.4 mmol/L (ref 3.5–5.3)
Sodium: 138 mmol/L (ref 135–146)
Total Bilirubin: 0.5 mg/dL (ref 0.2–1.2)
Total Protein: 7.2 g/dL (ref 6.1–8.1)
eGFR: 45 mL/min/{1.73_m2} — ABNORMAL LOW (ref >=60)

## 2021-02-23 LAB — CBC WITH DIFFERENTIAL/PLATELET
Absolute Monocytes: 618 cells/uL (ref 200–950)
Basophils Absolute: 30 cells/uL (ref 0–200)
Basophils Relative: 0.5 %
Eosinophils Absolute: 300 cells/uL (ref 15–500)
Eosinophils Relative: 5 %
HCT: 46.9 % (ref 38.5–50.0)
Hemoglobin: 15.5 g/dL (ref 13.2–17.1)
Lymphs Abs: 1554 cells/uL (ref 850–3900)
MCH: 29.6 pg (ref 27.0–33.0)
MCHC: 33 g/dL (ref 32.0–36.0)
MCV: 89.7 fL (ref 80.0–100.0)
MPV: 11.3 fL (ref 7.5–12.5)
Monocytes Relative: 10.3 %
Neutro Abs: 3498 cells/uL (ref 1500–7800)
Neutrophils Relative %: 58.3 %
Platelets: 193 10*3/uL (ref 140–400)
RBC: 5.23 10*6/uL (ref 4.20–5.80)
RDW: 12.3 % (ref 11.0–15.0)
Total Lymphocyte: 25.9 %
WBC: 6 10*3/uL (ref 3.8–10.8)

## 2021-02-23 MED ORDER — METOPROLOL TARTRATE 100 MG PO TABS
100.0000 mg | ORAL_TABLET | Freq: Two times a day (BID) | ORAL | 3 refills | Status: DC
Start: 2021-02-23 — End: 2021-11-17

## 2021-02-23 MED ORDER — SILDENAFIL CITRATE 100 MG PO TABS: ORAL_TABLET | ORAL | 0 refills | Status: DC

## 2021-02-23 MED ORDER — LOSARTAN POTASSIUM-HCTZ 100-12.5 MG PO TABS: 1.0000 | ORAL_TABLET | Freq: Every day | ORAL | 1 refills | Status: DC

## 2021-02-23 MED ORDER — AMLODIPINE BESYLATE 10 MG PO TABS: 10.0000 mg | ORAL_TABLET | Freq: Every day | ORAL | 3 refills | Status: DC

## 2021-02-23 MED ORDER — ATORVASTATIN CALCIUM 40 MG PO TABS: 40.0000 mg | ORAL_TABLET | Freq: Every day | ORAL | 3 refills | Status: DC

## 2021-02-23 MED ORDER — STIOLTO RESPIMAT 2.5-2.5 MCG/ACT IN AERS: INHALATION_SPRAY | RESPIRATORY_TRACT | 4 refills | Status: DC

## 2021-02-23 NOTE — Progress Notes (Signed)
Established Patient Office Visit  Subjective:  Patient ID: Zachary Wallace, male    DOB: 22-Sep-1955  Age: 65 y.o. MRN: [id number]  CC:  Chief Complaint  Patient presents with   Hyperlipidemia   Hypertension   Diabetes    See's endo    HPI Zachary Wallace presents for follow up on chronic medical conditions. Today he states he is doing well but needs refills on medications.   Diabetes, Type 2: -Last A1c 5/22: 6.9% -Follows with Endocrinology, seeing next in January  -Taking Trulicity, Tresiba 28 units a day -Eye exam: 9/22 -Foot exam: seeing Podiatrist and Endo -Statin: yes -PNA vaccine: yes -Denies symptoms of hypoglycemia, polyuria, polydipsia, numbness extremities, foot ulcers/trauma.   Hypertension/OSA: -Medications: Lopressor 100 mg, Amlodipine 10 mg, Losartan-HCTZ 100-12.5 mg -Patient is compliant with above medications and reports no side effects. -Checking BP at home (average): doesn't check daily, some times checks at drug store ranges about 130/80 -Denies any SOB, CP, vision changes, LE edema or symptoms of hypotension -Follows with Nephrology, next seeing in 1 year  -CPAP - not currently wearing CPAP couldn't tolerate    HLD: -Medications: Lipitor 20 mg -Patient is compliant with above medications and reports no side effects.  -Last lipid panel: 6/22: TC 152, HDL 32, triglycerides 305, LDL 83   COPD: -COPD status: stable -Current medications: Stiolto Respimat and Duonebs/Albuterol as needed.  -Satisfied with current treatment?: yes -Oxygen use: no -Dyspnea frequency: occasionally with exertion -Cough frequency: rare -Rescue inhaler frequency: hasn't had to use in a few months  -Wheeze: daily - had been on Trelegy in past but cause pneumonia -Limitation of activity: yes -Productive cough: no -Pneumovax: Up to Date -Influenza: Up to Date -Current smoker 8 cigarettes for > 30 years, not ready to quit  Health Maintenance: -Blood work due today -Colon cancer  screening: Colonoscopy 11/19 - repeat in 10 years -Lung cancer screening: LD CT 7/21 Lung RADS 1, reorder today  Past Medical History:  Diagnosis Date   COPD (chronic obstructive pulmonary disease) ([place])    Diabetes mellitus without complication ([place])    Hyperlipidemia    Hypertension    OSA (obstructive sleep apnea)     Past Surgical History:  Procedure Laterality Date   COLONOSCOPY WITH PROPOFOL N/A 02/04/2018   Procedure: COLONOSCOPY WITH PROPOFOL;  Surgeon: Lin Landsman, MD;  Location: [place];  Service: Gastroenterology;  Laterality: N/A;   HAND SURGERY Left    KNEE SURGERY Left     Family History  Problem Relation Age of Onset   Clotting disorder Mother    Diabetes Mother    Hypertension Mother    Heart attack Father    Hypertension Sister    Hypertension Brother    Vision loss Brother     Social History   Socioeconomic History   Marital status: Married    Spouse name: Not on file   Number of children: Not on file   Years of education: Not on file   Highest education level: Not on file  Occupational History   Occupation: retires custodian [employer]  Tobacco Use   Smoking status: Every Day    Packs/day: 1.00    Years: 41.00    Pack years: 41.00    Types: Cigarettes   Smokeless tobacco: Never  Vaping Use   Vaping Use: Never used  Substance and Sexual Activity   Alcohol use: No    Alcohol/week: 0.0 standard drinks   Drug use: No  Sexual activity: Yes    Partners: Male, Male  Other Topics Concern   Not on file  Social History Narrative   Not on file   Social Determinants of Health   Financial Resource Strain: Low Risk    Difficulty of Paying Living Expenses: Not hard at all  Food Insecurity: No Food Insecurity   Worried About Charity fundraiser in the Last Year: Never true   [place] in the Last Year: Never true  Transportation Needs: No Transportation Needs   Lack of Transportation (Medical): No   Lack of  Transportation (Non-Medical): No  Physical Activity: Insufficiently Active   Days of Exercise per Week: 3 days   Minutes of Exercise per Session: 30 min  Stress: No Stress Concern Present   Feeling of Stress : Only a little  Social Connections: Engineer, building services of Communication with Friends and Family: More than three times a week   Frequency of Social Gatherings with Friends and Family: More than three times a week   Attends Religious Services: More than 4 times per year   Active Member of [employer] or Organizations: Yes   Attends Music therapist: More than 4 times per year   Marital Status: Married  Human resources officer Violence: Not At Risk   Fear of Current or Ex-Partner: No   Emotionally Abused: No   Physically Abused: No   Sexually Abused: No    Outpatient Medications Prior to Visit  Medication Sig Dispense Refill   albuterol (VENTOLIN HFA) 108 (90 Base) MCG/ACT inhaler INHALE 2 PUFFS BY MOUTH EVERY 6 HOURS AS NEEDED FOR SHORTNESS OF BREATH OR WHEEZING. 18 g 8   amLODipine (NORVASC) 10 MG tablet Take 1 tablet (10 mg total) by mouth daily. 90 tablet 3   aspirin 81 MG tablet Take 81 mg by mouth.     atorvastatin (LIPITOR) 20 MG tablet Take 1 tablet (20 mg total) by mouth at bedtime. 90 tablet 3   B-D UF III MINI PEN NEEDLES 31G X 5 MM MISC      cyclobenzaprine (FLEXERIL) 10 MG tablet Take 10 mg by mouth as needed.     Dulaglutide 0.75 MG/0.5ML SOPN Inject 0.5 mg into the skin once a week.     glucose blood (ONETOUCH VERIO) test strip      insulin degludec (TRESIBA) 100 UNIT/ML FlexTouch Pen Inject into the skin.     ipratropium-albuterol (DUONEB) 0.5-2.5 (3) MG/3ML SOLN USE 1 VIAL IN NEBULIZER 3 TIMES A DAY AS NEEDED. 180 mL 0   losartan-hydrochlorothiazide (HYZAAR) 100-12.5 MG tablet Take 1 tablet by mouth daily. 90 tablet 1   metoprolol tartrate (LOPRESSOR) 100 MG tablet Take 1 tablet (100 mg total) by mouth 2 (two) times daily. 180 tablet 3   pioglitazone  (ACTOS) 15 MG tablet Take 15 mg by mouth daily.     Respiratory Therapy Supplies (NEBULIZER/TUBING/MOUTHPIECE) KIT Disp one nebulizer machine, tubing set and mouthpiece kit 1 kit 0   sildenafil (VIAGRA) 100 MG tablet Take 1 tablet (100 mg total) by mouth daily. Take as needed prior to planned sexual activities 6 tablet 0   STIOLTO RESPIMAT 2.5-2.5 MCG/ACT AERS INHALE 2 PUFFS BY MOUTH ONCE DAILY 4 g 4   No facility-administered medications prior to visit.    No Known Allergies  ROS Review of Systems  Constitutional:  Negative for chills and fever.  Eyes:  Negative for visual disturbance.  Respiratory:  Positive for wheezing. Negative for cough and  shortness of breath.   Cardiovascular:  Negative for chest pain.  Gastrointestinal:  Negative for abdominal pain, nausea and vomiting.  Skin: Negative.   Neurological:  Negative for dizziness and headaches.     Objective:    Physical Exam Constitutional:      Appearance: Normal appearance.  HENT:     Head: Normocephalic and atraumatic.  Eyes:     Conjunctiva/sclera: Conjunctivae normal.  Cardiovascular:     Rate and Rhythm: Normal rate and regular rhythm.  Pulmonary:     Effort: Pulmonary effort is normal.     Breath sounds: Normal breath sounds. No wheezing, rhonchi or rales.  Abdominal:     General: There is no distension.     Palpations: Abdomen is soft.     Tenderness: There is no abdominal tenderness.  Musculoskeletal:     Right lower leg: No edema.     Left lower leg: No edema.  Skin:    General: Skin is warm and dry.  Neurological:     General: No focal deficit present.     Mental Status: He is alert. Mental status is at baseline.  Psychiatric:        Mood and Affect: Mood normal.        Behavior: Behavior normal.    BP 122/74   Pulse 80   Temp 97.8 F (36.6 C)   Resp 16   Ht '5\' 8"'  (1.727 m)   Wt 190 lb 1.6 oz (86.2 kg)   SpO2 96%   BMI 28.90 kg/m  Wt Readings from Last 3 Encounters:  09/01/20 188 lb 3.2  oz (85.4 kg)  05/20/20 192 lb 3.2 oz (87.2 kg)  02/29/20 195 lb 8 oz (88.7 kg)     Health Maintenance Due  Topic Date Due   Zoster Vaccines- Shingrix (2 of 2) 01/27/2018   COVID-19 Vaccine (2 - Moderna series) 04/05/2020   FOOT EXAM  06/29/2020   HEMOGLOBIN A1C  09/26/2020   INFLUENZA VACCINE  10/24/2020    There are no preventive care reminders to display for this patient.  No results found for: TSH Lab Results  Component Value Date   WBC 5.4 12/02/2017   HGB 13.7 12/02/2017   HCT 41.1 12/02/2017   MCV 88.8 12/02/2017   PLT 178 12/02/2017   Lab Results  Component Value Date   NA 136 02/29/2020   K 4.0 02/29/2020   CO2 30 02/29/2020   GLUCOSE 109 (H) 02/29/2020   BUN 17 02/29/2020   CREATININE 2.02 (H) 02/29/2020   BILITOT 0.4 02/29/2020   ALKPHOS 99 10/05/2016   AST 18 02/29/2020   ALT 15 02/29/2020   PROT 7.3 02/29/2020   ALBUMIN 4.4 10/05/2016   CALCIUM 9.7 02/29/2020   Lab Results  Component Value Date   CHOL 152 09/01/2020   Lab Results  Component Value Date   HDL 32 (L) 09/01/2020   Lab Results  Component Value Date   LDLCALC 83 09/01/2020   Lab Results  Component Value Date   TRIG 305 (H) 09/01/2020   Lab Results  Component Value Date   CHOLHDL 4.8 09/01/2020   Lab Results  Component Value Date   HGBA1C 5.6 03/29/2020      Assessment & Plan:   1. Essential hypertension: Stable. Refill blood pressure medications today, no changes in doses. CMP and CBC due. Follow up in 6 months.   - losartan-hydrochlorothiazide (HYZAAR) 100-12.5 MG tablet; Take 1 tablet by mouth daily.  Dispense: 90 tablet; Refill:  1 - COMPLETE METABOLIC PANEL WITH GFR - amLODipine (NORVASC) 10 MG tablet; Take 1 tablet (10 mg total) by mouth daily.  Dispense: 90 tablet; Refill: 3 - metoprolol tartrate (LOPRESSOR) 100 MG tablet; Take 1 tablet (100 mg total) by mouth 2 (two) times daily.  Dispense: 180 tablet; Refill: 3 - CBC w/Diff/Platelet  2. OSA (obstructive sleep  apnea): Not currently compliant with CPAP, discussed how this could effect blood pressure and health overall.   3. Type 2 diabetes mellitus with stage 3b chronic kidney disease, with long-term current use of insulin (HCC)/Stage 3 chronic kidney disease, unspecified whether stage 3a or 3b CKD ([place]): Stable, following with Endocrinology and will see them next month. Also following with Nephrology for CKD, will recheck CMP today.   4. Mixed hyperlipidemia: Reviewed last lipid panel results with patient, LDL 83, will increase statin to 40 mg and recheck lipid panel in 6 months.   - atorvastatin (LIPITOR) 40 MG tablet; Take 1 tablet (40 mg total) by mouth daily.  Dispense: 90 tablet; Refill: 3  5. Chronic bronchitis, unspecified chronic bronchitis type ([place]): Stable, discussed side effects of Trelegy and if wheezing persists trying again over the summer. For now he is happy with current treatment, Stiolto refilled today.   - Tiotropium Bromide-Olodaterol (STIOLTO RESPIMAT) 2.5-2.5 MCG/ACT AERS; Take 2 inhalations once daily.  Dispense: 4 g; Refill: 4  6. Erectile dysfunction, unspecified erectile dysfunction type: Stable, Viagra refilled today.   - sildenafil (VIAGRA) 100 MG tablet; Take as needed.  Dispense: 30 tablet; Refill: 0  7. Screening for lung cancer/Tobacco abuse: Low dose CT ordered for updated lung cancer screening. Discussed health benefits of quitting smoking, however patient is not ready to quit at this time.   - CT CHEST LUNG CA SCREEN LOW DOSE W/O CM; Future  Follow-up: Return in about 6 months (around 08/24/2021).    Teodora Medici, DO

## 2021-02-23 NOTE — Patient Instructions (Addendum)
It was great seeing you today!  Plan discussed at today's visit: -Blood work ordered today, we will call you with results -Medications refilled today, increase in Lipitor to 40 mg daily (can cut pill in half if experiencing muscle aches) -Low dose CT scan to be scheduled for lung cancer screening  Follow up in: 6 months  Take care and let us know if you have any questions or concerns prior to your next visit.  Dr. Rosana Berger

## 2021-03-02 ENCOUNTER — Ambulatory Visit: Payer: Self-pay | Admitting: *Deleted

## 2021-03-02 NOTE — Telephone Encounter (Signed)
Pt reports dry cough, onset Tuesday. Reports subjective fever, chills alternating with sweating. SOB with exertion, cannot lie flat at night. H/O COPD. States has used inhalers and nebs, "Not much help." Chest tightness. Audible wheezing during call. Advised UC. Pt states will follow disposition.

## 2021-03-02 NOTE — Telephone Encounter (Signed)
Reason for Disposition  Wheezing is present  Answer Assessment - Initial Assessment Questions 1. ONSET: "When did the cough begin?"      Tuesday 2. SEVERITY: "How bad is the cough today?"      Awake all night Tuesday, better last night 3. SPUTUM: "Describe the color of your sputum" (none, dry cough; clear, white, yellow, green)     Dry 4. HEMOPTYSIS: "Are you coughing up any blood?" If so ask: "How much?" (flecks, streaks, tablespoons, etc.)     no 5. DIFFICULTY BREATHING: "Are you having difficulty breathing?" If Yes, ask: "How bad is it?" (e.g., mild, moderate, severe)    - MILD: No SOB at rest, mild SOB with walking, speaks normally in sentences, can lie down, no retractions, pulse < 100.    - MODERATE: SOB at rest, SOB with minimal exertion and prefers to sit, cannot lie down flat, speaks in phrases, mild retractions, audible wheezing, pulse 100-120.    - SEVERE: Very SOB at rest, speaks in single words, struggling to breathe, sitting hunched forward, retractions, pulse > 120      Moderate 6. FEVER: "Do you have a fever?" If Yes, ask: "What is your temperature, how was it measured, and when did it start?"     Subjective, chills and sweating 7. CARDIAC HISTORY: "Do you have any history of heart disease?" (e.g., heart attack, congestive heart failure)      *No Answer* 8. LUNG HISTORY: "Do you have any history of lung disease?"  (e.g., pulmonary embolus, asthma, emphysema)     COPD 9. PE RISK FACTORS: "Do you have a history of blood clots?" (or: recent major surgery, recent prolonged travel, bedridden)     *No Answer* 10. OTHER SYMPTOMS: "Do you have any other symptoms?" (e.g., runny nose, wheezing, chest pain)       Wheezing, chest tightness,  Protocols used: Cough - Acute Non-Productive-A-AH

## 2021-03-29 ENCOUNTER — Telehealth: Payer: Self-pay | Admitting: Family Medicine

## 2021-03-29 DIAGNOSIS — J45909 Unspecified asthma, uncomplicated: Secondary | ICD-10-CM

## 2021-03-29 DIAGNOSIS — J42 Unspecified chronic bronchitis: Secondary | ICD-10-CM

## 2021-03-29 MED ORDER — IPRATROPIUM-ALBUTEROL 0.5-2.5 (3) MG/3ML IN SOLN: RESPIRATORY_TRACT | 0 refills | Status: DC

## 2021-03-29 NOTE — Telephone Encounter (Signed)
Pt wanted to know if this will be refilled / why it was denied, please advise.

## 2021-04-06 ENCOUNTER — Ambulatory Visit: Payer: Self-pay | Admitting: *Deleted

## 2021-04-06 NOTE — Telephone Encounter (Signed)
°  Chief Complaint: Shortness of breath with COPD Symptoms: shortness of breath Frequency: about one work but worsening Pertinent Negatives: Patient denies fever/wheezing Disposition: [] ED /[] Urgent Care (no appt availability in office) / [x] Appointment(In office/virtual)/ []  [place] [facility]/ [] Home Care/ [] Refused Recommended Disposition /[] [place] [facility]/ []  Follow-up with PCP Additional Notes: Using albuterol nebs 4 over the last few days. Appointment okay to schedule per office for 04/07/21 9:40a with Dr. Ancil Boozer.   Reason for Disposition  [1] MODERATE longstanding difficulty breathing (e.g., speaks in phrases, SOB even at rest, pulse 100-120) AND [2] SAME as normal  Answer Assessment - Initial Assessment Questions 1. RESPIRATORY STATUS: "Describe your breathing?" (e.g., wheezing, shortness of breath, unable to speak, severe coughing)  Short of breath 2. ONSET: "When did this breathing problem begin?"      About one week ago 3. PATTERN "Does the difficult breathing come and go, or has it been constant since it started?"      Gets worse with activity and lying flat at night 4. SEVERITY: "How bad is your breathing?" (e.g., mild, moderate, severe)    - MILD: No SOB at rest, mild SOB with walking, speaks normally in sentences, can lie down, no retractions, pulse < 100.    - MODERATE: SOB at rest, SOB with minimal exertion and prefers to sit, cannot lie down flat, speaks in phrases, mild retractions, audible wheezing, pulse 100-120.    - SEVERE: Very SOB at rest, speaks in single words, struggling to breathe, sitting hunched forward, retractions, pulse > 120      moderate 5. RECURRENT SYMPTOM: "Have you had difficulty breathing before?" If Yes, ask: "When was the last time?" and "What happened that time?"      Yes 6. CARDIAC HISTORY: "Do you have any history of heart disease?" (e.g., heart attack, angina, bypass surgery, angioplasty)       7. LUNG HISTORY: "Do you have any  history of lung disease?"  (e.g., pulmonary embolus, asthma, emphysema)     COPD 8. CAUSE: "What do you think is causing the breathing problem?"      COPD 9. OTHER SYMPTOMS: "Do you have any other symptoms? (e.g., dizziness, runny nose, cough, chest pain, fever)     none 10. O2 SATURATION MONITOR:  "Do you use an oxygen saturation monitor (pulse oximeter) at home?" If Yes, "What is your reading (oxygen level) today?" "What is your usual oxygen saturation reading?" (e.g., 95%)        11. PREGNANCY: "Is there any chance you are pregnant?" "When was your last menstrual period?"       na 12. TRAVEL: "Have you traveled out of the country in the last month?" (e.g., travel history, exposures)       na  Protocols used: Breathing Difficulty-A-AH

## 2021-04-07 ENCOUNTER — Ambulatory Visit: Payer: BC Managed Care – PPO | Admitting: Family Medicine

## 2021-04-07 ENCOUNTER — Encounter: Payer: Self-pay | Admitting: Family Medicine

## 2021-04-07 VITALS — BP 108/62 | HR 85 | Temp 97.8°F | Resp 16 | Ht 68.0 in | Wt 182.4 lb

## 2021-04-07 DIAGNOSIS — R0789 Other chest pain: Secondary | ICD-10-CM | POA: Diagnosis not present

## 2021-04-07 DIAGNOSIS — N1832 Chronic kidney disease, stage 3b: Secondary | ICD-10-CM

## 2021-04-07 DIAGNOSIS — J441 Chronic obstructive pulmonary disease with (acute) exacerbation: Secondary | ICD-10-CM | POA: Diagnosis not present

## 2021-04-07 DIAGNOSIS — R0602 Shortness of breath: Secondary | ICD-10-CM

## 2021-04-07 DIAGNOSIS — Z794 Long term (current) use of insulin: Secondary | ICD-10-CM

## 2021-04-07 DIAGNOSIS — E1122 Type 2 diabetes mellitus with diabetic chronic kidney disease: Secondary | ICD-10-CM

## 2021-04-07 MED ORDER — PREDNISONE 10 MG (21) PO TBPK: ORAL_TABLET | ORAL | 0 refills | Status: DC

## 2021-04-07 NOTE — Progress Notes (Signed)
Name: Zachary Wallace   MRN: [id number]    DOB: [birth year]   Date:04/07/2021       Progress Note  Subjective  Chief Complaint  COPD  HPI  SOB and chest tightness: he states similar to previous episode of COPD flare. He states harder to lay flat at night and also increase in sob with activity Chest feels congested. No lower extremity edema. He states Albuterol helps and also Stiolto. He was treated for COPD flare at [facility] about one month ago with Augmentin and prednisone taper and felt better. Symptoms re-started 5 days ago, no fever or chills. He denies rhinorrhea or nasal congestion. Appetite is good. His cough is a little worse, not wet   He has DM and dyslipidemia, he is a smoker and is down to 3-5 a day, discussed using nicotine gum instead of cigarettes. He is taking statin therapy and has a high risk of heart disease , he states not currently taking aspirin and advised him to resume taking it. Explained it could be a cardiac cause. We will try steroids but if no improvement we will refer him to cardiologist for further evaluation   The 10-year ASCVD risk score (Arnett DK, et al., 2019) is: 33.5%   Values used to calculate the score:     Age: 66 years     Sex: Male     Is Non-Hispanic African American: Yes     Diabetic: Yes     Tobacco smoker: Yes     Systolic Blood Pressure: 176 mmHg     Is BP treated: Yes     HDL Cholesterol: 32 mg/dL     Total Cholesterol: 152 mg/dL   Explained steroids can raise his glucose and to be mindful of diet over next week.   Patient Active Problem List   Diagnosis Date Noted   Mixed hyperlipidemia 05/20/2020   OSA (obstructive sleep apnea) 06/04/2018   Tubular adenoma of colon    Low HDL (under 40) 02/13/2017   Tobacco abuse 05/16/2016   High triglycerides 05/16/2016   COPD (chronic obstructive pulmonary disease) ([place]) 02/10/2015   Lumbar radiculopathy 01/10/2015   Left hip pain 01/10/2015   Abnormal presence of protein in urine 10/06/2013    Vitamin D deficiency 10/11/2011   Diabetes mellitus ([place]) 10/11/2011   Chronic kidney disease (CKD), stage III (moderate) ([place]) 08/15/2010   Essential hypertension 08/15/2010    Past Surgical History:  Procedure Laterality Date   COLONOSCOPY WITH PROPOFOL N/A 02/04/2018   Procedure: COLONOSCOPY WITH PROPOFOL;  Surgeon: Lin Landsman, MD;  Location: [place];  Service: Gastroenterology;  Laterality: N/A;   HAND SURGERY Left    KNEE SURGERY Left     Family History  Problem Relation Age of Onset   Clotting disorder Mother    Diabetes Mother    Hypertension Mother    Heart attack Father    Hypertension Sister    Hypertension Brother    Vision loss Brother     Social History   Tobacco Use   Smoking status: Every Day    Packs/day: 1.00    Years: 41.00    Pack years: 41.00    Types: Cigarettes   Smokeless tobacco: Never  Substance Use Topics   Alcohol use: No    Alcohol/week: 0.0 standard drinks     Current Outpatient Medications:    albuterol (VENTOLIN HFA) 108 (90 Base) MCG/ACT inhaler, INHALE 2 PUFFS BY MOUTH EVERY 6 HOURS AS NEEDED FOR SHORTNESS OF BREATH OR  WHEEZING., Disp: 18 g, Rfl: 8   amLODipine (NORVASC) 10 MG tablet, Take 1 tablet (10 mg total) by mouth daily., Disp: 90 tablet, Rfl: 3   aspirin 81 MG tablet, Take 81 mg by mouth., Disp: , Rfl:    atorvastatin (LIPITOR) 40 MG tablet, Take 1 tablet (40 mg total) by mouth daily., Disp: 90 tablet, Rfl: 3   B-D UF III MINI PEN NEEDLES 31G X 5 MM MISC, , Disp: , Rfl:    Dulaglutide 0.75 MG/0.5ML SOPN, Inject 0.5 mg into the skin once a week., Disp: , Rfl:    glucose blood (ONETOUCH VERIO) test strip, , Disp: , Rfl:    insulin degludec (TRESIBA) 100 UNIT/ML FlexTouch Pen, Inject into the skin., Disp: , Rfl:    ipratropium-albuterol (DUONEB) 0.5-2.5 (3) MG/3ML SOLN, USE 1 VIAL IN NEBULIZER 3 TIMES A DAY AS NEEDED., Disp: 180 mL, Rfl: 0   losartan-hydrochlorothiazide (HYZAAR) 100-12.5 MG tablet, Take 1 tablet  by mouth daily., Disp: 90 tablet, Rfl: 1   metoprolol tartrate (LOPRESSOR) 100 MG tablet, Take 1 tablet (100 mg total) by mouth 2 (two) times daily., Disp: 180 tablet, Rfl: 3   pioglitazone (ACTOS) 15 MG tablet, Take 15 mg by mouth daily., Disp: , Rfl:    Respiratory Therapy Supplies (NEBULIZER/TUBING/MOUTHPIECE) KIT, Disp one nebulizer machine, tubing set and mouthpiece kit, Disp: 1 kit, Rfl: 0   sildenafil (VIAGRA) 100 MG tablet, Take as needed., Disp: 30 tablet, Rfl: 0   Tiotropium Bromide-Olodaterol (STIOLTO RESPIMAT) 2.5-2.5 MCG/ACT AERS, Take 2 inhalations once daily., Disp: 4 g, Rfl: 4  No Known Allergies  I personally reviewed active problem list, medication list, allergies, family history, social history, health maintenance with the patient/caregiver today.   ROS  Ten systems reviewed and is negative except as mentioned in HPI   Objective  Vitals:   04/07/21 0927  BP: 108/62  Pulse: 85  Resp: 16  Temp: 97.8 F (36.6 C)  TempSrc: Oral  SpO2: 90%  Weight: 182 lb 6.4 oz (82.7 kg)  Height: '5\' 8"'  (1.727 m)    Body mass index is 27.73 kg/m.  Physical Exam  Constitutional: Patient appears well-developed and well-nourished.  No distress.  HEENT: head atraumatic, normocephalic, pupils equal and reactive to light, neck supple Cardiovascular: Normal rate, regular rhythm and normal heart sounds.  No murmur heard. No BLE edema. Pulmonary/Chest: Effort normal , mild inspiratory wheezing.  No respiratory distress. Abdominal: Soft.  There is no tenderness. Psychiatric: Patient has a normal mood and affect. behavior is normal. Judgment and thought content normal.   Recent Results (from the past 2160 hour(s))  COMPLETE METABOLIC PANEL WITH GFR     Status: Abnormal   Collection Time: 02/23/21  9:20 AM  Result Value Ref Range   Glucose, Bld 90 65 - 99 mg/dL    Comment: .            Fasting reference interval .    BUN 12 7 - 25 mg/dL   Creat 1.68 (H) 0.70 - 1.35 mg/dL   eGFR  45 (L) > OR = 60 mL/min/1.65m    Comment: The eGFR is based on the CKD-EPI 2021 equation. To calculate  the new eGFR from a previous Creatinine or Cystatin C result, go to https://www.kidney.org/professionals/ kdoqi/gfr%5Fcalculator    BUN/Creatinine Ratio 7 6 - 22 (calc)   Sodium 138 135 - 146 mmol/L   Potassium 4.4 3.5 - 5.3 mmol/L   Chloride 99 98 - 110 mmol/L   CO2 34 (H)  20 - 32 mmol/L   Calcium 9.8 8.6 - 10.3 mg/dL   Total Protein 7.2 6.1 - 8.1 g/dL   Albumin 4.4 3.6 - 5.1 g/dL   Globulin 2.8 1.9 - 3.7 g/dL (calc)   AG Ratio 1.6 1.0 - 2.5 (calc)   Total Bilirubin 0.5 0.2 - 1.2 mg/dL   Alkaline phosphatase (APISO) 89 35 - 144 U/L   AST 20 10 - 35 U/L   ALT 14 9 - 46 U/L  CBC w/Diff/Platelet     Status: None   Collection Time: 02/23/21  9:20 AM  Result Value Ref Range   WBC 6.0 3.8 - 10.8 Thousand/uL   RBC 5.23 4.20 - 5.80 Million/uL   Hemoglobin 15.5 13.2 - 17.1 g/dL   HCT 46.9 38.5 - 50.0 %   MCV 89.7 80.0 - 100.0 fL   MCH 29.6 27.0 - 33.0 pg   MCHC 33.0 32.0 - 36.0 g/dL   RDW 12.3 11.0 - 15.0 %   Platelets 193 140 - 400 Thousand/uL   MPV 11.3 7.5 - 12.5 fL   Neutro Abs 3,498 1,500 - 7,800 cells/uL   Lymphs Abs 1,554 850 - 3,900 cells/uL   Absolute Monocytes 618 200 - 950 cells/uL   Eosinophils Absolute 300 15 - 500 cells/uL   Basophils Absolute 30 0 - 200 cells/uL   Neutrophils Relative % 58.3 %   Total Lymphocyte 25.9 %   Monocytes Relative 10.3 %   Eosinophils Relative 5.0 %   Basophils Relative 0.5 %    PHQ2/9: Depression screen [facility] 2/9 04/07/2021 02/23/2021 09/01/2020 09/01/2020 05/20/2020  Decreased Interest 0 0 0 0 0  Down, Depressed, Hopeless 0 0 0 0 0  PHQ - 2 Score 0 0 0 0 0  Altered sleeping 0 0 - - 0  Tired, decreased energy 0 0 - - 0  Change in appetite 0 0 - - 0  Feeling bad or failure about yourself  0 0 - - 0  Trouble concentrating 0 0 - - 0  Moving slowly or fidgety/restless 0 0 - - 0  Suicidal thoughts 0 0 - - 0  PHQ-9 Score 0 0 - - 0   Difficult doing work/chores Not difficult at all Not difficult at all - - Not difficult at all  Some recent data might be hidden    phq 9 is negative   Fall Risk: Fall Risk  04/07/2021 02/23/2021 09/01/2020 05/20/2020 02/29/2020  Falls in the past year? 0 0 0 0 0  Number falls in past yr: 0 0 0 0 0  Injury with Fall? 0 0 0 0 0  Risk for fall due to : No Fall Risks - - - -  Follow up Falls prevention discussed - Falls evaluation completed - -      Functional Status Survey: Is the patient deaf or have difficulty hearing?: No Does the patient have difficulty seeing, even when wearing glasses/contacts?: Yes Does the patient have difficulty concentrating, remembering, or making decisions?: No Does the patient have difficulty walking or climbing stairs?: Yes Does the patient have difficulty dressing or bathing?: No Does the patient have difficulty doing errands alone such as visiting a doctor's office or shopping?: No    Assessment & Plan  1. COPD with acute exacerbation (HCC)  - predniSONE (STERAPRED UNI-PAK 21 TAB) 10 MG (21) TBPK tablet; Take as directed  Dispense: 21 tablet; Refill: 0  2. SOB (shortness of breath)  - predniSONE (STERAPRED UNI-PAK 21 TAB) 10 MG (21)  TBPK tablet; Take as directed  Dispense: 21 tablet; Refill: 0  3. Chest tightness   4. Type 2 diabetes mellitus with stage 3b chronic kidney disease, with long-term current use of insulin ([place])

## 2021-04-10 ENCOUNTER — Telehealth: Payer: Self-pay | Admitting: Acute Care

## 2021-04-10 NOTE — Telephone Encounter (Signed)
Attempted to contact pt. Left message for pt to call to schedule f/u Lung screening CT scan.

## 2021-04-11 ENCOUNTER — Other Ambulatory Visit: Payer: Self-pay | Admitting: *Deleted

## 2021-04-11 DIAGNOSIS — F17210 Nicotine dependence, cigarettes, uncomplicated: Secondary | ICD-10-CM

## 2021-04-19 ENCOUNTER — Ambulatory Visit
Admission: RE | Admit: 2021-04-19 | Discharge: 2021-04-19 | Disposition: A | Discharge status: Home / Self Care | Payer: BC Managed Care – PPO | Source: Ambulatory Visit | Attending: Acute Care | Admitting: Acute Care

## 2021-04-19 ENCOUNTER — Other Ambulatory Visit: Payer: Self-pay

## 2021-04-19 DIAGNOSIS — F17210 Nicotine dependence, cigarettes, uncomplicated: Secondary | ICD-10-CM | POA: Diagnosis present

## 2021-04-20 ENCOUNTER — Encounter: Payer: Self-pay | Admitting: Nurse Practitioner

## 2021-04-20 ENCOUNTER — Other Ambulatory Visit: Payer: Self-pay | Admitting: Acute Care

## 2021-04-20 ENCOUNTER — Other Ambulatory Visit: Payer: Self-pay

## 2021-04-20 ENCOUNTER — Ambulatory Visit: Payer: BC Managed Care – PPO | Admitting: Nurse Practitioner

## 2021-04-20 VITALS — BP 124/76 | HR 79 | Temp 98.3°F | Resp 16 | Ht 68.0 in | Wt 182.0 lb

## 2021-04-20 DIAGNOSIS — J441 Chronic obstructive pulmonary disease with (acute) exacerbation: Secondary | ICD-10-CM

## 2021-04-20 DIAGNOSIS — Z87891 Personal history of nicotine dependence: Secondary | ICD-10-CM

## 2021-04-20 DIAGNOSIS — F17200 Nicotine dependence, unspecified, uncomplicated: Secondary | ICD-10-CM

## 2021-04-20 MED ORDER — BREZTRI AEROSPHERE 160-9-4.8 MCG/ACT IN AERO: 2.0000 | INHALATION_SPRAY | Freq: Two times a day (BID) | RESPIRATORY_TRACT | 11 refills | Status: DC

## 2021-04-20 NOTE — Progress Notes (Signed)
LDCT Result LR 2 , + CAD Results Letter mailed 1/26  Annual screening

## 2021-04-20 NOTE — Progress Notes (Signed)
BP 124/76    Pulse 79    Temp 98.3 F (36.8 C) (Oral)    Resp 16    Ht _0  (1.727 m)    Wt 182 lb (82.6 kg)    SpO2 93%    BMI 27.67 kg/m    Subjective:    Patient ID: Zachary Wallace, male    DOB: Jan 02, 1956, 66 y.o.   MRN: [id number]  HPI: Zachary Wallace is a 66 y.o. male, here alone  Chief Complaint  Patient presents with   COPD    10 day follow up, still coughing, chest heavy, thick phlegm   COPD:  He says that he saw Dr. Ancil Boozer on 04/07/2021 for COPD exacerbation.  He was prescribed steroid taper. He says he completed treatment and feels a little better. He says he still feels a little winded.  His lung sound are a slight expiratory wheeze. He says he has been using his albuterol inhaler four times a day. He has also been using his Stiolto twice a day.  He denies any fever but says he is still coughing up phlegm.  He had a ct scan done of his chest yesterday and there is no sign of infection.  Will have him stop stiolto and switch to [employer].  Gave him a sample in office and sent in prescription. He says he has had two COPD flares recently.  Discussed referral to Pulmonology and he would like to do that.  Referral placed.   Relevant past medical, surgical, family and social history reviewed and updated as indicated. Interim medical history since our last visit reviewed. Allergies and medications reviewed and updated.  Review of Systems  Constitutional: Negative for fever or weight change.  Respiratory: Positive for cough and shortness of breath.   Cardiovascular: Negative for chest pain or palpitations.  Gastrointestinal: Negative for abdominal pain, no bowel changes.  Musculoskeletal: Negative for gait problem or joint swelling.  Skin: Negative for rash.  Neurological: Negative for dizziness or headache.  No other specific complaints in a complete review of systems (except as listed in HPI above).      Objective:    BP 124/76    Pulse 79    Temp 98.3 F (36.8 C) (Oral)    Resp  16    Ht _1  (1.727 m)    Wt 182 lb (82.6 kg)    SpO2 93%    BMI 27.67 kg/m   Wt Readings from Last 3 Encounters:  04/20/21 182 lb (82.6 kg)  04/19/21 200 lb (90.7 kg)  04/07/21 182 lb 6.4 oz (82.7 kg)    Physical Exam  Constitutional: Patient appears well-developed and well-nourished.  No distress.  HEENT: head atraumatic, normocephalic, pupils equal and reactive to light, neck supple Cardiovascular: Normal rate, regular rhythm and normal heart sounds.  No murmur heard. No BLE edema. Pulmonary/Chest: Effort normal and breath sounds slight wheeze noted. No respiratory distress. Abdominal: Soft.  There is no tenderness. Psychiatric: Patient has a normal mood and affect. behavior is normal. Judgment and thought content normal.   Results for orders placed or performed in visit on 02/23/21  COMPLETE METABOLIC PANEL WITH GFR  Result Value Ref Range   Glucose, Bld 90 65 - 99 mg/dL   BUN 12 7 - 25 mg/dL   Creat 1.68 (H) 0.70 - 1.35 mg/dL   eGFR 45 (L) > OR = 60 mL/min/1.10m   BUN/Creatinine Ratio 7 6 - 22 (calc)   Sodium 138 135 -  146 mmol/L   Potassium 4.4 3.5 - 5.3 mmol/L   Chloride 99 98 - 110 mmol/L   CO2 34 (H) 20 - 32 mmol/L   Calcium 9.8 8.6 - 10.3 mg/dL   Total Protein 7.2 6.1 - 8.1 g/dL   Albumin 4.4 3.6 - 5.1 g/dL   Globulin 2.8 1.9 - 3.7 g/dL (calc)   AG Ratio 1.6 1.0 - 2.5 (calc)   Total Bilirubin 0.5 0.2 - 1.2 mg/dL   Alkaline phosphatase (APISO) 89 35 - 144 U/L   AST 20 10 - 35 U/L   ALT 14 9 - 46 U/L  CBC w/Diff/Platelet  Result Value Ref Range   WBC 6.0 3.8 - 10.8 Thousand/uL   RBC 5.23 4.20 - 5.80 Million/uL   Hemoglobin 15.5 13.2 - 17.1 g/dL   HCT 46.9 38.5 - 50.0 %   MCV 89.7 80.0 - 100.0 fL   MCH 29.6 27.0 - 33.0 pg   MCHC 33.0 32.0 - 36.0 g/dL   RDW 12.3 11.0 - 15.0 %   Platelets 193 140 - 400 Thousand/uL   MPV 11.3 7.5 - 12.5 fL   Neutro Abs 3,498 1,500 - 7,800 cells/uL   Lymphs Abs 1,554 850 - 3,900 cells/uL   Absolute Monocytes 618 200 - 950  cells/uL   Eosinophils Absolute 300 15 - 500 cells/uL   Basophils Absolute 30 0 - 200 cells/uL   Neutrophils Relative % 58.3 %   Total Lymphocyte 25.9 %   Monocytes Relative 10.3 %   Eosinophils Relative 5.0 %   Basophils Relative 0.5 %      Assessment & Plan:   1. COPD with acute exacerbation ([place]) -stop using Stiolto, switch to [employer] - Budeson-Glycopyrrol-Formoterol (BREZTRI AEROSPHERE) 160-9-4.8 MCG/ACT AERO; Inhale 2 puffs into the lungs 2 (two) times daily.  Dispense: 10.7 g; Refill: 11 - Ambulatory referral to Pulmonology   Follow up plan: Return if symptoms worsen or fail to improve.

## 2021-05-09 LAB — HM DIABETES FOOT EXAM: HM Diabetic Foot Exam: NORMAL

## 2021-05-10 ENCOUNTER — Other Ambulatory Visit: Payer: Self-pay | Admitting: Family Medicine

## 2021-05-10 DIAGNOSIS — N529 Male erectile dysfunction, unspecified: Secondary | ICD-10-CM

## 2021-05-10 MED ORDER — SILDENAFIL CITRATE 100 MG PO TABS: ORAL_TABLET | ORAL | 0 refills | Status: DC

## 2021-05-10 NOTE — Telephone Encounter (Signed)
Requested Prescriptions  Pending Prescriptions Disp Refills   sildenafil (VIAGRA) 100 MG tablet 30 tablet 0    Sig: Take as needed.     Urology: Erectile Dysfunction Agents Passed - 05/10/2021  8:54 AM      Passed - AST in normal range and within 360 days    AST  Date Value Ref Range Status  02/23/2021 20 10 - 35 U/L Final         Passed - ALT in normal range and within 360 days    ALT  Date Value Ref Range Status  02/23/2021 14 9 - 46 U/L Final         Passed - Last BP in normal range    BP Readings from Last 1 Encounters:  04/20/21 124/76         Passed - Valid encounter within last 12 months    Recent Outpatient Visits          2 weeks ago COPD with acute exacerbation [facility])   [place], FNP   1 month ago COPD with acute exacerbation [facility])   [facility] Steele Sizer, MD   2 months ago Essential hypertension   [facility] Teodora Medici, DO   8 months ago Routine general medical examination at a health care facility   [place], NP   11 months ago Essential hypertension   [facility] Delsa Grana, PA-C      Future Appointments            In 3 months Teodora Medici, [facility], [facility]

## 2021-05-10 NOTE — Telephone Encounter (Signed)
Medication Refill - Medication: sildenafil (VIAGRA) 100 MG tablet  Has the patient contacted their pharmacy? Yes.   (Agent: If no, request that the patient contact the pharmacy for the refill. If patient does not wish to contact the pharmacy document the reason why and proceed with request.) (Agent: If yes, when and what did the pharmacy advise?) they have sent a request to office twice   Preferred Pharmacy (with phone number or street name): [place], [place] - [address]  [address], [place] [address]  Phone:  [phone]  Fax:  [phone]  Has the patient been seen for an appointment in the last year OR does the patient have an upcoming appointment? Yes.    Agent: Please be advised that RX refills may take up to 3 business days. We ask that you follow-up with your pharmacy.

## 2021-06-02 ENCOUNTER — Other Ambulatory Visit: Payer: Self-pay

## 2021-06-02 ENCOUNTER — Ambulatory Visit: Payer: BC Managed Care – PPO | Admitting: Internal Medicine

## 2021-06-02 ENCOUNTER — Encounter: Payer: Self-pay | Admitting: Internal Medicine

## 2021-06-02 DIAGNOSIS — F17210 Nicotine dependence, cigarettes, uncomplicated: Secondary | ICD-10-CM

## 2021-06-02 DIAGNOSIS — J449 Chronic obstructive pulmonary disease, unspecified: Secondary | ICD-10-CM | POA: Diagnosis not present

## 2021-06-02 MED ORDER — BREZTRI AEROSPHERE 160-9-4.8 MCG/ACT IN AERO: 2.0000 | INHALATION_SPRAY | Freq: Two times a day (BID) | RESPIRATORY_TRACT | 0 refills | Status: DC

## 2021-06-02 NOTE — Patient Instructions (Signed)
Plan A = Automatic = Always=    Breztri Take 2 puffs first thing in am and then another 2 puffs about 12 hours later.  ?  ?Work on inhaler technique:  relax and gently blow all the way out then take a nice smooth full deep breath back in, triggering the inhaler at same time you start breathing in.  Hold for up to 5 seconds if you can. Blow out thru nose. Rinse and gargle with water when done.  If mouth or throat bother you at all,  try brushing teeth/gums/tongue with arm and hammer toothpaste/ make a slurry and gargle and spit out.  ? ?  ?Plan B = Backup (to supplement plan A, not to replace it) ?Only use your albuterol inhaler as a rescue medication to be used if you can't catch your breath by resting or doing a relaxed purse lip breathing pattern.  ?- The less you use it, the better it will work when you need it. ?- Ok to use the inhaler up to 2 puffs  every 4 hours if you must but call for appointment if use goes up over your usual need ?- Don't leave home without it !!  (think of it like the spare tire for your car)  ? ?Plan C = Crisis (instead of Plan B but only if Plan B stops working) ?- only use your albuterol nebulizer if you first try Plan B and it fails to help > ok to use the nebulizer up to every 4 hours but if start needing it regularly call for immediate appointment ? ?Please schedule a follow up visit in 3 months but call sooner if needed with pfts on return  ? ?  ?

## 2021-06-02 NOTE — Addendum Note (Signed)
Addended by: Carlisle Cater on: 06/02/2021 12:01 PM ? ? Modules accepted: Orders ? ?

## 2021-06-02 NOTE — Assessment & Plan Note (Signed)
Counseled re importance of smoking cessation but did not meet time criteria for separate billing   ? ?Each maintenance medication was reviewed in detail including emphasizing most importantly the difference between maintenance and prns and under what circumstances the prns are to be triggered using an action plan format where appropriate. ? ?Total time for H and P, chart review, counseling, reviewing ABC action plan, directly observing portions of ambulatory 02 saturation study/ and generating customized AVS unique to this office visit / same day charting > 30 min  ? ? ?     ?  ?      ?

## 2021-06-02 NOTE — Assessment & Plan Note (Signed)
Active smoker ?- emphysema noted on chest CT Dec 2018 ?- PFT's  08/13/17  FEV1 0.66  (22 % ) ratio 0.26  p 13 % improvement from saba p ? nothing prior to study with DLCO  10.3 (44%)  and FV curve classic concavity   ?- 06/02/2021  After extensive coaching inhaler device,  effectiveness =    50% (short Ti) ? ?Group D in terms of symptom/risk and laba/lama/ICS  therefore appropriate rx at this point >>>   resume breztri and prn saba  ? ?Re SABA :  I spent extra time with pt today reviewing appropriate use of albuterol for prn use on exertion with the following points: ?1) saba is for relief of sob that does not improve by walking a slower pace or resting but rather if the pt does not improve after trying this first. ?2) If the pt is convinced, as many are, that saba helps recover from activity faster then it's easy to tell if this is the case by re-challenging : ie stop, take the inhaler, then p 5 minutes try the exact same activity (intensity of workload) that just caused the symptoms and see if they are substantially diminished or not after saba ?3) if there is an activity that reproducibly causes the symptoms, try the saba 15 min before the activity on alternate days  ? ?If in fact the saba really does help, then fine to continue to use it prn but advised may need to look closer at the maintenance regimen being used to achieve better control of airways disease with exertion.  ? ? ?F/u with pfts in 3 m   ?

## 2021-06-02 NOTE — Progress Notes (Signed)
? ?Zachary Wallace, male    DOB: [birth year]  MRN: [id number] ? ? ?Brief patient profile:  ?18   yobm active smoker reported dx of asthma in his 47s referred to pulmonary clinic in [facility]  06/02/2021 by  Delsa Grana PA at [facility] for copd eval p exac 04/07/21 and placed on breztri which has helped despite poor hfa technique  ? ? ? ? ?History of Present Illness  ?06/02/2021  Pulmonary/ 1st office eval/ Jocilyn Trego / [employer] / maint on breztri  ?Chief Complaint  ?Patient presents with  ? Consult  ?Dyspnea:  can do front yard behind a self propelled mower s stopping x 30  ?Cough: after lies down but does fine flat and no am flare > white mucus  ?Sleep: one pillow  ?SABA use:  ? ?Past Medical History:  ?Diagnosis Date  ? COPD (chronic obstructive pulmonary disease) ([place])   ? Diabetes mellitus without complication ([place])   ? Hyperlipidemia   ? Hypertension   ? OSA (obstructive sleep apnea)   ? ? ?Outpatient Medications Prior to Visit  ?Medication Sig Dispense Refill  ? albuterol (VENTOLIN HFA) 108 (90 Base) MCG/ACT inhaler INHALE 2 PUFFS BY MOUTH EVERY 6 HOURS AS NEEDED FOR SHORTNESS OF BREATH OR WHEEZING. 18 g 8  ? amLODipine (NORVASC) 10 MG tablet Take 1 tablet (10 mg total) by mouth daily. 90 tablet 3  ? aspirin 81 MG tablet Take 81 mg by mouth.    ? atorvastatin (LIPITOR) 40 MG tablet Take 1 tablet (40 mg total) by mouth daily. 90 tablet 3  ? B-D UF III MINI PEN NEEDLES 31G X 5 MM MISC  (Patient not taking: Reported on 04/20/2021)    ? Budeson-Glycopyrrol-Formoterol (BREZTRI AEROSPHERE) 160-9-4.8 MCG/ACT AERO Inhale 2 puffs into the lungs 2 (two) times daily. 10.7 g 11  ? Dulaglutide 0.75 MG/0.5ML SOPN Inject 0.5 mg into the skin once a week.    ? glucose blood (ONETOUCH VERIO) test strip  (Patient not taking: Reported on 04/20/2021)    ? insulin degludec (TRESIBA) 100 UNIT/ML FlexTouch Pen Inject into the skin.    ? losartan-hydrochlorothiazide (HYZAAR) 100-12.5 MG tablet Take 1 tablet by mouth daily. 90 tablet 1   ? metoprolol tartrate (LOPRESSOR) 100 MG tablet Take 1 tablet (100 mg total) by mouth 2 (two) times daily. 180 tablet 3  ? pioglitazone (ACTOS) 15 MG tablet Take 15 mg by mouth daily.    ?       ? Respiratory Therapy Supplies (NEBULIZER/TUBING/MOUTHPIECE) KIT Disp one nebulizer machine, tubing set and mouthpiece kit (Patient not taking: Reported on 04/20/2021) 1 kit 0  ? sildenafil (VIAGRA) 100 MG tablet Take as needed. 30 tablet 0  ? ?No facility-administered medications prior to visit.  ? ? ? ?Objective:  ?  ? ?BP 132/82 (BP Location: Left Arm, Patient Position: Sitting, Cuff Size: Normal)   Pulse 63   Temp (!) 97.5 ?F (36.4 ?C) (Oral)   Ht 5' 8.5" (1.74 m)   Wt 182 lb 9.6 oz (82.8 kg)   SpO2 93%   BMI 27.36 kg/m?  ? ?SpO2: 93 % ? ?HEENT : pt wearing mask not removed for exam due to covid -19 concerns.  ? ? ?NECK :  without JVD/Nodes/TM/ nl carotid upstrokes bilaterally ? ? ?LUNGS: no acc muscle use,  Mod barrel  contour chest wall with bilateral  Distant bs s audible wheeze and  without cough on insp or exp maneuvers and mod  Hyperresonant  to  percussion bilaterally   ? ? ?CV:  RRR  no s3 or murmur or increase in P2, and no edema  ? ?ABD:  soft and nontender with pos mid insp Hoover's  in the supine position. No bruits or organomegaly appreciated, bowel sounds nl ? ?MS:     ext warm without deformities, calf tenderness, cyanosis or clubbing ?No obvious joint restrictions  ? ?SKIN: warm and dry without lesions   ? ?NEURO:  alert, approp, nl sensorium with  no motor or cerebellar deficits apparent.  ?    ? ? ?   ?I personally reviewed images and agree with radiology impression as follows:  ? Chest LDSCT  04/19/21  ?Assessment  ? ?COPD GOLD ?   / still smoking  ?Active smoker ?- emphysema noted on chest CT Dec 2018 ?- PFT's  08/13/17  FEV1 0.66  (22 % ) ratio 0.26  p 13 % improvement from saba p ? nothing prior to study with DLCO  10.3 (44%)  and FV curve classic concavity   ?- 06/02/2021  After extensive  coaching inhaler device,  effectiveness =    50% (short Ti) ? ?Group D in terms of symptom/risk and laba/lama/ICS  therefore appropriate rx at this point >>>   resume breztri and prn saba  ? ?Re SABA :  I spent extra time with pt today reviewing appropriate use of albuterol for prn use on exertion with the following points: ?1) saba is for relief of sob that does not improve by walking a slower pace or resting but rather if the pt does not improve after trying this first. ?2) If the pt is convinced, as many are, that saba helps recover from activity faster then it's easy to tell if this is the case by re-challenging : ie stop, take the inhaler, then p 5 minutes try the exact same activity (intensity of workload) that just caused the symptoms and see if they are substantially diminished or not after saba ?3) if there is an activity that reproducibly causes the symptoms, try the saba 15 min before the activity on alternate days  ? ?If in fact the saba really does help, then fine to continue to use it prn but advised may need to look closer at the maintenance regimen being used to achieve better control of airways disease with exertion.  ? ?F/u with pfts in 3 m   ? ? ? ?Cigarette smoker ? Counseled re importance of smoking cessation but did not meet time criteria for separate billing   ? ?Each maintenance medication was reviewed in detail including emphasizing most importantly the difference between maintenance and prns and under what circumstances the prns are to be triggered using an action plan format where appropriate. ? ?Total time for H and P, chart review, counseling, reviewing ABC action plan, directly observing portions of ambulatory 02 saturation study/ and generating customized AVS unique to this office visit / same day charting > 30 min  ? ? ?    ? ?Christinia Gully, MD ?06/02/2021 ?    ?

## 2021-06-22 ENCOUNTER — Other Ambulatory Visit: Payer: Self-pay | Admitting: Internal Medicine

## 2021-06-22 DIAGNOSIS — N529 Male erectile dysfunction, unspecified: Secondary | ICD-10-CM

## 2021-06-23 NOTE — Telephone Encounter (Signed)
Requested Prescriptions  ?Pending Prescriptions Disp Refills  ?? sildenafil (VIAGRA) 100 MG tablet [Pharmacy Med Name: SILDENAFIL 100 MG TABLET] 30 tablet 0  ?  Sig: TAKE (1) TABLET BY MOUTH ONCE DAILY AS NEEDED.  ?  ? Urology: Erectile Dysfunction Agents Passed - 06/22/2021 11:27 AM  ?  ?  Passed - AST in normal range and within 360 days  ?  AST  ?Date Value Ref Range Status  ?02/23/2021 20 10 - 35 U/L Final  ?   ?  ?  Passed - ALT in normal range and within 360 days  ?  ALT  ?Date Value Ref Range Status  ?02/23/2021 14 9 - 46 U/L Final  ?   ?  ?  Passed - Last BP in normal range  ?  BP Readings from Last 1 Encounters:  ?06/02/21 132/82  ?   ?  ?  Passed - Valid encounter within last 12 months  ?  Recent Outpatient Visits   ?      ? 2 months ago COPD with acute exacerbation ([place])  ? [place], FNP  ? 2 months ago COPD with acute exacerbation [facility])  ? [facility] Steele Sizer, MD  ? 4 months ago Essential hypertension  ? [facility] Teodora Medici, DO  ? 9 months ago Routine general medical examination at a health care facility  ? [facility] Kathrine Haddock, NP  ? 1 year ago Essential hypertension  ? [facility] Delsa Grana, [place]  ?  ?  ?Future Appointments   ?        ? In 2 months Teodora Medici, [facility], [place]  ? In 2 months Wert, Christena Deem, MD [facility] [place]  ?  ? ?  ?  ?  ? ?

## 2021-07-31 ENCOUNTER — Other Ambulatory Visit: Payer: Self-pay | Admitting: Family Medicine

## 2021-07-31 DIAGNOSIS — N529 Male erectile dysfunction, unspecified: Secondary | ICD-10-CM

## 2021-08-17 ENCOUNTER — Ambulatory Visit: Payer: BC Managed Care – PPO | Attending: Internal Medicine

## 2021-08-17 DIAGNOSIS — F17210 Nicotine dependence, cigarettes, uncomplicated: Secondary | ICD-10-CM | POA: Diagnosis not present

## 2021-08-17 DIAGNOSIS — J449 Chronic obstructive pulmonary disease, unspecified: Secondary | ICD-10-CM | POA: Diagnosis present

## 2021-08-17 LAB — PULMONARY FUNCTION TEST ARMC ONLY
DL/VA % pred: 50 %
DL/VA: 2.11 ml/min/mmHg/L
DLCO unc % pred: 42 %
DLCO unc: 11.19 ml/min/mmHg
FEF 25-75 Post: 0.41 L/sec
FEF 25-75 Pre: 0.34 L/sec
FEF2575-%Change-Post: 18 %
FEF2575-%Pred-Post: 15 %
FEF2575-%Pred-Pre: 12 %
FEV1-%Change-Post: 7 %
FEV1-%Pred-Post: 32 %
FEV1-%Pred-Pre: 30 %
FEV1-Post: 0.95 L
FEV1-Pre: 0.88 L
FEV1FVC-%Change-Post: 7 %
FEV1FVC-%Pred-Pre: 48 %
FEV6-%Change-Post: 2 %
FEV6-%Pred-Post: 59 %
FEV6-%Pred-Pre: 58 %
FEV6-Post: 2.19 L
FEV6-Pre: 2.14 L
FEV6FVC-%Change-Post: 3 %
FEV6FVC-%Pred-Post: 97 %
FEV6FVC-%Pred-Pre: 94 %
FVC-%Change-Post: 0 %
FVC-%Pred-Post: 61 %
FVC-%Pred-Pre: 61 %
FVC-Post: 2.33 L
FVC-Pre: 2.35 L
Post FEV1/FVC ratio: 41 %
Post FEV6/FVC ratio: 94 %
Pre FEV1/FVC ratio: 38 %
Pre FEV6/FVC Ratio: 91 %
RV % pred: 113 %
RV: 2.56 L
TLC % pred: 84 %
TLC: 5.75 L

## 2021-08-18 ENCOUNTER — Ambulatory Visit: Payer: Self-pay | Admitting: *Deleted

## 2021-08-18 ENCOUNTER — Ambulatory Visit: Payer: BC Managed Care – PPO | Admitting: Internal Medicine

## 2021-08-18 VITALS — BP 118/82 | HR 81 | Temp 98.1°F | Resp 18 | Ht 68.0 in | Wt 185.2 lb

## 2021-08-18 DIAGNOSIS — J441 Chronic obstructive pulmonary disease with (acute) exacerbation: Secondary | ICD-10-CM | POA: Diagnosis not present

## 2021-08-18 DIAGNOSIS — K219 Gastro-esophageal reflux disease without esophagitis: Secondary | ICD-10-CM | POA: Diagnosis not present

## 2021-08-18 MED ORDER — PANTOPRAZOLE SODIUM 20 MG PO TBEC: 20.0000 mg | DELAYED_RELEASE_TABLET | Freq: Every day | ORAL | 0 refills | Status: DC

## 2021-08-18 MED ORDER — AMOXICILLIN-POT CLAVULANATE 875-125 MG PO TABS: 1.0000 | ORAL_TABLET | Freq: Two times a day (BID) | ORAL | 0 refills | Status: AC

## 2021-08-18 MED ORDER — PREDNISONE 20 MG PO TABS: 20.0000 mg | ORAL_TABLET | Freq: Two times a day (BID) | ORAL | 0 refills | Status: AC

## 2021-08-18 NOTE — Telephone Encounter (Signed)
Summary: cold like symptoms / rx req   The patient has experienced a cold for roughly a month   The patient has experienced congestion, cough and discomfort when digesting food   The patient would like to be prescribed something for their symptoms   Please contact further when possible      Reason for Disposition  [1] Continuous (nonstop) coughing interferes with work or school AND [2] no improvement using cough treatment per Care Advice  Answer Assessment - Initial Assessment Questions 1. ONSET: "When did the cough begin?"      1 month or longer 2. SEVERITY: "How bad is the cough today?"      Keeping patient up at night, indigestion 3. SPUTUM: "Describe the color of your sputum" (none, dry cough; clear, white, yellow, green)     white 4. HEMOPTYSIS: "Are you coughing up any blood?" If so ask: "How much?" (flecks, streaks, tablespoons, etc.)     no 5. DIFFICULTY BREATHING: "Are you having difficulty breathing?" If Yes, ask: "How bad is it?" (e.g., mild, moderate, severe)    - MILD: No SOB at rest, mild SOB with walking, speaks normally in sentences, can lie down, no retractions, pulse < 100.    - MODERATE: SOB at rest, SOB with minimal exertion and prefers to sit, cannot lie down flat, speaks in phrases, mild retractions, audible wheezing, pulse 100-120.    - SEVERE: Very SOB at rest, speaks in single words, struggling to breathe, sitting hunched forward, retractions, pulse > 120      Not since using inhaler 6. FEVER: "Do you have a fever?" If Yes, ask: "What is your temperature, how was it measured, and when did it start?"     no 7. CARDIAC HISTORY: "Do you have any history of heart disease?" (e.g., heart attack, congestive heart failure)      no 8. LUNG HISTORY: "Do you have any history of lung disease?"  (e.g., pulmonary embolus, asthma, emphysema)     asthma 9. PE RISK FACTORS: "Do you have a history of blood clots?" (or: recent major surgery, recent prolonged travel,  bedridden)     no 10. OTHER SYMPTOMS: "Do you have any other symptoms?" (e.g., runny nose, wheezing, chest pain)       Chest congestion, indigestion- Tums helped  11. PREGNANCY: "Is there any chance you are pregnant?" "When was your last menstrual period?"         12. TRAVEL: "Have you traveled out of the country in the last month?" (e.g., travel history, exposures)  Protocols used: Cough - Acute Productive-A-AH

## 2021-08-18 NOTE — Patient Instructions (Signed)
It was great seeing you today!  Plan discussed at today's visit: -Steroids, antibitoics sent to pharmacy - monitor blood sugars closely as steroids will make sugars run higher -Continue Breztri daily and Albuterol as needed  -Acid reflux medication sent to pharmacy as well  Follow up in: 2 weeks   Take care and let us know if you have any questions or concerns prior to your next visit.  Dr. Rosana Berger   Gastroesophageal Reflux Disease, Adult  Gastroesophageal reflux (GER) happens when acid from the stomach flows up into the tube that connects the mouth and the stomach (esophagus). Normally, food travels down the esophagus and stays in the stomach to be digested. With GER, food and stomach acid sometimes move back up into the esophagus. You may have a disease called gastroesophageal reflux disease (GERD) if the reflux: Happens often. Causes frequent or very bad symptoms. Causes problems such as damage to the esophagus. When this happens, the esophagus becomes sore and swollen. Over time, GERD can make small holes (ulcers) in the lining of the esophagus. What are the causes? This condition is caused by a problem with the muscle between the esophagus and the stomach. When this muscle is weak or not normal, it does not close properly to keep food and acid from coming back up from the stomach. The muscle can be weak because of: Tobacco use. Pregnancy. Having a certain type of hernia (hiatal hernia). Alcohol use. Certain foods and drinks, such as coffee, chocolate, onions, and peppermint. What increases the risk? Being overweight. Having a disease that affects your connective tissue. Taking NSAIDs, such a ibuprofen. What are the signs or symptoms? Heartburn. Difficult or painful swallowing. The feeling of having a lump in the throat. A bitter taste in the mouth. Bad breath. Having a lot of saliva. Having an upset or bloated stomach. Burping. Chest pain. Different conditions can  cause chest pain. Make sure you see your doctor if you have chest pain. Shortness of breath or wheezing. A long-term cough or a cough at night. Wearing away of the surface of teeth (tooth enamel). Weight loss. How is this treated? Making changes to your diet. Taking medicine. Having surgery. Treatment will depend on how bad your symptoms are. Follow these instructions at home: Eating and drinking  Follow a diet as told by your doctor. You may need to avoid foods and drinks such as: Coffee and tea, with or without caffeine. Drinks that contain alcohol. Energy drinks and sports drinks. Bubbly (carbonated) drinks or sodas. Chocolate and cocoa. Peppermint and mint flavorings. Garlic and onions. Horseradish. Spicy and acidic foods. These include peppers, chili powder, curry powder, vinegar, hot sauces, and BBQ sauce. Citrus fruit juices and citrus fruits, such as oranges, lemons, and limes. Tomato-based foods. These include red sauce, chili, salsa, and pizza with red sauce. Fried and fatty foods. These include donuts, french fries, potato chips, and high-fat dressings. High-fat meats. These include hot dogs, rib eye steak, sausage, ham, and bacon. High-fat dairy items, such as whole milk, butter, and cream cheese. Eat small meals often. Avoid eating large meals. Avoid drinking large amounts of liquid with your meals. Avoid eating meals during the 2-3 hours before bedtime. Avoid lying down right after you eat. Do not exercise right after you eat. Lifestyle  Do not smoke or use any products that contain nicotine or tobacco. If you need help quitting, ask your doctor. Try to lower your stress. If you need help doing this, ask your doctor. If you  are overweight, lose an amount of weight that is healthy for you. Ask your doctor about a safe weight loss goal. General instructions Pay attention to any changes in your symptoms. Take over-the-counter and prescription medicines only as told  by your doctor. Do not take aspirin, ibuprofen, or other NSAIDs unless your doctor says it is okay. Wear loose clothes. Do not wear anything tight around your waist. Raise (elevate) the head of your bed about 6 inches (15 cm). You may need to use a wedge to do this. Avoid bending over if this makes your symptoms worse. Keep all follow-up visits. Contact a doctor if: You have new symptoms. You lose weight and you do not know why. You have trouble swallowing or it hurts to swallow. You have wheezing or a cough that keeps happening. You have a hoarse voice. Your symptoms do not get better with treatment. Get help right away if: You have sudden pain in your arms, neck, jaw, teeth, or back. You suddenly feel sweaty, dizzy, or light-headed. You have chest pain or shortness of breath. You vomit and the vomit is green, yellow, or black, or it looks like blood or coffee grounds. You faint. Your poop (stool) is red, bloody, or black. You cannot swallow, drink, or eat. These symptoms may represent a serious problem that is an emergency. Do not wait to see if the symptoms will go away. Get medical help right away. Call your local emergency services (911 in the [place] Do not drive yourself to the hospital. Summary If a person has gastroesophageal reflux disease (GERD), food and stomach acid move back up into the esophagus and cause symptoms or problems such as damage to the esophagus. Treatment will depend on how bad your symptoms are. Follow a diet as told by your doctor. Take all medicines only as told by your doctor. This information is not intended to replace advice given to you by your health care provider. Make sure you discuss any questions you have with your health care provider. Document Revised: 09/21/2019 Document Reviewed: 09/21/2019 Elsevier Patient Education  [place].

## 2021-08-18 NOTE — Progress Notes (Signed)
Acute Office Visit  Subjective:     Patient ID: Zachary Wallace, male    DOB: 1955/11/22, 66 y.o.   MRN: [id number]  Chief Complaint  Patient presents with   URI    Cough, chest congestion, indegestion    HPI Patient is in today for cough, congestion and indigestion.   COPD: -COPD status: exacerbated -Current medications: Breztri daily, albuterol 3 times daily -Satisfied with current treatment?: no -Oxygen use: no -Dyspnea frequency: Daily with activities -Cough frequency: Frequently, worsening productive cough with thick white mucus -Rescue inhaler frequency: 3 times daily -Limitation of activity: yes -Productive cough: Yes -Last Spirometry/PFTs: Yesterday, results pending -Pneumovax: Up to Date -Influenza: Up to Date  URI Compliant:  -Worst symptom: cough  -Fever: no -Cough: yes cough productive with thick white mucus  -Shortness of breath: yes, with activity  -Wheezing: yes -Chest pain: yes, with cough -Chest tightness: yes -Chest congestion: yes -Nasal congestion: no -Runny nose: yes, white thick  -Post nasal drip: no -Sore throat: no -Sinus pressure: no -Headache: no -Ear pain: no  -Ear pressure:  no  GERD:  -Also complaining of acid reflux with burning type epigastric pain -Denies nausea or vomiting, however appetite is decreased.  Weight stable. -Currently taking Tums as needed  Review of Systems  Constitutional:  Negative for chills and fever.  HENT:  Negative for congestion, ear pain, sinus pain and sore throat.   Respiratory:  Positive for cough, sputum production, shortness of breath and wheezing.   Cardiovascular:  Negative for chest pain.  Gastrointestinal:  Positive for abdominal pain and heartburn. Negative for constipation, diarrhea, nausea and vomiting.  Genitourinary:  Negative for dysuria and hematuria.       Objective:    BP 118/82   Pulse 81   Temp 98.1 F (36.7 C)   Resp 18   Ht '5\' 8"'$  (1.727 m)   Wt 185 lb 3.2 oz (84 kg)    SpO2 90%   BMI 28.16 kg/m  BP Readings from Last 3 Encounters:  08/18/21 118/82  06/02/21 132/82  04/20/21 124/76   Wt Readings from Last 3 Encounters:  08/18/21 185 lb 3.2 oz (84 kg)  06/02/21 182 lb 9.6 oz (82.8 kg)  04/20/21 182 lb (82.6 kg)      Physical Exam Constitutional:      Appearance: Normal appearance.  HENT:     Head: Normocephalic and atraumatic.     Right Ear: Ear canal and external ear normal. There is impacted cerumen.     Left Ear: Ear canal and external ear normal.     Nose: Nose normal.     Mouth/Throat:     Mouth: Mucous membranes are moist.     Pharynx: Oropharynx is clear.  Eyes:     Conjunctiva/sclera: Conjunctivae normal.  Cardiovascular:     Rate and Rhythm: Normal rate and regular rhythm.  Pulmonary:     Effort: Pulmonary effort is normal.     Breath sounds: Wheezing present.     Comments: Inspiratory wheezes throughout with decreased air movement, no rhonchi auscultated Abdominal:     General: Bowel sounds are normal. There is no distension.     Palpations: Abdomen is soft.     Tenderness: There is no abdominal tenderness. There is no guarding or rebound.  Musculoskeletal:     Right lower leg: No edema.     Left lower leg: No edema.  Skin:    General: Skin is warm and dry.  Neurological:  General: No focal deficit present.     Mental Status: He is alert. Mental status is at baseline.  Psychiatric:        Mood and Affect: Mood normal.        Behavior: Behavior normal.    No results found for any visits on 08/18/21.      Assessment & Plan:   1. COPD with acute exacerbation [facility]): Treat with prednisone 40 mg x 5 days as well as Augmentin 875 mg twice daily x5 days for COPD exacerbation.  Continue Breztri and albuterol as needed.  He does have follow-up with his pulmonologist scheduled for 09/01/21.  He did undergo an updated pulmonary function test yesterday, results pending. He is a diabetic, follows with Endocrinology, last A1c  6.6%. Discussed how steroids will increase sugars, monitor closely.   - predniSONE (DELTASONE) 20 MG tablet; Take 1 tablet (20 mg total) by mouth 2 (two) times daily with a meal for 5 days.  Dispense: 10 tablet; Refill: 0 - amoxicillin-clavulanate (AUGMENTIN) 875-125 MG tablet; Take 1 tablet by mouth 2 (two) times daily for 5 days.  Dispense: 10 tablet; Refill: 0  2. Gastroesophageal reflux disease, unspecified whether esophagitis present: Discussed lifestyle changes, such as decreasing acidic and spicy foods and elevating the bed after eating.  Will treat with Protonix 20 mg. Follow-up scheduled for 2 weeks.  - pantoprazole (PROTONIX) 20 MG tablet; Take 1 tablet (20 mg total) by mouth daily.  Dispense: 90 tablet; Refill: 0   Return in about 2 weeks (around 09/01/2021) for please switch 6/1 to the week after .  Teodora Medici, DO

## 2021-08-18 NOTE — Telephone Encounter (Signed)
  Chief Complaint: cough, congestion Symptoms: cough with chest congestion, indigestion Frequency: 1 month Pertinent Negatives: Patient denies fever, SOB Disposition: '[]'$ ED /'[]'$ Urgent Care (no appt availability in office) / '[x]'$ Appointment(In office/virtual)/ '[]'$  [place] [facility]/ '[]'$ Home Care/ '[]'$ Refused Recommended Disposition /'[]'$ [facility]/ '[]'$  Follow-up with PCP Additional Notes:

## 2021-08-22 LAB — HEMOGLOBIN A1C
Hemoglobin A1C: 7.1
Hemoglobin A1C: 7.1

## 2021-08-24 ENCOUNTER — Ambulatory Visit: Payer: BC Managed Care – PPO | Admitting: Internal Medicine

## 2021-08-29 NOTE — Progress Notes (Signed)
Tried calling the pt and there was no answer- LMTCB.  

## 2021-09-01 ENCOUNTER — Encounter: Payer: Self-pay | Admitting: Internal Medicine

## 2021-09-01 ENCOUNTER — Ambulatory Visit: Payer: BC Managed Care – PPO | Admitting: Internal Medicine

## 2021-09-01 DIAGNOSIS — J449 Chronic obstructive pulmonary disease, unspecified: Secondary | ICD-10-CM | POA: Diagnosis not present

## 2021-09-01 DIAGNOSIS — F17210 Nicotine dependence, cigarettes, uncomplicated: Secondary | ICD-10-CM | POA: Diagnosis not present

## 2021-09-01 MED ORDER — FAMOTIDINE 20 MG PO TABS: ORAL_TABLET | ORAL | 11 refills | Status: DC

## 2021-09-01 MED ORDER — PANTOPRAZOLE SODIUM 40 MG PO TBEC: 40.0000 mg | DELAYED_RELEASE_TABLET | Freq: Every day | ORAL | 2 refills | Status: DC

## 2021-09-01 NOTE — Progress Notes (Deleted)
Acute Office Visit  Subjective:     Patient ID: Zachary Wallace, male    DOB: 1955-06-03, 66 y.o.   MRN: [id number]  No chief complaint on file.   HPI Patient is in today for follow up on recent COPD exacerbation and abdominal pain.    COPD: -COPD status: exacerbated -Current medications: Breztri daily, albuterol 3 times daily -Satisfied with current treatment?: no -Oxygen use: no -Dyspnea frequency: Daily with activities -Cough frequency: Frequently, worsening productive cough with thick white mucus -Rescue inhaler frequency: 3 times daily -Limitation of activity: yes -Productive cough: Yes -Last Spirometry/PFTs: Yesterday, results pending -Pneumovax: Up to Date -Influenza: Up to Date -Seen on 5/26 for COPD exacerbation, treated with Prednisone 40 mg x 5 days. Augmentin BID x 5 days   GERD:  -Had also been complaining of acid reflux with burning type epigastric pain -Denies nausea or vomiting, however appetite is decreased.  Weight stable. -Currently taking Tums as needed - started on Protonix 20 mg at LOV today he states that   Review of Systems  Constitutional:  Negative for chills and fever.  HENT:  Negative for congestion, ear pain, sinus pain and sore throat.   Respiratory:  Positive for cough, sputum production, shortness of breath and wheezing.   Cardiovascular:  Negative for chest pain.  Gastrointestinal:  Positive for abdominal pain and heartburn. Negative for constipation, diarrhea, nausea and vomiting.  Genitourinary:  Negative for dysuria and hematuria.        Objective:    There were no vitals taken for this visit. BP Readings from Last 3 Encounters:  08/18/21 118/82  06/02/21 132/82  04/20/21 124/76   Wt Readings from Last 3 Encounters:  08/18/21 185 lb 3.2 oz (84 kg)  06/02/21 182 lb 9.6 oz (82.8 kg)  04/20/21 182 lb (82.6 kg)      Physical Exam Constitutional:      Appearance: Normal appearance.  HENT:     Head: Normocephalic and  atraumatic.     Right Ear: Ear canal and external ear normal. There is impacted cerumen.     Left Ear: Ear canal and external ear normal.     Nose: Nose normal.     Mouth/Throat:     Mouth: Mucous membranes are moist.     Pharynx: Oropharynx is clear.  Eyes:     Conjunctiva/sclera: Conjunctivae normal.  Cardiovascular:     Rate and Rhythm: Normal rate and regular rhythm.  Pulmonary:     Effort: Pulmonary effort is normal.     Breath sounds: Wheezing present.     Comments: Inspiratory wheezes throughout with decreased air movement, no rhonchi auscultated Abdominal:     General: Bowel sounds are normal. There is no distension.     Palpations: Abdomen is soft.     Tenderness: There is no abdominal tenderness. There is no guarding or rebound.  Musculoskeletal:     Right lower leg: No edema.     Left lower leg: No edema.  Skin:    General: Skin is warm and dry.  Neurological:     General: No focal deficit present.     Mental Status: He is alert. Mental status is at baseline.  Psychiatric:        Mood and Affect: Mood normal.        Behavior: Behavior normal.     No results found for any visits on 09/01/21.      Assessment & Plan:   1. COPD with acute exacerbation ([place]):  Treat with prednisone 40 mg x 5 days as well as Augmentin 875 mg twice daily x5 days for COPD exacerbation.  Continue Breztri and albuterol as needed.  He does have follow-up with his pulmonologist scheduled for 09/01/21.  He did undergo an updated pulmonary function test yesterday, results pending. He is a diabetic, follows with Endocrinology, last A1c 6.6%. Discussed how steroids will increase sugars, monitor closely.   - predniSONE (DELTASONE) 20 MG tablet; Take 1 tablet (20 mg total) by mouth 2 (two) times daily with a meal for 5 days.  Dispense: 10 tablet; Refill: 0 - amoxicillin-clavulanate (AUGMENTIN) 875-125 MG tablet; Take 1 tablet by mouth 2 (two) times daily for 5 days.  Dispense: 10 tablet; Refill:  0  2. Gastroesophageal reflux disease, unspecified whether esophagitis present: Discussed lifestyle changes, such as decreasing acidic and spicy foods and elevating the bed after eating.  Will treat with Protonix 20 mg. Follow-up scheduled for 2 weeks.  - pantoprazole (PROTONIX) 20 MG tablet; Take 1 tablet (20 mg total) by mouth daily.  Dispense: 90 tablet; Refill: 0   No follow-ups on file.  Teodora Medici, DO

## 2021-09-01 NOTE — Patient Instructions (Signed)
Plan A = Automatic = Always=    Breztri Take 2 puffs first thing in am and then another 2 puffs about 12 hours later.    Work on inhaler technique:  relax and gently blow all the way out then take a nice smooth full deep breath back in, triggering the inhaler at same time you start breathing in.  Hold for up to 5 seconds if you can. Blow out thru nose. Rinse and gargle with water when done.  If mouth or throat bother you at all,  try brushing teeth/gums/tongue with arm and hammer toothpaste/ make a slurry and gargle and spit out.   >>> remember how golfers warm by taking practice swings   Plan B = Backup (to supplement plan A, not to replace it) Only use your albuterol inhaler as a rescue medication to be used if you can't catch your breath by resting or doing a relaxed purse lip breathing pattern.  - The less you use it, the better it will work when you need it. - Ok to use the inhaler up to 2 puffs  every 4 hours if you must but call for appointment if use goes up over your usual need - Don't leave home without it !!  (think of it like the starter for your car)    Plan C = Crisis (instead of Plan B but only if Plan B stops working) - only use your albuterol nebulizer if you first try Plan B and it fails to help > ok to use the nebulizer up to every 4 hours but if start needing it regularly call for immediate appointment     Also ok to [facility] to try albuterol 15 min before an activity (on alternating days)  that you know would usually make you short of breath and see if it makes any difference and if makes none then don't take albuterol after activity unless you can't catch your breath as this means it's the resting that helps, not the albuterol.  For cough / congestion >  mucinex dm 1200 mg every 12 hours   No mint menthol or chocolate products   Pantoprazole (protonix) 40 mg   Take  30-60 min before first meal of the day and Pepcid (famotidine)  20 mg after supper until return to office - this  is the best way to tell whether stomach acid is contributing to your problem.    Please schedule a follow up office visit in 6 weeks, call sooner if needed - bring both inhalers

## 2021-09-01 NOTE — Assessment & Plan Note (Signed)
4-5 min discussion re active cigarette smoking in addition to office E&M  Ask about tobacco use:   ongoing Advise quitting   I took an extended  opportunity with this patient to outline the consequences of continued cigarette use  in airway disorders based on all the data we have from the multiple national lung health studies (perfomed over decades at millions of dollars in cost)  indicating that smoking cessation, not choice of inhalers or physicians, is the most important aspect of his care.   Assess willingness:  Not committed at this point Assist in quit attempt:  Per PCP when ready Arrange follow up:   Follow up per Primary Care planned        

## 2021-09-01 NOTE — Assessment & Plan Note (Addendum)
Active smoker - emphysema noted on chest CT Dec 2018 - PFT's  08/13/17  FEV1 0.66  (22 % ) ratio 0.26  p 13 % improvement from saba p ? nothing prior to study with DLCO  10.3 (44%)  and FV curve classic concavity   - PFT's  08/17/21   FEV1 0.95 (32 % ) ratio 0.41  p 6 % improvement from saba p breztri prior to study with DLCO  11.9 (42%)  FV curve classically convcave   >>>   09/01/2021  After extensive coaching inhaler device,  effectiveness =    75% with hfa > continue breztri plus approp saba and short term gerd rx due to upper airway wheeze on exam > f/u in 6 weeks          Each maintenance medication was reviewed in detail including emphasizing most importantly the difference between maintenance and prns and under what circumstances the prns are to be triggered using an action plan format where appropriate.  Total time for H and P, chart review, counseling, reviewing hfa  device(s) and generating customized AVS unique to this office visit / same day charting = 25 min

## 2021-09-01 NOTE — Progress Notes (Signed)
Zachary Wallace, male    DOB: Nov 23, 1955  MRN: [id number]   Brief patient profile:  83   yobm active smoker reported dx of asthma in his 98s referred to pulmonary clinic [facility]  06/02/2021 by  Zachary Grana PA at [facility] for copd eval p exac 04/07/21 and placed on breztri which has helped despite poor hfa technique      History of Present Illness  06/02/2021  Pulmonary/ 1st office eval/ Zachary Wallace / [place] / maint on [employer] Complaint  Patient presents with   Consult  Dyspnea:  can do front yard behind a self propelled mower s stopping x 30  Cough: after lies down but does fine flat and no am flare > white mucus  Sleep: one pillow  Rec Plan A = Automatic = Always=    Breztri Take 2 puffs first thing in am and then another 2 puffs about 12 hours later.  Work on inhaler technique:   Plan B = Backup (to supplement plan A, not to replace it) Only use your albuterol inhaler as a rescue medication Plan C = Crisis (instead of Plan B but only if Plan B stops working) - only use your albuterol nebulizer if you first try Plan B  Please schedule a follow up visit in 3 months but call sooner if needed with pfts on return     09/01/2021  f/u ov/Zachary Wallace/ [facility] re: GOLD 3  maint on breztri   Chief Complaint  Patient presents with   Follow-up    Prod cough with white sputum, wheezing and SOB with exertion.   Dyspnea:  does own yard self propelled and weed eating baseline doe Cough: am congestion no better with prednisone / mucoid min vol with overt HB Sleeping: flat bed  one pillow  SABA use: 2 x daily  02: none  Covid status:  vax x 2    No obvious day to day or daytime variability or assoc excess/ purulent sputum or mucus plugs or hemoptysis or cp or chest tightness, subjective wheeze or overt sinus  symptoms.   Sleeping as above  without nocturnal  or early am exacerbation  of respiratory  c/o's or need for noct saba. Also denies any obvious fluctuation of  symptoms with weather or environmental changes or other aggravating or alleviating factors except as outlined above   No unusual exposure hx or h/o childhood pna/ asthma or knowledge of premature birth.  Current Allergies, Complete Past Medical History, Past Surgical History, Family History, and Social History were reviewed in [employer] record.  ROS  The following are not active complaints unless bolded Hoarseness, sore throat, dysphagia, dental problems, itching, sneezing,  nasal congestion or discharge of excess mucus or purulent secretions, ear ache,   fever, chills, sweats, unintended wt loss or wt gain, classically pleuritic or exertional cp,  orthopnea pnd or arm/hand swelling  or leg swelling, presyncope, palpitations, abdominal pain, anorexia, nausea, vomiting, diarrhea  or change in bowel habits or change in bladder habits, change in stools or change in urine, dysuria, hematuria,  rash, arthralgias, visual complaints, headache, numbness, weakness or ataxia or problems with walking or coordination,  change in mood or  memory.        Current Meds  Medication Sig   albuterol (VENTOLIN HFA) 108 (90 Base) MCG/ACT inhaler INHALE 2 PUFFS BY MOUTH EVERY 6 HOURS AS NEEDED FOR SHORTNESS OF BREATH OR WHEEZING.   amLODipine (NORVASC) 10 MG  tablet Take 1 tablet (10 mg total) by mouth daily.   aspirin 81 MG tablet Take 81 mg by mouth.   atorvastatin (LIPITOR) 40 MG tablet Take 1 tablet (40 mg total) by mouth daily.   B-D UF III MINI PEN NEEDLES 31G X 5 MM MISC    Budeson-Glycopyrrol-Formoterol (BREZTRI AEROSPHERE) 160-9-4.8 MCG/ACT AERO Inhale 2 puffs into the lungs 2 (two) times daily.   Dulaglutide 0.75 MG/0.5ML SOPN Inject 0.5 mg into the skin once a week.   glucose blood (ONETOUCH VERIO) test strip    insulin degludec (TRESIBA) 100 UNIT/ML FlexTouch Pen Inject into the skin.   losartan-hydrochlorothiazide (HYZAAR) 100-12.5 MG tablet Take 1 tablet by mouth daily.    metoprolol tartrate (LOPRESSOR) 100 MG tablet Take 1 tablet (100 mg total) by mouth 2 (two) times daily.   pantoprazole (PROTONIX) 20 MG tablet Take 1 tablet (20 mg total) by mouth daily.   pioglitazone (ACTOS) 15 MG tablet Take 15 mg by mouth daily.   Respiratory Therapy Supplies (NEBULIZER/TUBING/MOUTHPIECE) KIT Disp one nebulizer machine, tubing set and mouthpiece kit   sildenafil (VIAGRA) 100 MG tablet TAKE (1) TABLET BY MOUTH ONCE DAILY AS NEEDED.   [DISCONTINUED] Budeson-Glycopyrrol-Formoterol (BREZTRI AEROSPHERE) 160-9-4.8 MCG/ACT AERO Inhale 2 puffs into the lungs in the morning and at bedtime.        Past Medical History:  Diagnosis Date   COPD (chronic obstructive pulmonary disease) ([place])    Diabetes mellitus without complication (HCC)    Hyperlipidemia    Hypertension    OSA (obstructive sleep apnea)        Objective:     Wt Readings from Last 3 Encounters:  09/01/21 187 lb (84.8 kg)  08/18/21 185 lb 3.2 oz (84 kg)  06/02/21 182 lb 9.6 oz (82.8 kg)    Vital signs reviewed  09/01/2021  - Note at rest 02 sats  96% on RA   General appearance:    amb slt hoarse bm /  mild pseudowheeze    HEENT :  Oropharynx  clear   top dentures/ one remaining lower tooth  Nasal turbinates nl/   NECK :  without JVD/Nodes/TM/ nl carotid upstrokes bilaterally   LUNGS: no acc muscle use,  Mod barrel  contour chest wall with bilateral  Distant bs s audible wheeze and  without cough on insp or exp maneuvers and mod  Hyperresonant  to  percussion bilaterally     CV:  RRR  no s3 or murmur or increase in P2, and no edema   ABD:  soft and nontender with pos mid insp Hoover's  in the supine position. No bruits or organomegaly appreciated, bowel sounds nl  MS:   Ext warm without deformities or   obvious joint restrictions , calf tenderness, cyanosis or clubbing  SKIN: warm and dry without lesions    NEURO:  alert, approp, nl sensorium with  no motor or cerebellar deficits apparent.                   I personally reviewed images and agree with radiology impression as follows:   Chest LDSCT  04/19/21  1. Lung-RADS 2, benign appearance or behavior. Continue annual screening with low-dose chest CT without contrast in 12 months. 2.  Emphysema (ICD10-J43.9) and Aortic Atherosclerosis (ICD10-170.0)  Assessment

## 2021-09-05 ENCOUNTER — Other Ambulatory Visit: Payer: Self-pay | Admitting: Family Medicine

## 2021-09-05 DIAGNOSIS — J42 Unspecified chronic bronchitis: Secondary | ICD-10-CM

## 2021-09-20 ENCOUNTER — Other Ambulatory Visit: Payer: Self-pay | Admitting: Internal Medicine

## 2021-09-20 DIAGNOSIS — I10 Essential (primary) hypertension: Secondary | ICD-10-CM

## 2021-09-20 NOTE — Telephone Encounter (Signed)
Requested Prescriptions  Pending Prescriptions Disp Refills  . losartan-hydrochlorothiazide (HYZAAR) 100-12.5 MG tablet [Pharmacy Med Name: LOSARTAN-HCTZ 100-12.5 MG TAB] 90 tablet 0    Sig: TAKE 1 TABLET BY MOUTH ONCE DAILY.     Cardiovascular: ARB + Diuretic Combos Failed - 09/20/2021  9:31 AM      Failed - K in normal range and within 180 days    Potassium  Date Value Ref Range Status  02/23/2021 4.4 3.5 - 5.3 mmol/L Final         Failed - Na in normal range and within 180 days    Sodium  Date Value Ref Range Status  02/23/2021 138 135 - 146 mmol/L Final         Failed - Cr in normal range and within 180 days    Creat  Date Value Ref Range Status  02/23/2021 1.68 (H) 0.70 - 1.35 mg/dL Final   Creatinine, Urine  Date Value Ref Range Status  08/14/2017 81 20 - 320 mg/dL Final         Failed - eGFR is 10 or above and within 180 days    GFR, Est African American  Date Value Ref Range Status  02/29/2020 40 (L) > OR = 60 mL/min/1.85m Final   GFR, Est Non African American  Date Value Ref Range Status  02/29/2020 34 (L) > OR = 60 mL/min/1.75mFinal   eGFR  Date Value Ref Range Status  02/23/2021 45 (L) > OR = 60 mL/min/1.7342minal    Comment:    The eGFR is based on the CKD-EPI 2021 equation. To calculate  the new eGFR from a previous Creatinine or Cystatin C result, go to https://www.kidney.org/professionals/ kdoqi/gfr%5Fcalculator          Passed - Patient is not pregnant      Passed - Last BP in normal range    BP Readings from Last 1 Encounters:  09/01/21 120/80         Passed - Valid encounter within last 6 months    Recent Outpatient Visits          [facility]   [facility]   [facility]   6 months ago Essential hypertension   [facility]   1 year ago Routine general medical examination at a health care facility   [facility]      Future Appointments            In 5 days [facility] WerMelvyn NovasicChristena DeemD [facility]

## 2021-09-22 NOTE — Progress Notes (Unsigned)
Acute Office Visit  Subjective:     Patient ID: Zachary Wallace, male    DOB: February 09, 1956, 66 y.o.   MRN: [id number]  No chief complaint on file.   HPI Patient is in today for follow up on recent COPD exacerbation and abdominal pain.    COPD: -COPD status: exacerbated -Current medications: Breztri daily, albuterol 3 times daily -Satisfied with current treatment?: no -Oxygen use: no -Dyspnea frequency: Daily with activities -Cough frequency: Frequently, worsening productive cough with thick white mucus -Rescue inhaler frequency: 3 times daily -Limitation of activity: yes -Productive cough: Yes -Last Spirometry/PFTs: Yesterday, results pending -Pneumovax: Up to Date -Influenza: Up to Date -Seen on 5/26 for COPD exacerbation, treated with Prednisone 40 mg x 5 days. Augmentin BID x 5 days   GERD:  -Had also been complaining of acid reflux with burning type epigastric pain -Denies nausea or vomiting, however appetite is decreased.  Weight stable. -Currently taking Tums as needed - started on Protonix 20 mg at LOV today he states that   Review of Systems  Constitutional:  Negative for chills and fever.  HENT:  Negative for congestion, ear pain, sinus pain and sore throat.   Respiratory:  Positive for cough, sputum production, shortness of breath and wheezing.   Cardiovascular:  Negative for chest pain.  Gastrointestinal:  Positive for abdominal pain and heartburn. Negative for constipation, diarrhea, nausea and vomiting.  Genitourinary:  Negative for dysuria and hematuria.        Objective:    There were no vitals taken for this visit. BP Readings from Last 3 Encounters:  09/01/21 120/80  08/18/21 118/82  06/02/21 132/82   Wt Readings from Last 3 Encounters:  09/01/21 187 lb (84.8 kg)  08/18/21 185 lb 3.2 oz (84 kg)  06/02/21 182 lb 9.6 oz (82.8 kg)      Physical Exam Constitutional:      Appearance: Normal appearance.  HENT:     Head: Normocephalic and  atraumatic.     Right Ear: Ear canal and external ear normal. There is impacted cerumen.     Left Ear: Ear canal and external ear normal.     Nose: Nose normal.     Mouth/Throat:     Mouth: Mucous membranes are moist.     Pharynx: Oropharynx is clear.  Eyes:     Conjunctiva/sclera: Conjunctivae normal.  Cardiovascular:     Rate and Rhythm: Normal rate and regular rhythm.  Pulmonary:     Effort: Pulmonary effort is normal.     Breath sounds: Wheezing present.     Comments: Inspiratory wheezes throughout with decreased air movement, no rhonchi auscultated Abdominal:     General: Bowel sounds are normal. There is no distension.     Palpations: Abdomen is soft.     Tenderness: There is no abdominal tenderness. There is no guarding or rebound.  Musculoskeletal:     Right lower leg: No edema.     Left lower leg: No edema.  Skin:    General: Skin is warm and dry.  Neurological:     General: No focal deficit present.     Mental Status: He is alert. Mental status is at baseline.  Psychiatric:        Mood and Affect: Mood normal.        Behavior: Behavior normal.     No results found for any visits on 09/25/21.      Assessment & Plan:   1. COPD with acute exacerbation ([place]):  Treat with prednisone 40 mg x 5 days as well as Augmentin 875 mg twice daily x5 days for COPD exacerbation.  Continue Breztri and albuterol as needed.  He does have follow-up with his pulmonologist scheduled for 09/01/21.  He did undergo an updated pulmonary function test yesterday, results pending. He is a diabetic, follows with Endocrinology, last A1c 6.6%. Discussed how steroids will increase sugars, monitor closely.   - predniSONE (DELTASONE) 20 MG tablet; Take 1 tablet (20 mg total) by mouth 2 (two) times daily with a meal for 5 days.  Dispense: 10 tablet; Refill: 0 - amoxicillin-clavulanate (AUGMENTIN) 875-125 MG tablet; Take 1 tablet by mouth 2 (two) times daily for 5 days.  Dispense: 10 tablet; Refill:  0  2. Gastroesophageal reflux disease, unspecified whether esophagitis present: Discussed lifestyle changes, such as decreasing acidic and spicy foods and elevating the bed after eating.  Will treat with Protonix 20 mg. Follow-up scheduled for 2 weeks.  - pantoprazole (PROTONIX) 20 MG tablet; Take 1 tablet (20 mg total) by mouth daily.  Dispense: 90 tablet; Refill: 0   No follow-ups on file.  Teodora Medici, DO

## 2021-09-25 ENCOUNTER — Encounter: Payer: Self-pay | Admitting: Internal Medicine

## 2021-09-25 ENCOUNTER — Ambulatory Visit: Payer: BC Managed Care – PPO | Admitting: Internal Medicine

## 2021-09-25 VITALS — BP 136/84 | HR 79 | Temp 98.3°F | Resp 16 | Ht 68.0 in | Wt 183.3 lb

## 2021-09-25 DIAGNOSIS — E1122 Type 2 diabetes mellitus with diabetic chronic kidney disease: Secondary | ICD-10-CM

## 2021-09-25 DIAGNOSIS — I10 Essential (primary) hypertension: Secondary | ICD-10-CM

## 2021-09-25 DIAGNOSIS — Z23 Encounter for immunization: Secondary | ICD-10-CM

## 2021-09-25 DIAGNOSIS — J449 Chronic obstructive pulmonary disease, unspecified: Secondary | ICD-10-CM

## 2021-09-25 DIAGNOSIS — K219 Gastro-esophageal reflux disease without esophagitis: Secondary | ICD-10-CM

## 2021-09-25 DIAGNOSIS — F17200 Nicotine dependence, unspecified, uncomplicated: Secondary | ICD-10-CM

## 2021-09-25 DIAGNOSIS — N1832 Chronic kidney disease, stage 3b: Secondary | ICD-10-CM

## 2021-09-25 DIAGNOSIS — Z794 Long term (current) use of insulin: Secondary | ICD-10-CM

## 2021-09-25 DIAGNOSIS — E782 Mixed hyperlipidemia: Secondary | ICD-10-CM

## 2021-09-25 MED ORDER — LOSARTAN POTASSIUM-HCTZ 100-12.5 MG PO TABS: 1.0000 | ORAL_TABLET | Freq: Every day | ORAL | 1 refills | Status: DC

## 2021-09-25 MED ORDER — NICOTINE 14 MG/24HR TD PT24: 14.0000 mg | MEDICATED_PATCH | Freq: Every day | TRANSDERMAL | 1 refills | Status: DC

## 2021-09-25 NOTE — Assessment & Plan Note (Signed)
Reviewed recent PFT's from 08/17/21. He was given a Research scientist (physical sciences). Oxygen saturation 89% when he first walked in, improved to 92% at rest on RA. Following with Pulmonology, note from 09/01/21 reviewed. Discussed tobacco cessation at length, nicotine patches sent to pharmacy.

## 2021-09-25 NOTE — Assessment & Plan Note (Signed)
Stable, continue Lipitor 40 mg. Plan to recheck lipid panel at follow up.

## 2021-09-25 NOTE — Assessment & Plan Note (Signed)
Stable, continue regimen of Amlodipine 10, Losartan-HCTZ 100-12.5 mg, Metoprolol 100 mg BID. Losartan-HCTZ refilled today. Recommend obtaining a blood pressure cuff and monitoring at home.

## 2021-09-25 NOTE — Assessment & Plan Note (Signed)
Following with Endocrinology at [facility], note from 04/24/21 reviewed. Recent A1c increased slightly to 7.1%, continue Tresiba 28 units and Trulicity 1.10 weekly.

## 2021-09-25 NOTE — Patient Instructions (Signed)
It was great seeing you today!  Plan discussed at today's visit: -Medications refilled -Tdap vaccine administered today -Work on quitting smoking, nicotine patches sent to pharmacy   Follow up in: 3 months   Take care and let us know if you have any questions or concerns prior to your next visit.  Dr. Rosana Berger

## 2021-11-02 ENCOUNTER — Other Ambulatory Visit: Payer: Self-pay

## 2021-11-02 DIAGNOSIS — J42 Unspecified chronic bronchitis: Secondary | ICD-10-CM

## 2021-11-02 MED ORDER — ALBUTEROL SULFATE HFA 108 (90 BASE) MCG/ACT IN AERS: 1.0000 | INHALATION_SPRAY | Freq: Four times a day (QID) | RESPIRATORY_TRACT | 0 refills | Status: DC | PRN

## 2021-11-17 ENCOUNTER — Ambulatory Visit: Payer: BC Managed Care – PPO | Admitting: Internal Medicine

## 2021-11-17 ENCOUNTER — Ambulatory Visit
Admission: RE | Admit: 2021-11-17 | Discharge: 2021-11-17 | Disposition: A | Discharge status: Home / Self Care | Payer: BC Managed Care – PPO | Source: Ambulatory Visit | Attending: Internal Medicine | Admitting: Internal Medicine

## 2021-11-17 ENCOUNTER — Encounter: Payer: Self-pay | Admitting: Internal Medicine

## 2021-11-17 DIAGNOSIS — I10 Essential (primary) hypertension: Secondary | ICD-10-CM

## 2021-11-17 DIAGNOSIS — J449 Chronic obstructive pulmonary disease, unspecified: Secondary | ICD-10-CM | POA: Diagnosis present

## 2021-11-17 DIAGNOSIS — F17210 Nicotine dependence, cigarettes, uncomplicated: Secondary | ICD-10-CM | POA: Diagnosis not present

## 2021-11-17 MED ORDER — BISOPROLOL FUMARATE 5 MG PO TABS: ORAL_TABLET | ORAL | 11 refills | Status: DC

## 2021-11-17 MED ORDER — METHYLPREDNISOLONE ACETATE 80 MG/ML IJ SUSP
120.0000 mg | Freq: Once | INTRAMUSCULAR | Status: AC
  Administered 2021-11-17: 120 mg via INTRAMUSCULAR

## 2021-11-17 MED ORDER — PREDNISONE 10 MG PO TABS: ORAL_TABLET | ORAL | 0 refills | Status: DC

## 2021-11-17 NOTE — Progress Notes (Signed)
Zachary Wallace, male    DOB: 16-Nov-1955  MRN: [id number]   Brief patient profile:  82  yobm active smoker reported dx of asthma in his 20s referred to pulmonary clinic in [facility]  06/02/2021 by  Delsa Grana PA at [facility] for copd eval p exac 04/07/21 and placed on breztri which has helped despite poor hfa technique      History of Present Illness  06/02/2021  Pulmonary/ 1st office eval/ Zachary Wallace / [employer] / maint on [employer] Complaint  Patient presents with   Consult  Dyspnea:  can do front yard behind a self propelled mower s stopping x 30  Cough: after lies down but does fine flat and no am flare > white mucus  Sleep: one pillow  Rec Plan A = Automatic = Always=    Breztri Take 2 puffs first thing in am and then another 2 puffs about 12 hours later.  Work on inhaler technique:   Plan B = Backup (to supplement plan A, not to replace it) Only use your albuterol inhaler as a rescue medication Plan C = Crisis (instead of Plan B but only if Plan B stops working) - only use your albuterol nebulizer if you first try Plan B  Please schedule a follow up visit in 3 months but call sooner if needed with pfts on return     09/01/2021  f/u ov/Zayana Wallace/ [facility] re: GOLD 3  maint on breztri   Chief Complaint  Patient presents with   Follow-up    Prod cough with white sputum, wheezing and SOB with exertion.   Dyspnea:  does own yard self propelled and weed eating baseline doe Cough: am congestion no better with prednisone / mucoid min vol with overt HB Sleeping: flat bed  one pillow  SABA use: 2 x daily  02: none  Covid status:  vax x 2  Rec Plan A = Automatic = Always=    Breztri Take 2 puffs first thing in am and then another 2 puffs about 12 hours later.   Work on inhaler technique:  >>> remember how golfers warm by taking practice swings  Plan B = Backup (to supplement plan A, not to replace it) Only use your albuterol inhaler as a rescue medication Plan C =  Crisis (instead of Plan B but only if Plan B stops working) - only use your albuterol nebulizer if you first try Plan B   Also ok to [facility] to try albuterol 15 min before an activity (on alternating days)  that you know would usually make you short of breath For cough / congestion >  mucinex dm 1200 mg every 12 hours   No mint menthol or chocolate products   Pantoprazole (protonix) 40 mg   Take  30-60 min before first meal of the day and Pepcid (famotidine)  20 mg after supper until return to office - this is the best way to tell whether stomach acid is contributing to your problem.    Please schedule a follow up office visit in 6 weeks, call sooner if needed - bring both inhalers     11/17/2021  f/u ov/Zachary Wallace/ [facility] re: aecopd    maint on breztri 2bid  / bid albuterol then much worse x one week, better p last prednisone rx  Chief Complaint  Patient presents with   Follow-up  Dyspnea: gradually worse off pred  to point of  doe x 50 ft assoc with chest  tight better p neb / chest soreness ant p coughing fits  Cough: mucus is just whte, more nasal congestion  Sleeping: flat in bed, one pillow up and down nightly requiring saba which wasn't the case on prednisone   SABA use:  neb x 3 per day since flare  02: none  Covid status:   vax x 2 never infected     No obvious day to day or daytime variability or assoc   purulent sputum or mucus plugs or hemoptysis or  subjective wheeze or overt sinus or hb symptoms.    Also denies any obvious fluctuation of symptoms with weather or environmental changes or other aggravating or alleviating factors except as outlined above   No unusual exposure hx or h/o childhood pna/ asthma or knowledge of premature birth.  Current Allergies, Complete Past Medical History, Past Surgical History, Family History, and Social History were reviewed in [employer] record.  ROS  The following are not active complaints unless  bolded Hoarseness, sore throat, dysphagia, dental problems, itching, sneezing,  nasal congestion or discharge of excess mucus or purulent secretions, ear ache,   fever, chills, sweats, unintended wt loss or wt gain, classically pleuritic or exertional cp,  orthopnea pnd or arm/hand swelling  or leg swelling, presyncope, palpitations, abdominal pain, anorexia, nausea, vomiting, diarrhea  or change in bowel habits or change in bladder habits, change in stools or change in urine, dysuria, hematuria,  rash, arthralgias, visual complaints, headache, numbness, weakness or ataxia or problems with walking or coordination,  change in mood or  memory.        Current Meds  Medication Sig   albuterol (VENTOLIN HFA) 108 (90 Base) MCG/ACT inhaler Inhale 1-2 puffs into the lungs every 6 (six) hours as needed for wheezing or shortness of breath.   amLODipine (NORVASC) 10 MG tablet Take 1 tablet (10 mg total) by mouth daily.   aspirin 81 MG tablet Take 81 mg by mouth.   atorvastatin (LIPITOR) 40 MG tablet Take 1 tablet (40 mg total) by mouth daily.   B-D UF III MINI PEN NEEDLES 31G X 5 MM MISC    Budeson-Glycopyrrol-Formoterol (BREZTRI AEROSPHERE) 160-9-4.8 MCG/ACT AERO Inhale 2 puffs into the lungs 2 (two) times daily.   Dulaglutide 0.75 MG/0.5ML SOPN Inject 0.5 mg into the skin once a week.   glucose blood (ONETOUCH VERIO) test strip    insulin degludec (TRESIBA) 100 UNIT/ML FlexTouch Pen Inject into the skin.   losartan-hydrochlorothiazide (HYZAAR) 100-12.5 MG tablet Take 1 tablet by mouth daily.   metoprolol tartrate (LOPRESSOR) 100 MG tablet Take 1 tablet (100 mg total) by mouth 2 (two) times daily.   nicotine (NICODERM CQ - DOSED IN MG/24 HOURS) 14 mg/24hr patch Place 1 patch (14 mg total) onto the skin daily.   pantoprazole (PROTONIX) 40 MG tablet Take 1 tablet (40 mg total) by mouth daily. Take 30-60 min before first meal of the day   Respiratory Therapy Supplies (NEBULIZER/TUBING/MOUTHPIECE) KIT Disp one  nebulizer machine, tubing set and mouthpiece kit   sildenafil (VIAGRA) 100 MG tablet TAKE (1) TABLET BY MOUTH ONCE DAILY AS NEEDED.               Past Medical History:  Diagnosis Date   COPD (chronic obstructive pulmonary disease) ([place])    Diabetes mellitus without complication (HCC)    Hyperlipidemia    Hypertension    OSA (obstructive sleep apnea)        Objective:  11/17/2021       177   09/01/21 187 lb (84.8 kg)  08/18/21 185 lb 3.2 oz (84 kg)  06/02/21 182 lb 9.6 oz (82.8 kg)    Vital signs reviewed  11/17/2021  - Note at rest 02 sats  92% on  RA   General appearance:    amb bm nad at rest    HEENT :  Oropharynx  clear   Nasal turbinates nl    NECK :  without JVD/Nodes/TM/ nl carotid upstrokes bilaterally   LUNGS: no acc muscle use,  Mod barrel  contour chest wall with bilateral  Distant pan exp   wheeze and  without cough on insp or exp maneuvers and mod  Hyperresonant  to  percussion bilaterally     CV:  RRR  no s3 or murmur or increase in P2, and no edema   ABD:  soft and nontender with pos mid insp Hoover's  in the supine position. No bruits or organomegaly appreciated, bowel sounds nl  MS:   Ext warm without deformities or   obvious joint restrictions , calf tenderness, cyanosis or clubbing  SKIN: warm and dry without lesions    NEURO:  alert, approp, nl sensorium with  no motor or cerebellar deficits apparent.          CXR PA and Lateral:   11/17/2021 :    I personally reviewed images and impression is as follows:     Mod hyperinflation s ptx/ no air space dz      Assessment

## 2021-11-17 NOTE — Assessment & Plan Note (Addendum)
Active smoker - emphysema noted on chest CT Dec 2018 - PFT's  08/13/17  FEV1 0.66  (22 % ) ratio 0.26  p 13 % improvement from saba p ? nothing prior to study with DLCO  10.3 (44%)  and FV curve classic concavity   - PFT's  08/17/21   FEV1 0.95 (32 % ) ratio 0.41  p 6 % improvement from saba p breztri prior to study with DLCO  11.9 (42%)  FV curve classically convcave - 09/01/2021   continue breztri plus approp saba and short term gerd rx due to upper airway wheeze on exam > f/u in 6 weeks  DDX of  difficult airways management almost all start with A and  include Adherence, Ace Inhibitors, Acid Reflux, Active Sinus Disease, Alpha 1 Antitripsin deficiency, Anxiety masquerading as Airways dz,  ABPA,  Allergy(esp in young), Aspiration (esp in elderly), Adverse effects of meds,  Active smoking or vaping, A bunch of PE's (a small clot burden can't cause this syndrome unless there is already severe underlying pulm or vascular dz with poor reserve) plus two Bs  = Bronchiectasis and Beta blocker use..and one C= CHF   with all meds in hand using a trust but verify approach to confirm accurate Medication  Reconciliation The principal here is that until we are certain that the  patients are doing what we've asked, it makes no sense to ask them to do more.  >>> - The proper method of use, as well as anticipated side effects, of a metered-dose inhaler were discussed and demonstrated to the patient using teach back method. Improved effectiveness after extensive coaching during this visit to a level of approximately 75 % from a baseline of < 25 % so continue breztri and approp saba and bring both back next ov  ? Allergy/ asthma > depomedrol 120 mg IM/  prednisone x Take 4 for two days three for two days two for two days one for two days / continue high dose ICS I form of breztri   ? Acid (or non-acid) GERD > always difficult to exclude as up to 75% of pts in some series report no assoc GI/ Heartburn symptoms> rec max  (24h)  acid suppression and diet restrictions/ reviewed and instructions given in writing.   Active smoking > see sep a/p  ? Beta blocker effect > see HBP/ change to bisoprolol

## 2021-11-17 NOTE — Patient Instructions (Addendum)
Depomedrol 120 mg IM   Stop metaprolol and replace with bisoprolol 5 mg  one tablet twice daily-  can use 2 twice daily if Blood pressure too high  Prednisone  10  Take 4 for two days three for two days two for two days one for two days   Work on inhaler technique:  relax and gently blow all the way out then take a nice smooth full deep breath back in, triggering the inhaler at same time you start breathing in.  Hold breath in for at least  5 seconds if you can. Blow out breztri thru nose. Rinse and gargle with water when done.  If mouth or throat bother you at all,  try brushing teeth/gums/tongue with arm and hammer toothpaste/ make a slurry and gargle and spit out.   Ok to try albuterol 15 min before an activity (on alternating days)  that you know would usually make you short of breath and see if it makes any difference and if makes none then don't take albuterol after activity unless you can't catch your breath as this means it's the resting that helps, not the albuterol.  The key is to stop smoking completely before smoking completely stops you!     Please remember to go to the  x-ray department  for your tests - we will call you with the results when they are available     Please schedule a follow up office visit in 6 weeks, call sooner if needed

## 2021-11-17 NOTE — Assessment & Plan Note (Signed)
Counseled re importance of smoking cessation but did not meet time criteria for separate billing   °

## 2021-11-17 NOTE — Assessment & Plan Note (Signed)
In the setting of respiratory symptoms of unknown etiology,  It would be preferable to use bystolic, the most beta -1  selective Beta blocker available in sample form, with bisoprolol the most selective generic choice  on the market, at least on a trial basis, to make sure the spillover Beta 2 effects of the less specific Beta blockers are not contributing to this patient's symptoms.  >> try bisoprolol 5 mg bid but ok to increased to 10 mg bid to replace high dose metaprolol   Discussed in detail all the  indications, usual  risks and alternatives  relative to the benefits with patient who agrees to proceed with Rx as outlined.             Each maintenance medication was reviewed in detail including emphasizing most importantly the difference between maintenance and prns and under what circumstances the prns are to be triggered using an action plan format where appropriate.  Total time for H and P, chart review, counseling, reviewing hfa/ neb device(s) and generating customized AVS unique to this office visit / same day charting =  34 min

## 2021-11-21 ENCOUNTER — Telehealth: Payer: Self-pay | Admitting: Internal Medicine

## 2021-11-21 NOTE — Progress Notes (Signed)
ATC x1.  LVM to return call. 

## 2021-11-21 NOTE — Telephone Encounter (Signed)
Tanda Rockers, MD  Vanessa Barbara, RN Call pt:  Reviewed cxr and no acute change so no change in recommendations made at [facility]   Patient is aware of results and voiced his understanding.  Nothing further needed.

## 2021-11-21 NOTE — Telephone Encounter (Signed)
Patient is returning a call regarding his x-ray results.  Please call patient back to discuss at [phone]

## 2021-12-04 ENCOUNTER — Telehealth: Payer: Self-pay | Admitting: Internal Medicine

## 2021-12-04 DIAGNOSIS — J449 Chronic obstructive pulmonary disease, unspecified: Secondary | ICD-10-CM

## 2021-12-04 MED ORDER — ALBUTEROL SULFATE (2.5 MG/3ML) 0.083% IN NEBU: 2.5000 mg | INHALATION_SOLUTION | Freq: Four times a day (QID) | RESPIRATORY_TRACT | 1 refills | Status: DC | PRN

## 2021-12-04 NOTE — Telephone Encounter (Signed)
Please advise 

## 2021-12-04 NOTE — Addendum Note (Signed)
Addended by: Teodora Medici on: 12/04/2021 02:23 PM   Modules accepted: Orders

## 2021-12-04 NOTE — Telephone Encounter (Signed)
[place] requested:  Medication [facility] Prescription # DAW  ipratropium 0.5 mg-albuterol 3 mg (2.5 mg base)/3 mL nebulization soln [id number] No  Written Date Last Fill Date Quantity Days Supply  03/29/2021 03/29/2021 180 mL 20  Sig Notes  USE 1 VIAL IN NEBULIZER 3 TIMES A DAY AS NEEDED. Not Available

## 2021-12-07 ENCOUNTER — Ambulatory Visit: Payer: Self-pay | Admitting: *Deleted

## 2021-12-07 ENCOUNTER — Other Ambulatory Visit: Payer: Self-pay | Admitting: Internal Medicine

## 2021-12-07 DIAGNOSIS — J42 Unspecified chronic bronchitis: Secondary | ICD-10-CM

## 2021-12-07 NOTE — Telephone Encounter (Signed)
I called into [facility] and spoke with Melissa.   They did get the fax for the albuterol inhaler.  "I'll see to it they get it".    I let her know the pt was waiting.

## 2021-12-07 NOTE — Telephone Encounter (Signed)
Reason for Disposition  [1] Pharmacy calling with prescription question AND [2] triager unable to answer question  Answer Assessment - Initial Assessment Questions 1. NAME of MEDICINE: "What medicine(s) are you calling about?"     Tonya with [facility] calling to inquire about the albuterol inhaler.   They have sent 3 requests and haven't heard anything from the practice.   They have recently switched to a new computer system so not sure if the requests were going through. 2. QUESTION: "What is your question?" (e.g., double dose of medicine, side effect)     Tonya just sent the rx again to the fax number.   Wanting to know if we received it and could it be refilled.   The pt is there at the pharmacy waiting.   He has been there 3 days in a row to get the albuterol inhaler.   He wants to wait for it this time. 3. PRESCRIBER: "Who prescribed the medicine?" Reason: if prescribed by specialist, call should be referred to that group.     Dr. Teodora Medici 4. SYMPTOMS: "Do you have any symptoms?" If Yes, ask: "What symptoms are you having?"  "How bad are the symptoms (e.g., mild, moderate, severe)     N/A 5. PREGNANCY:  "Is there any chance that you are pregnant?" "When was your last menstrual period?"     N/A  Protocols used: Medication Question Call-A-AH

## 2021-12-08 ENCOUNTER — Other Ambulatory Visit: Payer: Self-pay

## 2021-12-08 ENCOUNTER — Telehealth: Payer: Self-pay | Admitting: Internal Medicine

## 2021-12-08 DIAGNOSIS — J42 Unspecified chronic bronchitis: Secondary | ICD-10-CM

## 2021-12-08 MED ORDER — ALBUTEROL SULFATE HFA 108 (90 BASE) MCG/ACT IN AERS: 1.0000 | INHALATION_SPRAY | Freq: Four times a day (QID) | RESPIRATORY_TRACT | 5 refills | Status: DC | PRN

## 2021-12-08 NOTE — Telephone Encounter (Signed)
[facility] [employer] called and spoke to [place], [facility] about the refill(s) albuterol inhaler requested. Advised it was sent on 11/02/21. She says they never received the Rx on 11/02/21, the only one showing is a denied saying refill not appropriate. Will resend refill today.

## 2021-12-08 NOTE — Telephone Encounter (Signed)
Requested Prescriptions  Pending Prescriptions Disp Refills  . albuterol (VENTOLIN HFA) 108 (90 Base) MCG/ACT inhaler 18 g 3    Sig: Inhale 1-2 puffs into the lungs every 6 (six) hours as needed for wheezing or shortness of breath.     Pulmonology:  Beta Agonists 2 Passed - 12/08/2021 10:49 AM      Passed - Last BP in normal range    BP Readings from Last 1 Encounters:  11/17/21 120/80         Passed - Last Heart Rate in normal range    Pulse Readings from Last 1 Encounters:  11/17/21 (!) 58         Passed - Valid encounter within last 12 months    Recent Outpatient Visits          2 months ago COPD GOLD 3   / still smoking    [place], DO   3 months ago COPD with acute exacerbation [facility])   [place], DO   7 months ago COPD with acute exacerbation [facility])   [place], FNP   8 months ago COPD with acute exacerbation [facility])   [facility] Steele Sizer, MD   9 months ago Essential hypertension   [facility] Teodora Medici, DO      Future Appointments            In 2 weeks Teodora Medici, DO [facility], [place]   In 1 month Melvyn Novas, Christena Deem, MD [place]

## 2021-12-08 NOTE — Telephone Encounter (Signed)
Patient called in stating the refill for his albuterol had not been called in yet as of yesterday and he is down to 12 sprays. He is anxious because he doesn't want to be without it if this one runs out. He states he has not contacted his pharmacy as of this morning yet and will give them a call in a little while. Please assist patient further    [place], [place] Phone:  [phone]  Fax:  [phone]

## 2021-12-14 ENCOUNTER — Ambulatory Visit: Payer: BC Managed Care – PPO | Admitting: Internal Medicine

## 2021-12-14 ENCOUNTER — Encounter: Payer: Self-pay | Admitting: Internal Medicine

## 2021-12-14 DIAGNOSIS — F17210 Nicotine dependence, cigarettes, uncomplicated: Secondary | ICD-10-CM | POA: Diagnosis not present

## 2021-12-14 DIAGNOSIS — I10 Essential (primary) hypertension: Secondary | ICD-10-CM | POA: Diagnosis not present

## 2021-12-14 DIAGNOSIS — J449 Chronic obstructive pulmonary disease, unspecified: Secondary | ICD-10-CM | POA: Diagnosis not present

## 2021-12-14 MED ORDER — FAMOTIDINE 20 MG PO TABS: ORAL_TABLET | ORAL | 11 refills | Status: DC

## 2021-12-14 MED ORDER — METHYLPREDNISOLONE ACETATE 80 MG/ML IJ SUSP
120.0000 mg | Freq: Once | INTRAMUSCULAR | Status: AC
  Administered 2021-12-14: 120 mg via INTRAMUSCULAR

## 2021-12-14 MED ORDER — ACETAMINOPHEN-CODEINE 300-30 MG PO TABS: 1.0000 | ORAL_TABLET | ORAL | 0 refills | Status: AC | PRN

## 2021-12-14 NOTE — Assessment & Plan Note (Addendum)
Active smoker - emphysema noted on chest CT Dec 2018 - PFT's  08/13/17  FEV1 0.66  (22 % ) ratio 0.26  p 13 % improvement from saba p ? nothing prior to study with DLCO  10.3 (44%)  and FV curve classic concavity   - PFT's  08/17/21   FEV1 0.95 (32 % ) ratio 0.41  p 6 % improvement from saba p breztri prior to study with DLCO  11.9 (42%)  FV curve classically convcave - 09/01/2021   continue breztri plus approp saba and short term gerd rx due to upper airway wheeze on exam    Refractory cough and sob c/w DDX of  difficult airways management almost all start with A and  include Adherence, Ace Inhibitors, Acid Reflux, Active Sinus Disease, Alpha 1 Antitripsin deficiency, Anxiety masquerading as Airways dz,  ABPA,  Allergy(esp in young), Aspiration (esp in elderly), Adverse effects of meds,  Active smoking or vaping, A bunch of PE's (a small clot burden can't cause this syndrome unless there is already severe underlying pulm or vascular dz with poor reserve) plus two Bs  = Bronchiectasis and Beta blocker use..and one C= CHF   Adherence is always the initial "prime suspect" and is a multilayered concern that requires a "trust but verify" approach in every patient - starting with knowing how to use medications, especially inhalers, correctly, keeping up with refills and understanding the fundamental difference between maintenance and prns vs those medications only taken for a very short course and then stopped and not refilled.  - 12/14/2021  After extensive coaching inhaler device,  effectiveness =    75% (short ti) - return with all meds in hand using a trust but verify approach to confirm accurate Medication  Reconciliation The principal here is that until we are certain that the  patients are doing what we've asked, it makes no sense to ask them to do more.   ? Acid (or non-acid) GERD > always difficult to exclude as up to 75% of pts in some series report no assoc GI/ Heartburn symptoms> rec max (24h)  acid  suppression and diet restrictions/ reviewed and instructions given in writing.   ? Allergy/asthma > depomedrol 120 mg IM   Active smoking  (see separate a/p)   ? BB effects > very unlikely on very low dose so bisprolol   F/u in 4 weeks, call sooner if needed

## 2021-12-14 NOTE — Assessment & Plan Note (Signed)
Although even in retrospect it may not be clear the non-specific Beta blockers contributed to the pt's symptoms,   adding them back at this point or in the future would risk confusion in interpretation of non-specific respiratory symptoms to which this patient is prone  ie  Better not to muddy the waters here.   >>> continue bisoprolol 5 mg q d         Each maintenance medication was reviewed in detail including emphasizing most importantly the difference between maintenance and prns and under what circumstances the prns are to be triggered using an action plan format where appropriate.  Total time for H and P, chart review, counseling, reviewing hfa/neb device(s) and generating customized AVS unique to this office visit / same day charting   > 30 min for multiple  refractory respiratory  symptoms of uncertain etiology

## 2021-12-14 NOTE — Addendum Note (Signed)
Addended by: Vanessa Barbara on: 12/14/2021 03:30 PM   Modules accepted: Orders

## 2021-12-14 NOTE — Assessment & Plan Note (Signed)
Counseled re importance of smoking cessation but did not meet time criteria for separate billing   °

## 2021-12-14 NOTE — Patient Instructions (Addendum)
Depomedrol 120 mg IM   Take mucinex dm 1200 mg  every 12 hours and supplement if needed with  Tylenol #3   up to 1-2 every 4 hours to suppress the urge to cough. Swallowing water and/or using ice chips/non mint and menthol containing candies (such as lifesavers or sugarless jolly ranchers) are also effective.  You should rest your voice and avoid activities that you know make you cough.  Once you have eliminated the cough for 3 straight days try reducing the Tylenol #3 first,  then the Mucinex dm as tolerated.     Be sure taking Pantoprazole (protonix) 40 mg   Take  30-60 min before first meal of the day and add Pepcid (famotidine)  20 mg after supper until return to office - this is the best way to tell whether stomach acid is contributing to your problem.    Work on inhaler technique:  relax and gently blow all the way out then take a nice smooth full deep breath back in, triggering the inhaler at same time you start breathing in.  Hold breath in for at least  5 seconds if you can. Blow out breztri  thru nose. Rinse and gargle with water when done.  If mouth or throat bother you at all,  try brushing teeth/gums/tongue with arm and hammer toothpaste/ make a slurry and gargle and spit out.    The key is to stop smoking completely before smoking completely stops you!       Keep prior appt - bring all medications with you

## 2021-12-14 NOTE — Progress Notes (Signed)
Zachary Wallace, male    DOB: Jun 15, 1955  MRN: [id number]   Brief patient profile:  78  yobm active smoker reported dx of asthma in his 63s referred to pulmonary clinic in [facility]  06/02/2021 by  Delsa Grana PA at [facility] for copd eval p exac 04/07/21 and placed on breztri which has helped despite poor hfa technique    History of Present Illness  06/02/2021  Pulmonary/ 1st office eval/ Zachary Wallace / [place] / maint on [employer] Complaint  Patient presents with   Consult  Dyspnea:  can do front yard behind a self propelled mower s stopping x 30  Cough: after lies down but does fine flat and no am flare > white mucus  Sleep: one pillow  Rec Plan A = Automatic = Always=    Breztri Take 2 puffs first thing in am and then another 2 puffs about 12 hours later.  Work on inhaler technique:   Plan B = Backup (to supplement plan A, not to replace it) Only use your albuterol inhaler as a rescue medication Plan C = Crisis (instead of Plan B but only if Plan B stops working) - only use your albuterol nebulizer if you first try Plan B  Please schedule a follow up visit in 3 months but call sooner if needed with pfts on return     09/01/2021  f/u ov/Zachary Wallace/ [facility] re: GOLD 3  maint on breztri   Chief Complaint  Patient presents with   Follow-up    Prod cough with white sputum, wheezing and SOB with exertion.   Dyspnea:  does own yard self propelled and weed eating baseline doe Cough: am congestion no better with prednisone / mucoid min vol with overt HB Sleeping: flat bed  one pillow  SABA use: 2 x daily  02: none  Covid status:  vax x 2  Rec Plan A = Automatic = Always=    Breztri Take 2 puffs first thing in am and then another 2 puffs about 12 hours later.   Work on inhaler technique:  >>> remember how golfers warm by taking practice swings  Plan B = Backup (to supplement plan A, not to replace it) Only use your albuterol inhaler as a rescue medication Plan C =  Crisis (instead of Plan B but only if Plan B stops working) - only use your albuterol nebulizer if you first try Plan B   Also ok to [facility] to try albuterol 15 min before an activity (on alternating days)  that you know would usually make you short of breath For cough / congestion >  mucinex dm 1200 mg every 12 hours  No mint menthol or chocolate products  Pantoprazole (protonix) 40 mg   Take  30-60 min before first meal of the day and Pepcid (famotidine)  20 mg after supper .    Please schedule a follow up office visit in 6 weeks, call sooner if needed - bring both inhalers     11/17/2021  f/u ov/Zachary Wallace/ [facility] re: aecopd  maint on breztri 2bid  / bid albuterol then much worse x one week, better p last prednisone rx  Chief Complaint  Patient presents with   Follow-up  Dyspnea: gradually worse off pred  to point of  doe x 50 ft assoc with chest tight better p neb / chest soreness ant p coughing fits  Cough: mucus is just whte, more nasal congestion  Sleeping: flat in bed,  one pillow up and down nightly requiring saba which wasn't the case on prednisone   SABA use:  neb x 3 per day since flare  02: none  Covid status: vax x 2 never infected   Rec Depomedrol 120 mg IM  Stop metaprolol and replace with bisoprolol 5 mg  one tablet twice daily-  can use 2 twice daily if Blood pressure too high Prednisone  10  Take 4 for two days three for two days two for two days one for two days  Work on inhaler technique  Ok to try albuterol 15 min before an activity (on alternating days)  that you know would usually make you short of breath  The key is to stop smoking completely before smoking completely stops you! Please remember to go to the  x-ray department :  copd only     12/14/2021  f/u ov/Zachary Wallace/ [facility] re:  GOLD 3  maint on breztri  2bid /  albuterol 2x daily  Chief Complaint  Patient presents with   Follow-up    Down to 4-5 cigarettes/day.  Sore in ches/back/abdomen from  smoking.  Coughing up white mucous since 12/04/21.   Dyspnea:  food lion x several aisles  Cough: much worse x 10 days thick white  Sleeping: flat bed, most nights need albuterol  SABA use: as above, thinks helps ex tol 02: none  Gen chest/ abd discomfort during coughing fits      No obvious day to day or daytime variability or assoc excess/ purulent sputum or mucus plugs or hemoptysis or  chest tightness, subjective wheeze or overt sinus or hb symptoms.    Also denies any obvious fluctuation of symptoms with weather or environmental changes or other aggravating or alleviating factors except as outlined above   No unusual exposure hx or h/o childhood pna/ asthma or knowledge of premature birth.  Current Allergies, Complete Past Medical History, Past Surgical History, Family History, and Social History were reviewed in [employer] record.  ROS  The following are not active complaints unless bolded Hoarseness, sore throat, dysphagia, dental problems, itching, sneezing,  nasal congestion or discharge of excess mucus or purulent secretions, ear ache,   fever, chills, sweats, unintended wt loss or wt gain, classically pleuritic or exertional cp,  orthopnea pnd or arm/hand swelling  or leg swelling, presyncope, palpitations, abdominal pain, anorexia, nausea, vomiting, diarrhea  or change in bowel habits or change in bladder habits, change in stools or change in urine, dysuria, hematuria,  rash, arthralgias, visual complaints, headache, numbness, weakness or ataxia or problems with walking or coordination,  change in mood or  memory.        Current Meds  Medication Sig   albuterol (PROVENTIL) (2.5 MG/3ML) 0.083% nebulizer solution Take 3 mLs (2.5 mg total) by nebulization every 6 (six) hours as needed for wheezing or shortness of breath.   albuterol (VENTOLIN HFA) 108 (90 Base) MCG/ACT inhaler Inhale 1-2 puffs into the lungs every 6 (six) hours as needed for wheezing or  shortness of breath.   amLODipine (NORVASC) 10 MG tablet Take 1 tablet (10 mg total) by mouth daily.   aspirin 81 MG tablet Take 81 mg by mouth.   atorvastatin (LIPITOR) 40 MG tablet Take 1 tablet (40 mg total) by mouth daily.   B-D UF III MINI PEN NEEDLES 31G X 5 MM MISC    bisoprolol (ZEBETA) 5 MG tablet One twice daily   Budeson-Glycopyrrol-Formoterol (BREZTRI AEROSPHERE) 160-9-4.8 MCG/ACT AERO Inhale  2 puffs into the lungs 2 (two) times daily.   Dulaglutide 0.75 MG/0.5ML SOPN Inject 0.5 mg into the skin once a week.   glucose blood (ONETOUCH VERIO) test strip    insulin degludec (TRESIBA) 100 UNIT/ML FlexTouch Pen Inject into the skin.   losartan-hydrochlorothiazide (HYZAAR) 100-12.5 MG tablet Take 1 tablet by mouth daily.   nicotine (NICODERM CQ - DOSED IN MG/24 HOURS) 14 mg/24hr patch Place 1 patch (14 mg total) onto the skin daily.   pantoprazole (PROTONIX) 40 MG tablet Take 1 tablet (40 mg total) by mouth daily. Take 30-60 min before first meal of the day   Respiratory Therapy Supplies (NEBULIZER/TUBING/MOUTHPIECE) KIT Disp one nebulizer machine, tubing set and mouthpiece kit   sildenafil (VIAGRA) 100 MG tablet TAKE (1) TABLET BY MOUTH ONCE DAILY AS NEEDED.                 Past Medical History:  Diagnosis Date   COPD (chronic obstructive pulmonary disease) ([place])    Diabetes mellitus without complication (HCC)    Hyperlipidemia    Hypertension    OSA (obstructive sleep apnea)        Objective:    wts  12/14/2021       176  11/17/2021       177   09/01/21 187 lb (84.8 kg)  08/18/21 185 lb 3.2 oz (84 kg)  06/02/21 182 lb 9.6 oz (82.8 kg)    Vital signs reviewed  12/14/2021  - Note at rest 02 sats  93% on RA   General appearance:    amb bm nad    HEENT :  Oropharynx  clear/ poor lower dentition/ upper plate   Nasal turbinates mild edema   NECK :  without JVD/Nodes/TM/ nl carotid upstrokes bilaterally   LUNGS: no acc muscle use,  Mod barrel  contour chest wall  with bilateral  Distant bs s audible wheeze and  without cough on insp or exp maneuvers and mod  Hyperresonant  to  percussion bilaterally     CV:  RRR  no s3 or murmur or increase in P2, and no edema   ABD:  soft and nontender with pos mid insp Hoover's  in the supine position. No bruits or organomegaly appreciated, bowel sounds nl  MS:   Ext warm without deformities or   obvious joint restrictions , calf tenderness, cyanosis or clubbing  SKIN: warm and dry without lesions    NEURO:  alert, approp, nl sensorium with  no motor or cerebellar deficits apparent.                      Assessment

## 2021-12-25 LAB — HM DIABETES EYE EXAM

## 2021-12-26 ENCOUNTER — Ambulatory Visit: Payer: BC Managed Care – PPO | Admitting: Internal Medicine

## 2021-12-26 ENCOUNTER — Encounter: Payer: Self-pay | Admitting: Internal Medicine

## 2021-12-26 VITALS — BP 130/78 | HR 79 | Temp 97.8°F | Resp 18 | Ht 68.0 in | Wt 176.7 lb

## 2021-12-26 DIAGNOSIS — J449 Chronic obstructive pulmonary disease, unspecified: Secondary | ICD-10-CM

## 2021-12-26 DIAGNOSIS — Z23 Encounter for immunization: Secondary | ICD-10-CM

## 2021-12-26 DIAGNOSIS — F17200 Nicotine dependence, unspecified, uncomplicated: Secondary | ICD-10-CM | POA: Diagnosis not present

## 2021-12-26 DIAGNOSIS — I10 Essential (primary) hypertension: Secondary | ICD-10-CM

## 2021-12-26 DIAGNOSIS — E782 Mixed hyperlipidemia: Secondary | ICD-10-CM

## 2021-12-26 DIAGNOSIS — K219 Gastro-esophageal reflux disease without esophagitis: Secondary | ICD-10-CM

## 2021-12-26 MED ORDER — NICOTINE 7 MG/24HR TD PT24: 7.0000 mg | MEDICATED_PATCH | Freq: Every day | TRANSDERMAL | 0 refills | Status: DC

## 2021-12-26 NOTE — Patient Instructions (Addendum)
It was great seeing you today!  Plan discussed at today's visit: -Nicotine patches refilled today, keep up the excellent work! -Be sure to stay well hydrated and start a men's once daily vitamin to help with cramps  Follow up in: 3 months   Take care and let us know if you have any questions or concerns prior to your next visit.  Dr. Rosana Berger

## 2021-12-26 NOTE — Progress Notes (Signed)
Established Office Visit  Subjective:     Patient ID: Zachary Wallace, male    DOB: May 27, 1955, 66 y.o.   MRN: [id number]  Chief Complaint  Patient presents with   Follow-up   Diabetes   Hyperlipidemia   Hypertension    HPI Patient is in today for follow up.  Diabetes, Type 2: -Last A1c 7.1%, 08/22/21 -Medications: Tresiba 28 units, Trulicity 9.62 mg weekly on Sundays  -Patient is compliant with the above medications and reports no side effects.  -Eye exam: UTD 10/23 -Foot exam: Following with Endocrinologist and Podiatry, foot exam 7/23 -Microalbumin: UTD 5/23 -Statin: yes -PNA vaccine: UTD -Denies symptoms of hypoglycemia, polyuria, polydipsia, numbness extremities, foot ulcers/trauma.  -Follows with Dr. Honor Junes Endocrinology at [facility], note reviewed from 10/23/21  HLD: -Medications: Lipitor 40 mg  -Patient is compliant with above medications and reports no side effects.  -Last lipid panel: 7/23 TC 97, triglycerides 181, HDL 39, LDL 22  Hypertension: -Medications: Amlodipine 10 mg, Losartan-HCTZ 100-12.5, Metoprolol 100 BID switched to Bisoprolol 5 mg BID by Pulmonology last week, patient states he is doing well with this change and has noticed that he is not wheezing as much -Patient is compliant with above medications and reports no side effects. -Checking BP at home (average): No -Denies any SOB, CP, vision changes, LE edema or symptoms of hypotension  COPD: -COPD status: better -Current medications: Breztri daily, albuterol twice daily  -Satisfied with current treatment?: yes -Oxygen use: no -Dyspnea frequency: Improving -Cough frequency: Improving - still daily cough but less sputum production -Rescue inhaler frequency: twice daily -Limitation of activity: yes -Productive cough: Yes, white thick  -Pneumovax: Up to Date -Influenza: Up to Date -Lung cancer screening LUNG-RADS-2 1/23 -Following with Pulmonology, last seen 12/14/21 -Working on quitting smoking,  now down to 4 cigarettes a day, using Nicotine patches.   GERD:  -Had been on Protonix 40 mg but ran out, Pulmonologist gave him Pepcid 20 mg, which he likes better. Symptoms controlled.   Health Maintenance: -Blood work up to date -Colonoscopy 01/2018, repeat in 10 years   Review of Systems  Constitutional:  Negative for chills and fever.  HENT:  Negative for congestion, ear pain, sinus pain and sore throat.   Respiratory:  Positive for cough. Negative for sputum production, shortness of breath and wheezing.   Cardiovascular:  Negative for chest pain.  Gastrointestinal:  Negative for abdominal pain, constipation, diarrhea, heartburn, nausea and vomiting.  Genitourinary:  Negative for dysuria and hematuria.        Objective:    BP 130/78   Pulse 79   Temp 97.8 F (36.6 C)   Resp 18   Ht '5\' 8"'$  (1.727 m)   Wt 176 lb 11.2 oz (80.2 kg)   SpO2 93%   BMI 26.87 kg/m  BP Readings from Last 3 Encounters:  12/26/21 130/78  12/14/21 124/80  11/17/21 120/80   Wt Readings from Last 3 Encounters:  12/26/21 176 lb 11.2 oz (80.2 kg)  12/14/21 176 lb 12.8 oz (80.2 kg)  11/17/21 177 lb 3.2 oz (80.4 kg)      Physical Exam Constitutional:      Appearance: Normal appearance.  HENT:     Head: Normocephalic and atraumatic.  Eyes:     Conjunctiva/sclera: Conjunctivae normal.  Cardiovascular:     Rate and Rhythm: Normal rate and regular rhythm.  Pulmonary:     Effort: Pulmonary effort is normal.     Breath sounds: Normal breath sounds.  Comments: Decreased air movement throughout but  no wheezes, rhonchi Musculoskeletal:     Right lower leg: No edema.     Left lower leg: No edema.  Skin:    General: Skin is warm and dry.  Neurological:     General: No focal deficit present.     Mental Status: He is alert. Mental status is at baseline.  Psychiatric:        Mood and Affect: Mood normal.        Behavior: Behavior normal.     No results found for any visits on  12/26/21.      Assessment & Plan:   1. Tobacco dependence/COPD mixed type [facility]): Doing well with cutting back on cigarettes, down to 4 a day. Will refill Nicotine patches today, lung exam improved. Continue Breztri daily, Albuterol PRN. Doing better with beta blocker switch.  - nicotine (NICODERM CQ - DOSED IN MG/24 HR) 7 mg/24hr patch; Place 1 patch (7 mg total) onto the skin daily.  Dispense: 28 patch; Refill: 0  2. Need for influenza vaccination: Flu vaccine administered today.   - Flu Vaccine QUAD High Dose(Fluad)  3. Essential hypertension: Blood pressure stable, continue Amlodipine 10 mg, Losartan-HCTZ 100-12.5 mg and Bisoprolol 5 mg BID.  4. Mixed hyperlipidemia: Stable, reviewed lipid panel from July. Continue Lipitor 40 mg.   5. Gastroesophageal reflux disease, unspecified whether esophagitis present: Stable on Pepcid 20 mg daily.    Return in about 3 months (around 03/28/2022).  Teodora Medici, DO

## 2022-01-11 ENCOUNTER — Ambulatory Visit: Payer: BC Managed Care – PPO | Admitting: Internal Medicine

## 2022-01-11 ENCOUNTER — Encounter: Payer: Self-pay | Admitting: Internal Medicine

## 2022-01-11 DIAGNOSIS — F17210 Nicotine dependence, cigarettes, uncomplicated: Secondary | ICD-10-CM | POA: Diagnosis not present

## 2022-01-11 DIAGNOSIS — J449 Chronic obstructive pulmonary disease, unspecified: Secondary | ICD-10-CM | POA: Diagnosis not present

## 2022-01-11 MED ORDER — PREDNISONE 10 MG PO TABS: ORAL_TABLET | ORAL | 0 refills | Status: DC

## 2022-01-11 MED ORDER — PANTOPRAZOLE SODIUM 40 MG PO TBEC: 40.0000 mg | DELAYED_RELEASE_TABLET | Freq: Every day | ORAL | 2 refills | Status: DC

## 2022-01-11 NOTE — Assessment & Plan Note (Signed)
Counseled re importance of smoking cessation but did not meet time criteria for separate billing   Already in lung cancer screening program         Each maintenance medication was reviewed in detail including emphasizing most importantly the difference between maintenance and prns and under what circumstances the prns are to be triggered using an action plan format where appropriate.  Total time for H and P, chart review, counseling, reviewing hfa device(s) and generating customized AVS unique to this office visit / same day charting = 23 min

## 2022-01-11 NOTE — Progress Notes (Signed)
Zachary Wallace, male    DOB: Jun 15, 1955  MRN: [id number]   Brief patient profile:  78  yobm active smoker reported dx of asthma in his 63s referred to pulmonary clinic in [facility]  06/02/2021 by  Delsa Grana PA at [facility] for copd eval p exac 04/07/21 and placed on breztri which has helped despite poor hfa technique    History of Present Illness  06/02/2021  Pulmonary/ 1st office eval/ Zachary Wallace / [place] / maint on [employer] Complaint  Patient presents with   Consult  Dyspnea:  can do front yard behind a self propelled mower s stopping x 30  Cough: after lies down but does fine flat and no am flare > white mucus  Sleep: one pillow  Rec Plan A = Automatic = Always=    Breztri Take 2 puffs first thing in am and then another 2 puffs about 12 hours later.  Work on inhaler technique:   Plan B = Backup (to supplement plan A, not to replace it) Only use your albuterol inhaler as a rescue medication Plan C = Crisis (instead of Plan B but only if Plan B stops working) - only use your albuterol nebulizer if you first try Plan B  Please schedule a follow up visit in 3 months but call sooner if needed with pfts on return     09/01/2021  f/u ov/Zachary Wallace/ [facility] re: GOLD 3  maint on breztri   Chief Complaint  Patient presents with   Follow-up    Prod cough with white sputum, wheezing and SOB with exertion.   Dyspnea:  does own yard self propelled and weed eating baseline doe Cough: am congestion no better with prednisone / mucoid min vol with overt HB Sleeping: flat bed  one pillow  SABA use: 2 x daily  02: none  Covid status:  vax x 2  Rec Plan A = Automatic = Always=    Breztri Take 2 puffs first thing in am and then another 2 puffs about 12 hours later.   Work on inhaler technique:  >>> remember how golfers warm by taking practice swings  Plan B = Backup (to supplement plan A, not to replace it) Only use your albuterol inhaler as a rescue medication Plan C =  Crisis (instead of Plan B but only if Plan B stops working) - only use your albuterol nebulizer if you first try Plan B   Also ok to [facility] to try albuterol 15 min before an activity (on alternating days)  that you know would usually make you short of breath For cough / congestion >  mucinex dm 1200 mg every 12 hours  No mint menthol or chocolate products  Pantoprazole (protonix) 40 mg   Take  30-60 min before first meal of the day and Pepcid (famotidine)  20 mg after supper .    Please schedule a follow up office visit in 6 weeks, call sooner if needed - bring both inhalers     11/17/2021  f/u ov/Zachary Wallace/ [facility] re: aecopd  maint on breztri 2bid  / bid albuterol then much worse x one week, better p last prednisone rx  Chief Complaint  Patient presents with   Follow-up  Dyspnea: gradually worse off pred  to point of  doe x 50 ft assoc with chest tight better p neb / chest soreness ant p coughing fits  Cough: mucus is just whte, more nasal congestion  Sleeping: flat in bed,  one pillow up and down nightly requiring saba which wasn't the case on prednisone   SABA use:  neb x 3 per day since flare  02: none  Covid status: vax x 2 never infected   Rec Depomedrol 120 mg IM  Stop metaprolol and replace with bisoprolol 5 mg  one tablet twice daily-  can use 2 twice daily if Blood pressure too high Prednisone  10  Take 4 for two days three for two days two for two days one for two days  Work on inhaler technique  Ok to try albuterol 15 min before an activity (on alternating days)  that you know would usually make you short of breath  The key is to stop smoking completely before smoking completely stops you! Please remember to go to the  x-ray department :  copd only     12/14/2021  f/u ov/Zachary Wallace/ [facility] re:  GOLD 3  maint on breztri  2bid /  albuterol 2x daily  Chief Complaint  Patient presents with   Follow-up    Down to 4-5 cigarettes/day.  Sore in ches/back/abdomen from  smoking.  Coughing up white mucous since 12/04/21.   Dyspnea:  food lion x several aisles  Cough: much worse x 10 days thick white  Sleeping: flat bed, most nights need albuterol  SABA use: as above, thinks helps ex tol 02: none  Gen chest/ abd discomfort during coughing fits  Rec Depomedrol 120 mg IM  Take mucinex dm 1200 mg  every 12 hours and supplement if needed with  Tylenol #3   up to 1-2 every 4 hours to suppress the urge to cough. Once you have eliminated the cough for 3 straight days try reducing the Tylenol #3 first,  then the Mucinex dm as tolerated.    Be sure taking Pantoprazole (protonix) 40 mg   Take  30-60 min before first meal of the day and add Pepcid (famotidine)  20 mg after supper until return to office -  Work on inhaler technique:     01/11/2022  f/u ov/Zachary Wallace/ [facility] re: GOLD 3    maint on breztri no ppi    Chief Complaint  Patient presents with   Follow-up   Dyspnea:  food lion ok, a bit tight this am Cough: min mucoid  Sleeping: flat bed ok now s resp cc  SABA use: not using neb now / hfa still suboptimal technique 02: none  Covid status:   vax x 2    No obvious day to day or daytime variability or assoc excess/ purulent sputum or mucus plugs or hemoptysis or cp or  subjective wheeze or overt sinus or hb symptoms.   Sleeping  without nocturnal  or early am exacerbation  of respiratory  c/o's or need for noct saba. Also denies any obvious fluctuation of symptoms with weather or environmental changes or other aggravating or alleviating factors except as outlined above   No unusual exposure hx or h/o childhood pna/ asthma or knowledge of premature birth.  Current Allergies, Complete Past Medical History, Past Surgical History, Family History, and Social History were reviewed in [employer] record.  ROS  The following are not active complaints unless bolded Hoarseness, sore throat, dysphagia, dental problems, itching, sneezing,   nasal congestion or discharge of excess mucus or purulent secretions, ear ache,   fever, chills, sweats, unintended wt loss or wt gain, classically pleuritic or exertional cp,  orthopnea pnd or arm/hand swelling  or leg  swelling, presyncope, palpitations, abdominal pain, anorexia, nausea, vomiting, diarrhea  or change in bowel habits or change in bladder habits, change in stools or change in urine, dysuria, hematuria,  rash, arthralgias, visual complaints, headache, numbness, weakness or ataxia or problems with walking or coordination,  change in mood or  memory.        Current Meds  Medication Sig   albuterol (PROVENTIL) (2.5 MG/3ML) 0.083% nebulizer solution Take 3 mLs (2.5 mg total) by nebulization every 6 (six) hours as needed for wheezing or shortness of breath.   albuterol (VENTOLIN HFA) 108 (90 Base) MCG/ACT inhaler Inhale 1-2 puffs into the lungs every 6 (six) hours as needed for wheezing or shortness of breath.   amLODipine (NORVASC) 10 MG tablet Take 1 tablet (10 mg total) by mouth daily.   aspirin 81 MG tablet Take 81 mg by mouth.   atorvastatin (LIPITOR) 40 MG tablet Take 1 tablet (40 mg total) by mouth daily.   B-D UF III MINI PEN NEEDLES 31G X 5 MM MISC    bisoprolol (ZEBETA) 5 MG tablet One twice daily   Budeson-Glycopyrrol-Formoterol (BREZTRI AEROSPHERE) 160-9-4.8 MCG/ACT AERO Inhale 2 puffs into the lungs 2 (two) times daily.   Dulaglutide 0.75 MG/0.5ML SOPN Inject 0.5 mg into the skin once a week.   famotidine (PEPCID) 20 MG tablet One after supper   glucose blood (ONETOUCH VERIO) test strip    insulin degludec (TRESIBA) 100 UNIT/ML FlexTouch Pen Inject into the skin.   losartan-hydrochlorothiazide (HYZAAR) 100-12.5 MG tablet Take 1 tablet by mouth daily.   nicotine (NICODERM CQ - DOSED IN MG/24 HR) 7 mg/24hr patch Place 1 patch (7 mg total) onto the skin daily.   Respiratory Therapy Supplies (NEBULIZER/TUBING/MOUTHPIECE) KIT Disp one nebulizer machine, tubing set and mouthpiece  kit   sildenafil (VIAGRA) 100 MG tablet TAKE (1) TABLET BY MOUTH ONCE DAILY AS NEEDED.                           Past Medical History:  Diagnosis Date   COPD (chronic obstructive pulmonary disease) ([place])    Diabetes mellitus without complication (HCC)    Hyperlipidemia    Hypertension    OSA (obstructive sleep apnea)        Objective:    wts  01/11/2022      177  12/14/2021       176  11/17/2021       177   09/01/21 187 lb (84.8 kg)  08/18/21 185 lb 3.2 oz (84 kg)  06/02/21 182 lb 9.6 oz (82.8 kg)    Vital signs reviewed  01/11/2022  - Note at rest 02 sats  94% on RA   General appearance:    amb wm slt hoarse  HEENT :  Oropharynx  clear/ edentulous/ mild pseudowheeze    NECK :  without JVD/Nodes/TM/ nl carotid upstrokes bilaterally   LUNGS: no acc muscle use,  Mod barrel  contour chest wall with bilateral  Distant bs s audible wheeze and  without cough on insp or exp maneuvers and mod  Hyperresonant  to  percussion bilaterally     CV:  RRR  no s3 or murmur or increase in P2, and no edema   ABD:  soft and nontender with pos mid insp Hoover's  in the supine position. No bruits or organomegaly appreciated, bowel sounds nl  MS:   Ext warm without deformities or   obvious joint restrictions , calf tenderness, cyanosis  or clubbing  SKIN: warm and dry without lesions    NEURO:  alert, approp, nl sensorium with  no motor or cerebellar deficits apparent.                        Assessment

## 2022-01-11 NOTE — Patient Instructions (Signed)
Plan A = Automatic = Always=    breztri Take 2 puffs first thing in am and then another 2 puffs about 12 hours later.    Work on inhaler technique:  relax and gently blow all the way out then take a nice smooth full deep breath back in, triggering the inhaler at same time you start breathing in.  Hold breath in for at least  5 seconds if you can. Blow out breztri  thru nose. Rinse and gargle with water when done.  If mouth or throat bother you at all,  try brushing teeth/gums/tongue with arm and hammer toothpaste/ make a slurry and gargle and spit out.     Plan B = Backup (to supplement plan A, not to replace it) Only use your albuterol inhaler as a rescue medication to be used if you can't catch your breath by resting or doing a relaxed purse lip breathing pattern.  - The less you use it, the better it will work when you need it. - Ok to use the inhaler up to 2 puffs  every 4 hours if you must but call for appointment if use goes up over your usual need - Don't leave home without it !!  (think of it like the spare tire for your car)   Plan C = Crisis (instead of Plan B but only if Plan B stops working) - only use your albuterol nebulizer if you first try Plan B and it fails to help > ok to use the nebulizer up to every 4 hours but if start needing it regularly call for immediate appointment    Please schedule a follow up office visit in 6 weeks, call sooner if needed

## 2022-01-11 NOTE — Assessment & Plan Note (Signed)
Active smoker - emphysema noted on chest CT Dec 2018 - PFT's  08/13/17  FEV1 0.66  (22 % ) ratio 0.26  p 13 % improvement from saba p ? nothing prior to study with DLCO  10.3 (44%)  and FV curve classic concavity   - PFT's  08/17/21   FEV1 0.95 (32 % ) ratio 0.41  p 6 % improvement from saba p breztri prior to study with DLCO  11.9 (42%)  FV curve classically convcave - 09/01/2021   continue breztri plus approp saba and short term gerd rx due to upper airway wheeze on exam    - 01/11/2022  After extensive coaching inhaler device,  effectiveness =    75% > breztri and gerd rx    Group D (now reclassified as E) in terms of symptom/risk and laba/lama/ICS  therefore appropriate rx at this point >>>  Continue breztri plus add ppi ac for pseudowheeze and Prednisone 10 mg take  4 each am x 2 days,   2 each am x 2 days,  1 each am x 2 days and stop

## 2022-03-13 ENCOUNTER — Encounter: Payer: Self-pay | Admitting: Adult Health

## 2022-03-13 ENCOUNTER — Ambulatory Visit: Payer: BC Managed Care – PPO | Admitting: Adult Health

## 2022-03-13 VITALS — BP 100/60 | HR 76 | Temp 97.8°F | Ht 68.0 in | Wt 183.6 lb

## 2022-03-13 DIAGNOSIS — J449 Chronic obstructive pulmonary disease, unspecified: Secondary | ICD-10-CM | POA: Diagnosis not present

## 2022-03-13 DIAGNOSIS — R0600 Dyspnea, unspecified: Secondary | ICD-10-CM | POA: Insufficient documentation

## 2022-03-13 DIAGNOSIS — R0789 Other chest pain: Secondary | ICD-10-CM

## 2022-03-13 DIAGNOSIS — R0609 Other forms of dyspnea: Secondary | ICD-10-CM

## 2022-03-13 MED ORDER — PREDNISONE 20 MG PO TABS: 20.0000 mg | ORAL_TABLET | Freq: Every day | ORAL | 0 refills | Status: DC

## 2022-03-13 NOTE — Progress Notes (Signed)
_0  ID: Zachary Wallace, male    DOB: 1955/07/27, 66 y.o.   MRN: [id number]  Chief Complaint  Patient presents with   Follow-up    Referring provider: Teodora Medici, DO  HPI: 66 year old male active smoker followed for COPD with emphysema .  Medical history significant for diabetes Participates in the lung cancer CT chest screening program  TEST/EVENTS :  emphysema noted on chest CT Dec 2018 - PFT's  08/13/17  FEV1 0.66  (22 % ) ratio 0.26  p 13 % improvement from saba p ? nothing prior to study with DLCO  10.3 (44%)  and FV curve classic concavity   - PFT's  08/17/21   FEV1 0.95 (32 % ) ratio 0.41  p 6 % improvement from saba p breztri prior to study with DLCO  11.9 (42%)  FV curve classically convcave  03/13/2022 Follow up : COPD  Patient presents for a 11-monthfollow-up.  Complains over last week with increased cough that is minimally productive and wheezing . Some tightness. Still smoking but has cut back down quite a bit. Trying to quit.  Smoking cessation discussed. Increased albuterol use over last week.  Last steroids were in September 2023.  Remains on BREZTRI inhaler Twice daily  . Endorses compliance.  Got flare up of cough after exposed to burning leaves.  Retired. Gets winded with heavy activities .  Fever, discolored mucus, hemoptysis, chest pain.  Strong family history of CAD. Wants referral to Cardiology .   No Known Allergies  Immunization History  Administered Date(s) Administered   Fluad Quad(high Dose 65+) 12/26/2021   Influenza,inj,Quad PF,6+ Mos 12/02/2014, 12/02/2017   Influenza,inj,quad, With Preservative 01/05/2019   Influenza-Unspecified 12/30/2019, 11/24/2020   Moderna Sars-Covid-2 Vaccination 03/08/2020   PNEUMOCOCCAL CONJUGATE-20 09/01/2020   Pneumococcal Conjugate-13 06/30/2013, 12/02/2014   Pneumococcal Polysaccharide-23 02/27/2008   Tdap 02/03/2010, 09/25/2021   Zoster Recombinat (Shingrix) 12/02/2017    Past Medical History:   Diagnosis Date   COPD (chronic obstructive pulmonary disease) (HL[place]    Diabetes mellitus without complication (HA[place]il[place]    Hyperlipidemia    Hypertension    OSA (obstructive sleep apnea)     Tobacco History: Social History   Tobacco Use  Smoking Status Every Day   Packs/day: 1.00   Years: 41.00   Total pack years: 41.00   Types: Cigarettes  Smokeless Tobacco Never  Tobacco Comments   Smoked last on 12/11.  Trying to quit.   Using patches.  HFB  03/13/2022   Ready to quit: Not Answered Counseling given: Not Answered Tobacco comments: Smoked last on 12/11.  Trying to quit.   Using patches.  HFB  03/13/2022   Outpatient Medications Prior to Visit  Medication Sig Dispense Refill   albuterol (PROVENTIL) (2.5 MG/3ML) 0.083% nebulizer solution Take 3 mLs (2.5 mg total) by nebulization every 6 (six) hours as needed for wheezing or shortness of breath. 150 mL 1   albuterol (VENTOLIN HFA) 108 (90 Base) MCG/ACT inhaler Inhale 1-2 puffs into the lungs every 6 (six) hours as needed for wheezing or shortness of breath. 18 g 5   amLODipine (NORVASC) 10 MG tablet Take 1 tablet (10 mg total) by mouth daily. 90 tablet 3   aspirin 81 MG tablet Take 81 mg by mouth.     atorvastatin (LIPITOR) 40 MG tablet Take 1 tablet (40 mg total) by mouth daily. 90 tablet 3   B-D UF III MINI PEN NEEDLES 31G X 5 MM MISC  bisoprolol (ZEBETA) 5 MG tablet One twice daily 60 tablet 11   Budeson-Glycopyrrol-Formoterol (BREZTRI AEROSPHERE) 160-9-4.8 MCG/ACT AERO Inhale 2 puffs into the lungs 2 (two) times daily. 10.7 g 11   Dulaglutide 0.75 MG/0.5ML SOPN Inject 0.5 mg into the skin once a week.     famotidine (PEPCID) 20 MG tablet One after supper 30 tablet 11   glucose blood (ONETOUCH VERIO) test strip      insulin degludec (TRESIBA) 100 UNIT/ML FlexTouch Pen Inject into the skin.     losartan-hydrochlorothiazide (HYZAAR) 100-12.5 MG tablet Take 1 tablet by mouth daily. 90 tablet 1   nicotine (NICODERM CQ - DOSED  IN MG/24 HR) 7 mg/24hr patch Place 1 patch (7 mg total) onto the skin daily. 28 patch 0   pantoprazole (PROTONIX) 40 MG tablet Take 1 tablet (40 mg total) by mouth daily. Take 30-60 min before first meal of the day 30 tablet 2   Respiratory Therapy Supplies (NEBULIZER/TUBING/MOUTHPIECE) KIT Disp one nebulizer machine, tubing set and mouthpiece kit 1 kit 0   sildenafil (VIAGRA) 100 MG tablet TAKE (1) TABLET BY MOUTH ONCE DAILY AS NEEDED. 30 tablet 5   predniSONE (DELTASONE) 10 MG tablet Take  4 each am x 2 days,   2 each am x 2 days,  1 each am x 2 days and stop (Patient not taking: Reported on 03/13/2022) 14 tablet 0   No facility-administered medications prior to visit.     Review of Systems:   Constitutional:   No  weight loss, night sweats,  Fevers, chills,  +fatigue, or  lassitude.  HEENT:   No headaches,  Difficulty swallowing,  Tooth/dental problems, or  Sore throat,                No sneezing, itching, ear ache, +nasal congestion, post nasal drip,   CV:  No chest pain,  Orthopnea, PND, swelling in lower extremities, anasarca, dizziness, palpitations, syncope.   GI  No heartburn, indigestion, abdominal pain, nausea, vomiting, diarrhea, change in bowel habits, loss of appetite, bloody stools.   Resp:   No chest wall deformity  Skin: no rash or lesions.  GU: no dysuria, change in color of urine, no urgency or frequency.  No flank pain, no hematuria   MS:  No joint pain or swelling.  No decreased range of motion.  No back pain.    Physical Exam  BP 100/60 (BP Location: Left Arm, Patient Position: Sitting, Cuff Size: Normal)   Pulse 76   Temp 97.8 F (36.6 C) (Oral)   Ht _0  (1.727 m)   Wt 183 lb 9.6 oz (83.3 kg)   SpO2 91%   BMI 27.92 kg/m   GEN: A/Ox3; pleasant , NAD, well nourished    HEENT:  [place]/AT,   NOSE-clear, THROAT-clear, no lesions, no postnasal drip or exudate noted.   NECK:  Supple w/ fair ROM; no JVD; normal carotid impulses w/o bruits; no thyromegaly  or nodules palpated; no lymphadenopathy.    RESP  Clear  P & A; w/o, wheezes/ rales/ or rhonchi. no accessory muscle use, no dullness to percussion  CARD:  RRR, no m/r/g, no peripheral edema, pulses intact, no cyanosis or clubbing.  GI:   Soft & nt; nml bowel sounds; no organomegaly or masses detected.   Musco: Warm bil, no deformities or joint swelling noted.   Neuro: alert, no focal deficits noted.    Skin: Warm, no lesions or rashes    Lab Results:  CBC  No  results found for: "BNP"  ProBNP No results found for: "PROBNP"  Imaging: No results found.       Latest Ref Rng & Units 08/17/2021    9:55 AM  PFT Results  FVC-Pre L 2.35   FVC-Predicted Pre % 61   FVC-Post L 2.33   FVC-Predicted Post % 61   Pre FEV1/FVC % % 38   Post FEV1/FCV % % 41   FEV1-Pre L 0.88   FEV1-Predicted Pre % 30   FEV1-Post L 0.95   DLCO uncorrected ml/min/mmHg 11.19   DLCO UNC% % 42   DLVA Predicted % 50   TLC L 5.75   TLC % Predicted % 84   RV % Predicted % 113     No results found for: "NITRICOXIDE"      Assessment & Plan:   COPD GOLD 3   / still smoking  Mild COPD flare.-Encouraged on smoking cessation.  Patient is on aggressive maintenance regimen with triple therapy inhaler. Will give short course of steroids.  Patient has underlying diabetes have advised on potential complications of recurrent steroid use. If continues to flare consider adding Daliresp  Plan  Patient Instructions  Prednisone 77m daily for 5 days , take with food.  Continue on Breztri inhaler 2 puffs Twice daily  , rinse after use Mucinex DM Twice daily  As needed  cough/congestion  Albuterol inhaler or neb As needed   Work on not smoking  CT chest as planned next month .   Refer to Cardiology.   Follow up with Dr. WMelvyn Novas in 2 months and As needed   Please contact office for sooner follow up if symptoms do not improve or worsen or seek emergency care       Dyspnea Strong family history of  CAD, requests referral to Cardiology   Plan  Patient Instructions  Prednisone 250mdaily for 5 days , take with food.  Continue on Breztri inhaler 2 puffs Twice daily  , rinse after use Mucinex DM Twice daily  As needed  cough/congestion  Albuterol inhaler or neb As needed   Work on not smoking  CT chest as planned next month .   Refer to Cardiology.   Follow up with Dr. WeMelvyn Novasin 2 months and As needed   Please contact office for sooner follow up if symptoms do not improve or worsen or seek emergency care        TaRexene EdisonNP 03/13/2022

## 2022-03-13 NOTE — Assessment & Plan Note (Signed)
Strong family history of CAD, requests referral to Cardiology   Plan  Patient Instructions  Prednisone '20mg'$  daily for 5 days , take with food.  Continue on Breztri inhaler 2 puffs Twice daily  , rinse after use Mucinex DM Twice daily  As needed  cough/congestion  Albuterol inhaler or neb As needed   Work on not smoking  CT chest as planned next month .   Refer to Cardiology.   Follow up with Dr. Melvyn Novas  in 2 months and As needed   Please contact office for sooner follow up if symptoms do not improve or worsen or seek emergency care

## 2022-03-13 NOTE — Assessment & Plan Note (Signed)
Mild COPD flare.-Encouraged on smoking cessation.  Patient is on aggressive maintenance regimen with triple therapy inhaler. Will give short course of steroids.  Patient has underlying diabetes have advised on potential complications of recurrent steroid use. If continues to flare consider adding Roslyn  Patient Instructions  Prednisone '20mg'$  daily for 5 days , take with food.  Continue on Breztri inhaler 2 puffs Twice daily  , rinse after use Mucinex DM Twice daily  As needed  cough/congestion  Albuterol inhaler or neb As needed   Work on not smoking  CT chest as planned next month .   Refer to Cardiology.   Follow up with Dr. Melvyn Novas  in 2 months and As needed   Please contact office for sooner follow up if symptoms do not improve or worsen or seek emergency care

## 2022-03-13 NOTE — Patient Instructions (Addendum)
Prednisone '20mg'$  daily for 5 days , take with food.  Continue on Breztri inhaler 2 puffs Twice daily  , rinse after use Mucinex DM Twice daily  As needed  cough/congestion  Albuterol inhaler or neb As needed   Work on not smoking  CT chest as planned next month .   Refer to Cardiology.   Follow up with Dr. Melvyn Novas  in 2 months and As needed   Please contact office for sooner follow up if symptoms do not improve or worsen or seek emergency care

## 2022-03-25 ENCOUNTER — Other Ambulatory Visit: Payer: Self-pay | Admitting: Nurse Practitioner

## 2022-03-25 DIAGNOSIS — J441 Chronic obstructive pulmonary disease with (acute) exacerbation: Secondary | ICD-10-CM

## 2022-03-27 NOTE — Telephone Encounter (Signed)
Requested medication (s) are due for refill today: yes  Requested medication (s) are on the active medication list: yes  Last refill:  04/20/21  Future visit scheduled: yes  Notes to clinic:  Medication not assigned to a protocol, review manually.       Requested Prescriptions  Pending Prescriptions Disp Refills   BREZTRI AEROSPHERE 160-9-4.8 MCG/ACT AERO [Pharmacy Med Name: Judithann Sauger Aerosphere 160 mcg-30mg-4.8mcg/actuation HFA aerosol inhaler] 10.7 g 11    Sig: INHALE 2 PUFFS BY MOUTH TWICE DAILY     Off-Protocol Failed - 03/25/2022  8:04 AM      Failed - Medication not assigned to a protocol, review manually.      Passed - Valid encounter within last 12 months    Recent Outpatient Visits           3 months ago [facility]   6 months ago COPD GOLD 3   / still smoking    [place]   7 months ago COPD with acute exacerbation ([facility]   CJeff DO   11 months ago COPD with acute exacerbation ([facility]   [place] FNP   11 months ago COPD with acute exacerbation ([facility]   [facility] Sizer [facility]       Future Appointments             Tomorrow [facility]  In 3 weeks AKate Sable MD [place]  In 1 month Wert, MChristena Deem MD [place]

## 2022-03-27 NOTE — Progress Notes (Unsigned)
Established Office Visit  Subjective:     Patient ID: Zachary Wallace, male    DOB: [birth year], 67 y.o.   MRN: [id number]  No chief complaint on file.   HPI Patient is in today for follow up.  Diabetes, Type 2: -Last A1c 7.1%, 08/22/21 -Medications: Tresiba 28 units, Trulicity 6.26 mg weekly on Sundays  -Patient is compliant with the above medications and reports no side effects.  -Eye exam: UTD 10/23 -Foot exam: Following with Endocrinologist and Podiatry, foot exam 7/23 -Microalbumin: UTD 5/23 -Statin: yes -PNA vaccine: UTD -Denies symptoms of hypoglycemia, polyuria, polydipsia, numbness extremities, foot ulcers/trauma.  -Follows with Dr. Honor Junes Endocrinology at [facility], note reviewed from 10/23/21  HLD: -Medications: Lipitor 40 mg  -Patient is compliant with above medications and reports no side effects.  -Last lipid panel: 7/23 TC 97, triglycerides 181, HDL 39, LDL 22  Hypertension: -Medications: Amlodipine 10 mg, Losartan-HCTZ 100-12.5, Bisoprolol 5 mg BID  -Patient is compliant with above medications and reports no side effects. -Checking BP at home (average): No -Denies any SOB, CP, vision changes, LE edema or symptoms of hypotension  COPD: -COPD status: better -Current medications: Breztri BID, albuterol twice daily  -Satisfied with current treatment?: yes -Oxygen use: no -Dyspnea frequency: Improving -Cough frequency: Improving - still daily cough but less sputum production -Rescue inhaler frequency: twice daily -Limitation of activity: yes -Productive cough: Yes, white thick  -Pneumovax: Up to Date -Influenza: Up to Date -Lung cancer screening LUNG-RADS-2 1/23 -Following with Pulmonology, last seen 03/13/22 -Working on quitting smoking, now down to 4 cigarettes a day, using Nicotine patches.   GERD:  -Had been on Protonix 40 mg but ran out, Pulmonologist gave him Pepcid 20 mg, which he likes better. Symptoms controlled.   Health Maintenance: -Blood work  up to date -Colonoscopy 01/2018, repeat in 10 years   Review of Systems  Constitutional:  Negative for chills and fever.  HENT:  Negative for congestion, ear pain, sinus pain and sore throat.   Respiratory:  Positive for cough. Negative for sputum production, shortness of breath and wheezing.   Cardiovascular:  Negative for chest pain.  Gastrointestinal:  Negative for abdominal pain, constipation, diarrhea, heartburn, nausea and vomiting.  Genitourinary:  Negative for dysuria and hematuria.        Objective:    There were no vitals taken for this visit. BP Readings from Last 3 Encounters:  03/13/22 100/60  01/11/22 102/70  12/26/21 130/78   Wt Readings from Last 3 Encounters:  03/13/22 183 lb 9.6 oz (83.3 kg)  01/11/22 177 lb 3.2 oz (80.4 kg)  12/26/21 176 lb 11.2 oz (80.2 kg)      Physical Exam Constitutional:      Appearance: Normal appearance.  HENT:     Head: Normocephalic and atraumatic.  Eyes:     Conjunctiva/sclera: Conjunctivae normal.  Cardiovascular:     Rate and Rhythm: Normal rate and regular rhythm.  Pulmonary:     Effort: Pulmonary effort is normal.     Breath sounds: Normal breath sounds.     Comments: Decreased air movement throughout but  no wheezes, rhonchi Musculoskeletal:     Right lower leg: No edema.     Left lower leg: No edema.  Skin:    General: Skin is warm and dry.  Neurological:     General: No focal deficit present.     Mental Status: He is alert. Mental status is at baseline.  Psychiatric:  Mood and Affect: Mood normal.        Behavior: Behavior normal.     No results found for any visits on 03/28/22.      Assessment & Plan:   1. Tobacco dependence/COPD mixed type [facility]): Doing well with cutting back on cigarettes, down to 4 a day. Will refill Nicotine patches today, lung exam improved. Continue Breztri daily, Albuterol PRN. Doing better with beta blocker switch.  - nicotine (NICODERM CQ - DOSED IN MG/24 HR) 7 mg/24hr  patch; Place 1 patch (7 mg total) onto the skin daily.  Dispense: 28 patch; Refill: 0  2. Need for influenza vaccination: Flu vaccine administered today.   - Flu Vaccine QUAD High Dose(Fluad)  3. Essential hypertension: Blood pressure stable, continue Amlodipine 10 mg, Losartan-HCTZ 100-12.5 mg and Bisoprolol 5 mg BID.  4. Mixed hyperlipidemia: Stable, reviewed lipid panel from July. Continue Lipitor 40 mg.   5. Gastroesophageal reflux disease, unspecified whether esophagitis present: Stable on Pepcid 20 mg daily.    No follow-ups on file.  Teodora Medici, DO

## 2022-03-28 ENCOUNTER — Encounter: Payer: Self-pay | Admitting: Internal Medicine

## 2022-03-28 ENCOUNTER — Ambulatory Visit (INDEPENDENT_AMBULATORY_CARE_PROVIDER_SITE_OTHER): Payer: BC Managed Care – PPO | Admitting: Internal Medicine

## 2022-03-28 VITALS — BP 116/62 | HR 87 | Temp 98.1°F | Resp 16 | Ht 68.0 in | Wt 180.3 lb

## 2022-03-28 DIAGNOSIS — E782 Mixed hyperlipidemia: Secondary | ICD-10-CM

## 2022-03-28 DIAGNOSIS — I10 Essential (primary) hypertension: Secondary | ICD-10-CM | POA: Diagnosis not present

## 2022-03-28 DIAGNOSIS — J449 Chronic obstructive pulmonary disease, unspecified: Secondary | ICD-10-CM | POA: Diagnosis not present

## 2022-03-28 DIAGNOSIS — K219 Gastro-esophageal reflux disease without esophagitis: Secondary | ICD-10-CM | POA: Diagnosis not present

## 2022-03-28 DIAGNOSIS — N529 Male erectile dysfunction, unspecified: Secondary | ICD-10-CM

## 2022-03-28 MED ORDER — AMLODIPINE BESYLATE 10 MG PO TABS: 10.0000 mg | ORAL_TABLET | Freq: Every day | ORAL | 3 refills | Status: DC

## 2022-03-28 MED ORDER — LOSARTAN POTASSIUM-HCTZ 100-12.5 MG PO TABS: 1.0000 | ORAL_TABLET | Freq: Every day | ORAL | 1 refills | Status: DC

## 2022-03-28 MED ORDER — PANTOPRAZOLE SODIUM 40 MG PO TBEC: 40.0000 mg | DELAYED_RELEASE_TABLET | Freq: Every day | ORAL | 2 refills | Status: DC

## 2022-03-28 MED ORDER — SILDENAFIL CITRATE 100 MG PO TABS: 100.0000 mg | ORAL_TABLET | ORAL | 5 refills | Status: DC | PRN

## 2022-03-28 MED ORDER — ATORVASTATIN CALCIUM 40 MG PO TABS: 40.0000 mg | ORAL_TABLET | Freq: Every day | ORAL | 3 refills | Status: DC

## 2022-03-28 NOTE — Patient Instructions (Signed)
It was great seeing you today!  Plan discussed at today's visit: -Medications refilled today -Recommend RSV vaccine -Obtain a pulse oximeter, monitor oxygen levels with activity, if <80% consistently please let me or pulmonologist know  Follow up in: 3 months   Take care and let us know if you have any questions or concerns prior to your next visit.  Dr. Rosana Berger

## 2022-03-29 ENCOUNTER — Other Ambulatory Visit: Payer: Self-pay | Admitting: Internal Medicine

## 2022-03-29 DIAGNOSIS — E782 Mixed hyperlipidemia: Secondary | ICD-10-CM

## 2022-03-29 DIAGNOSIS — I10 Essential (primary) hypertension: Secondary | ICD-10-CM

## 2022-03-29 NOTE — Telephone Encounter (Signed)
Last RF for both requested meds: 03/28/22  Requested Prescriptions  Refused Prescriptions Disp Refills   losartan-hydrochlorothiazide (HYZAAR) 100-12.5 MG tablet [Pharmacy Med Name: losartan 100 mg-hydrochlorothiazide 12.5 mg tablet] 90 tablet 1    Sig: TAKE ONE TABLET BY MOUTH ONCE DAILY     Cardiovascular: ARB + Diuretic Combos Failed - 03/29/2022  8:00 AM      Failed - K in normal range and within 180 days    Potassium  Date Value Ref Range Status  02/23/2021 4.4 3.5 - 5.3 mmol/L Final         Failed - Na in normal range and within 180 days    Sodium  Date Value Ref Range Status  02/23/2021 138 135 - 146 mmol/L Final         Failed - Cr in normal range and within 180 days    Creat  Date Value Ref Range Status  02/23/2021 1.68 (H) 0.70 - 1.35 mg/dL Final   Creatinine, Urine  Date Value Ref Range Status  08/14/2017 81 20 - 320 mg/dL Final         Failed - eGFR is 10 or above and within 180 days    GFR, Est African American  Date Value Ref Range Status  02/29/2020 40 (L) > OR = 60 mL/min/1.62m Final   GFR, Est Non African American  Date Value Ref Range Status  02/29/2020 34 (L) > OR = 60 mL/min/1.725mFinal   eGFR  Date Value Ref Range Status  02/23/2021 45 (L) > OR = 60 mL/min/1.7311minal    Comment:    The eGFR is based on the CKD-EPI 2021 equation. To calculate  the new eGFR from a previous Creatinine or Cystatin C result, go to https://www.kidney.org/professionals/ kdoqi/gfr%5Fcalculator          Passed - Patient is not pregnant      Passed - Last BP in normal range    BP Readings from Last 1 Encounters:  03/28/22 116/62         Passed - Valid encounter within last 6 months    Recent Outpatient Visits           Yesterday [facility] ([facility] IncdTeodora MediciO   3 months ago Tobacco dependence   [facility]   6 months ago COPD GOLD 3   / still smoking    CHMAl[place]ambraO   7 months ago [facility] exacerb[facility]   11 months ago [facility] MerinoNP       Future Appointments             In 3 weeks Agbor-Etang, BriAaron EdelmanD Con[place]htonon[place]pointIn 1 month Wert, MicChristena DeemD [place] [facility] [place]   In 3 months AndTeodora MediciO [facility]             atorvastatin (LIPITOR) 40 MG tablet [Pharmacy Med Name: atorvastatin 40 mg tablet] 90 tablet 3    Sig: TAKE 1 TABLET BY MOUTH ONCE DAILY.     Cardiovascular:  Antilipid - Statins Failed - 03/29/2022  8:00 AM      Failed - Lipid Panel in normal range within the last 12 months  Cholesterol  Date Value Ref Range Status  09/01/2020 152 <200 mg/dL Final   LDL Cholesterol (Calc)  Date Value Ref Range Status  09/01/2020 83 mg/dL (calc) Final    Comment:    Reference range: <100 . Desirable range <100 mg/dL for primary prevention;   <70 mg/dL for patients with CHD or diabetic patients  with > or = 2 CHD risk factors. Marland Kitchen LDL-C is now calculated using the Martin-Hopkins  calculation, which is a validated novel method providing  better accuracy than the Friedewald equation in the  estimation of LDL-C.  Cresenciano Genre et al. Annamaria Helling. 4497;530(05): 2061-2068  (http://education.Quest[employer]ostics.com/faq/FAQ164)    HDL  Date Value Ref Range Status  09/01/2020 32 (L) > OR = 40 mg/dL Final   Triglycerides  Date Value Ref Range Status  09/01/2020 305 (H) <150 mg/dL Final    Comment:    . If a non-fasting specimen was collected, consider repeat triglyceride testing on a fasting specimen if clinically indicated.  Yates Decamp et al. J. of Clin. Lipidol. 1102;1:117-356. Marland Kitchen          Passed - Patient is not pregnant      Passed - Valid encounter  within last 12 months    Recent Outpatient Visits           Yesterday COPD mixed type [facility])   [place], DO   3 months ago Tobacco dependence   [facility] Teodora Medici, DO   6 months ago COPD GOLD 3   / still smoking    [place], DO   7 months ago COPD with acute exacerbation [facility])   [place], DO   11 months ago COPD with acute exacerbation [facility])   [facility] Bo Merino, FNP       Future Appointments             In 3 weeks Agbor-Etang, Aaron Edelman, MD [place]. [place]   In 1 month Wert, Christena Deem, MD [employer] [facility] [place]   In 3 months Teodora Medici, [facility], [facility]

## 2022-04-19 ENCOUNTER — Ambulatory Visit
Admission: RE | Admit: 2022-04-19 | Discharge: 2022-04-19 | Disposition: A | Discharge status: Home / Self Care | Payer: BC Managed Care – PPO | Source: Ambulatory Visit | Attending: Internal Medicine | Admitting: Internal Medicine

## 2022-04-19 DIAGNOSIS — F17200 Nicotine dependence, unspecified, uncomplicated: Secondary | ICD-10-CM

## 2022-04-19 DIAGNOSIS — Z87891 Personal history of nicotine dependence: Secondary | ICD-10-CM | POA: Diagnosis present

## 2022-04-20 ENCOUNTER — Ambulatory Visit: Payer: BC Managed Care – PPO | Attending: Cardiology | Admitting: Cardiology

## 2022-04-20 ENCOUNTER — Encounter: Payer: Self-pay | Admitting: Cardiology

## 2022-04-20 ENCOUNTER — Other Ambulatory Visit
Admission: RE | Admit: 2022-04-20 | Discharge: 2022-04-20 | Disposition: A | Discharge status: Home / Self Care | Payer: BC Managed Care – PPO | Source: Ambulatory Visit | Attending: Cardiology | Admitting: Cardiology

## 2022-04-20 VITALS — BP 130/84 | HR 63 | Ht 66.0 in | Wt 184.0 lb

## 2022-04-20 DIAGNOSIS — I10 Essential (primary) hypertension: Secondary | ICD-10-CM

## 2022-04-20 DIAGNOSIS — R072 Precordial pain: Secondary | ICD-10-CM | POA: Diagnosis not present

## 2022-04-20 DIAGNOSIS — I2584 Coronary atherosclerosis due to calcified coronary lesion: Secondary | ICD-10-CM

## 2022-04-20 DIAGNOSIS — R079 Chest pain, unspecified: Secondary | ICD-10-CM

## 2022-04-20 DIAGNOSIS — I2510 Atherosclerotic heart disease of native coronary artery without angina pectoris: Secondary | ICD-10-CM

## 2022-04-20 DIAGNOSIS — E782 Mixed hyperlipidemia: Secondary | ICD-10-CM | POA: Diagnosis not present

## 2022-04-20 LAB — BASIC METABOLIC PANEL
Anion gap: 8 (ref 5–15)
BUN: 9 mg/dL (ref 8–23)
CO2: 31 mmol/L (ref 22–32)
Calcium: 9.1 mg/dL (ref 8.9–10.3)
Chloride: 99 mmol/L (ref 98–111)
Creatinine, Ser: 1.87 mg/dL — ABNORMAL HIGH (ref 0.61–1.24)
GFR, Estimated: 40 mL/min — ABNORMAL LOW (ref >60)
Glucose, Bld: 115 mg/dL — ABNORMAL HIGH (ref 70–99)
Potassium: 3.8 mmol/L (ref 3.5–5.1)
Sodium: 138 mmol/L (ref 135–145)

## 2022-04-20 MED ORDER — METOPROLOL TARTRATE 100 MG PO TABS: ORAL_TABLET | ORAL | 0 refills | Status: DC

## 2022-04-20 NOTE — Patient Instructions (Signed)
Medication Instructions:   Your physician recommends that you continue on your current medications as directed. Please refer to the Current Medication list given to you today.  *If you need a refill on your cardiac medications before your next appointment, please call your pharmacy*   Lab Work:  Your physician recommends you go to the medical mall to have lab work completed.   If you have labs (blood work) drawn today and your tests are completely normal, you will receive your results only by: [place] (if you have MyChart) OR A paper copy in the mail If you have any lab test that is abnormal or we need to change your treatment, we will call you to review the results.   Testing/Procedures:  Your physician has requested that you have an echocardiogram. Echocardiography is a painless test that uses sound waves to create images of your heart. It provides your doctor with information about the size and shape of your heart and how well your heart's chambers and valves are working. This procedure takes approximately one hour. There are no restrictions for this procedure. Please do NOT wear cologne, perfume, aftershave, or lotions (deodorant is allowed). Please arrive 15 minutes prior to your appointment time.    Your cardiac CT will be scheduled at  [facility] [address] [place], [place] [address] [phone]  If scheduled at [facility] or [facility], please arrive 15 mins early for check-in and test prep.   Please follow these instructions carefully (unless otherwise directed):  Hold all erectile dysfunction medications at least 3 days (72 hrs) prior to test. (Ie viagra, cialis, sildenafil, tadalafil, etc) We will administer nitroglycerin during this exam.   On the Night Before the Test: Be sure to Drink plenty of water. Do not consume any caffeinated/decaffeinated beverages  or chocolate 12 hours prior to your test. Do not take any antihistamines 12 hours prior to your test.  On the Day of the Test: Drink plenty of water until 1 hour prior to the test. Do not eat any food 1 hour prior to test. Take metoprolol (Lopressor) two hours prior to test. HOLD AMLODIPINE morning of the test. You may take your regular medications prior to the test.   After the Test: Drink plenty of water. After receiving IV contrast, you may experience a mild flushed feeling. This is normal. On occasion, you may experience a mild rash up to 24 hours after the test. This is not dangerous. If this occurs, you can take Benadryl 25 mg and increase your fluid intake. If you experience trouble breathing, this can be serious. If it is severe call 911 IMMEDIATELY. If it is mild, please call our office. If you take any of these medications: Glipizide/Metformin, Avandament, Glucavance, please do not take 48 hours after completing test unless otherwise instructed.  We will call to schedule your test 2-4 weeks out understanding that some insurance companies will need an authorization prior to the service being performed.   For non-scheduling related questions, please contact the cardiac imaging nurse navigator should you have any questions/concerns: Marchia Bond, Cardiac Imaging Nurse Navigator Gordy Clement, Cardiac Imaging Nurse Navigator [facility] Direct Office Dial: [phone]   For scheduling needs, including cancellations and rescheduling, please call [place], [phone].    Follow-Up: At [facility], you and your health needs are our priority.  As part of our continuing mission to provide you with exceptional heart  care, we have created designated Provider Care Teams.  These Care Teams include your primary Cardiologist (physician) and Advanced Practice Providers (APPs -  Physician Assistants and Nurse Practitioners) who all work together to  provide you with the care you need, when you need it.  We recommend signing up for the patient portal called "MyChart".  Sign up information is provided on this After Visit Summary.  MyChart is used to connect with patients for Virtual Visits (Telemedicine).  Patients are able to view lab/test results, encounter notes, upcoming appointments, etc.  Non-urgent messages can be sent to your provider as well.   To learn more about what you can do with MyChart, go to [email or web address].    Your next appointment:   8 - 10 week(s)  Provider:   You may see Kate Sable, MD or one of the following Advanced Practice Providers on your designated Care Team:   Murray Hodgkins, NP Christell Faith, PA-C Cadence Kathlen Mody, PA-C Gerrie Nordmann, NP

## 2022-04-20 NOTE — Progress Notes (Signed)
Cardiology Office Note:    Date:  04/20/2022   ID:  Zachary Wallace, DOB 1955/04/02, MRN [id number]  PCP:  Teodora Medici, [place] Prov[facility] Cardiologist:  Kate Sable, MD     Referring MD: Melvenia Needles, NP   Chief Complaint  Patient presents with   New Patient (Initial Visit)    SOB, No Hx, Family Hx   Zachary Wallace is a 67 y.o. male who is being seen today for the evaluation of shortness of breath at the request of Parrett, Fonnie Mu, NP.   History of Present Illness:    Zachary Wallace is a 67 y.o. male with a hx of hypertension, hyperlipidemia, diabetes, former smoker x 40 years, COPD presenting with chest pain or shortness of breath.  Endorse symptoms of chest pain ongoing for about 6 months now.  Symptoms are sometimes associated with exertion.  Also complains of shortness of breath.  Father passed from a heart attack, he is unsure of age.  Wants to make sure his heart is okay.  Quit smoking a month ago, takes medications as prescribed.  Chest CT lung cancer screening 04/19/2022 showed coronary calcifications.  Past Medical History:  Diagnosis Date   COPD (chronic obstructive pulmonary disease) ([place])    Diabetes mellitus without complication ([place])    Hyperlipidemia    Hypertension    OSA (obstructive sleep apnea)     Past Surgical History:  Procedure Laterality Date   COLONOSCOPY WITH PROPOFOL N/A 02/04/2018   Procedure: COLONOSCOPY WITH PROPOFOL;  Surgeon: Lin Landsman, MD;  Location: [facility] ENDOSCOPY;  Service: Gastroenterology;  Laterality: N/A;   HAND SURGERY Left    KNEE SURGERY Left     Current Medications: Current Meds  Medication Sig   albuterol (PROVENTIL) (2.5 MG/3ML) 0.083% nebulizer solution Take 3 mLs (2.5 mg total) by nebulization every 6 (six) hours as needed for wheezing or shortness of breath.   albuterol (VENTOLIN HFA) 108 (90 Base) MCG/ACT inhaler Inhale 1-2 puffs into the lungs every 6 (six) hours as needed for  wheezing or shortness of breath.   amLODipine (NORVASC) 10 MG tablet Take 1 tablet (10 mg total) by mouth daily.   aspirin 81 MG tablet Take 81 mg by mouth.   atorvastatin (LIPITOR) 40 MG tablet Take 1 tablet (40 mg total) by mouth daily.   B-D UF III MINI PEN NEEDLES 31G X 5 MM MISC    bisoprolol (ZEBETA) 5 MG tablet One twice daily   BREZTRI AEROSPHERE 160-9-4.8 MCG/ACT AERO INHALE 2 PUFFS BY MOUTH TWICE DAILY   Dulaglutide 0.75 MG/0.5ML SOPN Inject 0.5 mg into the skin once a week.   famotidine (PEPCID) 20 MG tablet One after supper   glucose blood (ONETOUCH VERIO) test strip    insulin degludec (TRESIBA) 100 UNIT/ML FlexTouch Pen Inject into the skin.   losartan-hydrochlorothiazide (HYZAAR) 100-12.5 MG tablet Take 1 tablet by mouth daily.   metoprolol tartrate (LOPRESSOR) 100 MG tablet Take one tablet (100 mg) two hours prior to Cardiac CT   nicotine (NICODERM CQ - DOSED IN MG/24 HR) 7 mg/24hr patch Place 1 patch (7 mg total) onto the skin daily.   pantoprazole (PROTONIX) 40 MG tablet Take 1 tablet (40 mg total) by mouth daily. Take 30-60 min before first meal of the day   Respiratory Therapy Supplies (NEBULIZER/TUBING/MOUTHPIECE) KIT Disp one nebulizer machine, tubing set and mouthpiece kit   sildenafil (VIAGRA) 100 MG tablet Take 1 tablet (100 mg total) by  mouth as needed for erectile dysfunction.     Allergies:   Patient has no known allergies.   Social History   Socioeconomic History   Marital status: Married    Spouse name: Not on file   Number of children: Not on file   Years of education: Not on file   Highest education level: Not on file  Occupational History   Occupation: retires custodian [employer]  Tobacco Use   Smoking status: Former    Packs/day: 1.00    Years: 41.00    Total pack years: 41.00    Types: Cigarettes   Smokeless tobacco: Never   Tobacco comments:    Smoked last on 12/11.  Trying to quit.   Using patches.  HFB  03/13/2022  Vaping Use    Vaping Use: Never used  Substance and Sexual Activity   Alcohol use: No    Alcohol/week: 0.0 standard drinks of alcohol   Drug use: No   Sexual activity: Yes    Partners: Male, Male  Other Topics Concern   Not on file  Social History Narrative   Not on file   Social Determinants of Health   Financial Resource Strain: Low Risk  (09/01/2020)   Overall Financial Resource Strain (CARDIA)    Difficulty of Paying Living Expenses: Not hard at all  Food Insecurity: No Food Insecurity (09/01/2020)   Hunger Vital Sign    Worried About Running Out of Food in the Last Year: Never true    Ran Out of Food in the Last Year: Never true  Transportation Needs: No Transportation Needs (09/01/2020)   PRAPARE - Hydrologist (Medical): No    Lack of Transportation (Non-Medical): No  Physical Activity: Insufficiently Active (09/01/2020)   Exercise Vital Sign    Days of Exercise per Week: 3 days    Minutes of Exercise per Session: 30 min  Stress: No Stress Concern Present (09/01/2020)   [place]    Feeling of Stress : Only a little  Social Connections: Socially Integrated (09/01/2020)   Social Connection and Isolation Panel [NHANES]    Frequency of Communication with Friends and Family: More than three times a week    Frequency of Social Gatherings with Friends and Family: More than three times a week    Attends Religious Services: More than 4 times per year    Active Member of [employer] or Organizations: Yes    Attends Music therapist: More than 4 times per year    Marital Status: Married     Family History: The patient's family history includes Clotting disorder in his mother; Diabetes in his mother; Heart attack in his father; Hypertension in his brother, mother, and sister; Vision loss in his brother.  ROS:   Please see the history of present illness.     All other systems reviewed and are  negative.  EKGs/Labs/Other Studies Reviewed:    The following studies were reviewed today:   EKG:  EKG is  ordered today.  The ekg ordered today demonstrates normal sinus rhythm, normal ECG.  Recent Labs: No results found for requested labs within last 365 days.  Recent Lipid Panel    Component Value Date/Time   CHOL 152 09/01/2020 1015   TRIG 305 (H) 09/01/2020 1015   HDL 32 (L) 09/01/2020 1015   CHOLHDL 4.8 09/01/2020 1015   VLDL 27 10/05/2016 1524   LDLCALC 83 09/01/2020 1015  Recent outside lipid panel 09/2021 total cholesterol 97, triglyceride 181, LDL 22  Risk Assessment/Calculations:             Physical Exam:    VS:  BP 130/84 (BP Location: Right Arm)   Pulse 63   Ht '5\' 6"'$  (1.676 m)   Wt 184 lb (83.5 kg)   SpO2 93%   BMI 29.70 kg/m     Wt Readings from Last 3 Encounters:  04/20/22 184 lb (83.5 kg)  04/19/22 180 lb (81.6 kg)  03/28/22 180 lb 4.8 oz (81.8 kg)     GEN: Well nourished, well developed in no acute distress HEENT: Normal NECK: No JVD; No carotid bruits CARDIAC: RRR, no murmurs, rubs, gallops RESPIRATORY: Mild left lung expiratory wheezing ABDOMEN: Soft, non-tender, non-distended MUSCULOSKELETAL:  No edema; No deformity  SKIN: Warm and dry NEUROLOGIC:  Alert and oriented x 3 PSYCHIATRIC:  Normal affect   ASSESSMENT:    1. Precordial pain   2. Primary hypertension   3. Mixed hyperlipidemia   4. Coronary artery calcification   5. Chest pain, unspecified type    PLAN:    In order of problems listed above:  Chest pain, shortness of breath.  Coronary calcifications on chest CT.  Risk factors former smoker hypertension, hyperlipidemia.  Get echo, get coronary CTA to evaluate obstructive CAD. Hypertension, BP controlled.  Continue current BP meds. Hyperlipidemia, continue Lipitor 40.  Cholesterol control, LDL at goal. Coronary calcifications, continue aspirin 81 mg, Lipitor 40.  Echo and coronary CTA as above.  Follow-up after cardiac  testing       Medication Adjustments/Labs and Tests Ordered: Current medicines are reviewed at length with the patient today.  Concerns regarding medicines are outlined above.  Orders Placed This Encounter  Procedures   CT CORONARY MORPH W/CTA COR W/SCORE W/CA W/CM &/OR WO/CM   Basic Metabolic Panel (BMET)   EKG 12-Lead   ECHOCARDIOGRAM COMPLETE   Meds ordered this encounter  Medications   metoprolol tartrate (LOPRESSOR) 100 MG tablet    Sig: Take one tablet (100 mg) two hours prior to Cardiac CT    Dispense:  1 tablet    Refill:  0    Patient Instructions  Medication Instructions:   Your physician recommends that you continue on your current medications as directed. Please refer to the Current Medication list given to you today.  *If you need a refill on your cardiac medications before your next appointment, please call your pharmacy*   Lab Work:  Your physician recommends you go to the medical mall to have lab work completed.   If you have labs (blood work) drawn today and your tests are completely normal, you will receive your results only by: [place] (if you have MyChart) OR A paper copy in the mail If you have any lab test that is abnormal or we need to change your treatment, we will call you to review the results.   Testing/Procedures:  Your physician has requested that you have an echocardiogram. Echocardiography is a painless test that uses sound waves to create images of your heart. It provides your doctor with information about the size and shape of your heart and how well your heart's chambers and valves are working. This procedure takes approximately one hour. There are no restrictions for this procedure. Please do NOT wear cologne, perfume, aftershave, or lotions (deodorant is allowed). Please arrive 15 minutes prior to your appointment time.    Your cardiac CT will be scheduled at  [facility] [address] [place], [place] [address] [phone]  If scheduled at [facility] or [facility], please arrive 15 mins early for check-in and test prep.   Please follow these instructions carefully (unless otherwise directed):  Hold all erectile dysfunction medications at least 3 days (72 hrs) prior to test. (Ie viagra, cialis, sildenafil, tadalafil, etc) We will administer nitroglycerin during this exam.   On the Night Before the Test: Be sure to Drink plenty of water. Do not consume any caffeinated/decaffeinated beverages or chocolate 12 hours prior to your test. Do not take any antihistamines 12 hours prior to your test.  On the Day of the Test: Drink plenty of water until 1 hour prior to the test. Do not eat any food 1 hour prior to test. Take metoprolol (Lopressor) two hours prior to test. HOLD AMLODIPINE morning of the test. You may take your regular medications prior to the test.   After the Test: Drink plenty of water. After receiving IV contrast, you may experience a mild flushed feeling. This is normal. On occasion, you may experience a mild rash up to 24 hours after the test. This is not dangerous. If this occurs, you can take Benadryl 25 mg and increase your fluid intake. If you experience trouble breathing, this can be serious. If it is severe call 911 IMMEDIATELY. If it is mild, please call our office. If you take any of these medications: Glipizide/Metformin, Avandament, Glucavance, please do not take 48 hours after completing test unless otherwise instructed.  We will call to schedule your test 2-4 weeks out understanding that some insurance companies will need an authorization prior to the service being performed.   For non-scheduling related questions, please contact the cardiac imaging nurse navigator should you have any questions/concerns: Marchia Bond, Cardiac Imaging Nurse Navigator Gordy Clement, Cardiac Imaging  Nurse Navigator [place] [facility] Direct Office Dial: [phone]   For scheduling needs, including cancellations and rescheduling, please call [place], [phone].    Follow-Up: At [facility], you and your health needs are our priority.  As part of our continuing mission to provide you with exceptional heart care, we have created designated Provider Care Teams.  These Care Teams include your primary Cardiologist (physician) and Advanced Practice Providers (APPs -  Physician Assistants and Nurse Practitioners) who all work together to provide you with the care you need, when you need it.  We recommend signing up for the patient portal called "MyChart".  Sign up information is provided on this After Visit Summary.  MyChart is used to connect with patients for Virtual Visits (Telemedicine).  Patients are able to view lab/test results, encounter notes, upcoming appointments, etc.  Non-urgent messages can be sent to your provider as well.   To learn more about what you can do with MyChart, go to [email or web address].    Your next appointment:   8 - 10 week(s)  Provider:   You may see Kate Sable, MD or one of the following Advanced Practice Providers on your designated Care Team:   Murray Hodgkins, NP Christell Faith, PA-C Cadence Kathlen Mody, PA-C Gerrie Nordmann, NP   Signed, Kate Sable, MD  04/20/2022 12:33 PM    [place]

## 2022-04-23 ENCOUNTER — Other Ambulatory Visit: Payer: Self-pay

## 2022-04-23 ENCOUNTER — Telehealth: Payer: Self-pay | Admitting: Cardiology

## 2022-04-23 DIAGNOSIS — R079 Chest pain, unspecified: Secondary | ICD-10-CM

## 2022-04-23 DIAGNOSIS — Z122 Encounter for screening for malignant neoplasm of respiratory organs: Secondary | ICD-10-CM

## 2022-04-23 DIAGNOSIS — Z87891 Personal history of nicotine dependence: Secondary | ICD-10-CM

## 2022-04-23 NOTE — Telephone Encounter (Addendum)
-  Reviewed results with patient.  - Patient was agreeable to plan.  - Patient would like instructions mailed to the address on file.  Myoview scheduled for 2/8 at 9 am arrive at [facility] by 8:45  Cardiac CTA Cancelled.

## 2022-04-23 NOTE — Telephone Encounter (Signed)
Patient returned call for lab results.

## 2022-04-27 ENCOUNTER — Telehealth: Payer: Self-pay | Admitting: Cardiology

## 2022-04-27 ENCOUNTER — Other Ambulatory Visit: Payer: Self-pay

## 2022-04-27 NOTE — Telephone Encounter (Signed)
Pt c/o medication issue:  1. Name of Medication: Metoprolol  2. How are you currently taking this medication (dosage and times per day)?   3. Are you having a reaction (difficulty breathing--STAT)?   4. What is your medication issue? Wants to know if he needs to take his Metoprolol before his Stress Test on 05-03-22?

## 2022-04-27 NOTE — Telephone Encounter (Signed)
Spoke to patient who has question on taking metoprolol prior to procedure. Informed patient he did not have to take the metoprolol prior. Patient stated that he understood but just wanted to call and make sure.

## 2022-05-03 ENCOUNTER — Ambulatory Visit
Admission: RE | Admit: 2022-05-03 | Discharge: 2022-05-03 | Disposition: A | Discharge status: Home / Self Care | Payer: BC Managed Care – PPO | Source: Ambulatory Visit | Attending: Cardiology | Admitting: Cardiology

## 2022-05-03 DIAGNOSIS — R079 Chest pain, unspecified: Secondary | ICD-10-CM | POA: Diagnosis not present

## 2022-05-03 MED ORDER — REGADENOSON 0.4 MG/5ML IV SOLN
0.4000 mg | Freq: Once | INTRAVENOUS | Status: AC
  Administered 2022-05-03: 0.4 mg via INTRAVENOUS

## 2022-05-03 MED ORDER — TECHNETIUM TC 99M TETROFOSMIN IV KIT
32.6500 | PACK | Freq: Once | INTRAVENOUS | Status: AC | PRN
  Administered 2022-05-03: 32.65 via INTRAVENOUS

## 2022-05-03 MED ORDER — TECHNETIUM TC 99M TETROFOSMIN IV KIT
10.0000 | PACK | Freq: Once | INTRAVENOUS | Status: AC | PRN
  Administered 2022-05-03: 10.39 via INTRAVENOUS

## 2022-05-04 ENCOUNTER — Ambulatory Visit: Payer: BC Managed Care – PPO | Admitting: Internal Medicine

## 2022-05-04 LAB — NM MYOCAR MULTI W/SPECT W/WALL MOTION / EF
LV dias vol: 72 mL (ref 62–150)
LV sys vol: 22 mL
Nuc Stress EF: 69 %
Peak HR: 82 {beats}/min
Rest HR: 58 {beats}/min
Rest Nuclear Isotope Dose: 10.4 mCi
SDS: 0
SRS: 16
SSS: 0
ST Depression (mm): 0 mm
Stress Nuclear Isotope Dose: 32.7 mCi
TID: 0.88

## 2022-05-07 ENCOUNTER — Ambulatory Visit: Payer: BC Managed Care – PPO

## 2022-05-18 ENCOUNTER — Ambulatory Visit: Payer: BC Managed Care – PPO | Admitting: Internal Medicine

## 2022-05-24 ENCOUNTER — Encounter: Payer: Self-pay | Admitting: Internal Medicine

## 2022-05-24 ENCOUNTER — Ambulatory Visit
Admission: RE | Admit: 2022-05-24 | Discharge: 2022-05-24 | Disposition: A | Discharge status: Home / Self Care | Payer: BC Managed Care – PPO | Source: Ambulatory Visit | Attending: Internal Medicine | Admitting: Internal Medicine

## 2022-05-24 ENCOUNTER — Ambulatory Visit (INDEPENDENT_AMBULATORY_CARE_PROVIDER_SITE_OTHER): Payer: BC Managed Care – PPO | Admitting: Internal Medicine

## 2022-05-24 ENCOUNTER — Ambulatory Visit
Admission: RE | Admit: 2022-05-24 | Discharge: 2022-05-24 | Disposition: A | Discharge status: Home / Self Care | Payer: BC Managed Care – PPO | Attending: Internal Medicine | Admitting: Internal Medicine

## 2022-05-24 VITALS — BP 132/76 | HR 82 | Resp 16 | Ht 66.0 in | Wt 188.0 lb

## 2022-05-24 DIAGNOSIS — R051 Acute cough: Secondary | ICD-10-CM | POA: Diagnosis present

## 2022-05-24 DIAGNOSIS — J441 Chronic obstructive pulmonary disease with (acute) exacerbation: Secondary | ICD-10-CM | POA: Diagnosis not present

## 2022-05-24 DIAGNOSIS — R062 Wheezing: Secondary | ICD-10-CM | POA: Insufficient documentation

## 2022-05-24 DIAGNOSIS — S39012A Strain of muscle, fascia and tendon of lower back, initial encounter: Secondary | ICD-10-CM

## 2022-05-24 MED ORDER — GUAIFENESIN ER 600 MG PO TB12: 600.0000 mg | ORAL_TABLET | Freq: Every day | ORAL | 0 refills | Status: DC

## 2022-05-24 MED ORDER — BENZONATATE 100 MG PO CAPS: 100.0000 mg | ORAL_CAPSULE | Freq: Two times a day (BID) | ORAL | 0 refills | Status: DC | PRN

## 2022-05-24 MED ORDER — PREDNISONE 20 MG PO TABS: 40.0000 mg | ORAL_TABLET | Freq: Every day | ORAL | 0 refills | Status: AC

## 2022-05-24 NOTE — Progress Notes (Signed)
Acute Office Visit  Subjective:     Patient ID: Zachary Wallace, male    DOB: 1955-08-28, 67 y.o.   MRN: [id number]  Chief Complaint  Patient presents with   Back Pain    Caused by coughing.   Cough    X1 week, productive    HPI Patient is in today for cough and back pain. Cough started about 5 days ago. Is productive with thick white mucus. Quit smoking in December. Cough is making lower back hurt. Using [place].  URI Compliant:  -Fever: no -Cough: yes, productive white thick  -Shortness of breath: no -Wheezing: no -Chest tightness: no -Chest congestion: no -Nasal congestion: no -Runny nose: yes, clear  -Post nasal drip: no -Sore throat: no -Sinus pressure: no -Headache: no -Face pain: no -Ear pain: no  -Ear pressure:  no -Sick contacts: no -Context: worse -Treatments attempted: Breztri, Albuterol (using once a day)    Review of Systems  Constitutional:  Negative for chills and fever.  HENT:  Negative for congestion, ear pain, sinus pain and sore throat.   Respiratory:  Positive for cough, sputum production and wheezing. Negative for shortness of breath.   Cardiovascular:  Negative for chest pain.  Musculoskeletal:  Positive for back pain.        Objective:    BP 132/76   Pulse 82   Resp 16   Ht '5\' 6"'$  (1.676 m)   Wt 188 lb (85.3 kg)   SpO2 94%   BMI 30.34 kg/m  BP Readings from Last 3 Encounters:  05/24/22 132/76  04/20/22 130/84  03/28/22 116/62   Wt Readings from Last 3 Encounters:  05/24/22 188 lb (85.3 kg)  04/20/22 184 lb (83.5 kg)  04/19/22 180 lb (81.6 kg)      Physical Exam Constitutional:      Appearance: Normal appearance.  HENT:     Head: Normocephalic and atraumatic.  Eyes:     Conjunctiva/sclera: Conjunctivae normal.  Cardiovascular:     Rate and Rhythm: Normal rate and regular rhythm.  Pulmonary:     Effort: Pulmonary effort is normal.     Breath sounds: Wheezing present.     Comments: Decreased air movement throughout   Musculoskeletal:     Lumbar back: Tenderness present. No bony tenderness.  Skin:    General: Skin is warm and dry.  Neurological:     General: No focal deficit present.     Mental Status: He is alert. Mental status is at baseline.  Psychiatric:        Mood and Affect: Mood normal.        Behavior: Behavior normal.     No results found for any visits on 05/24/22.      Assessment & Plan:   1. COPD with acute exacerbation (HCC)/Acute cough/Wheeze: Lungs tight with wheezing. Quit smoking in December which is excellent, efforts congratulated. Will send for chest x-ray today to rule out pneumonia. Treat with Prednisone 40 mg x 5 days. Take Mucinex in the morning, cough suppressant at night.   - guaiFENesin (MUCINEX) 600 MG 12 hr tablet; Take 1 tablet (600 mg total) by mouth daily.  Dispense: 30 tablet; Refill: 0 - benzonatate (TESSALON) 100 MG capsule; Take 1 capsule (100 mg total) by mouth 2 (two) times daily as needed for cough.  Dispense: 20 capsule; Refill: 0 - predniSONE (DELTASONE) 20 MG tablet; Take 2 tablets (40 mg total) by mouth daily with breakfast for 5 days.  Dispense: 10 tablet; Refill:  0 - DG Chest 2 View; Future  2. Strain of lumbar region, initial encounter: From coughing, Prednisone with help with inflammation. Discussed moist heat and topical medications as well.    Return for already scheduled .  Teodora Medici, DO

## 2022-05-24 NOTE — Patient Instructions (Addendum)
It was great seeing you today!  Plan discussed at today's visit: -Take steroids for 5 days to help with cough/wheezing and back pain -Take Mucinex [phone] mg in the morning to help with chest congestion -Cough suppressant sent to pharmacy as well to take at night  -Chest x-ray today   Follow up in: April, already scheduled   Take care and let us know if you have any questions or concerns prior to your next visit.  Dr. Rosana Berger

## 2022-06-01 ENCOUNTER — Ambulatory Visit (INDEPENDENT_AMBULATORY_CARE_PROVIDER_SITE_OTHER): Payer: BC Managed Care – PPO | Admitting: Internal Medicine

## 2022-06-01 ENCOUNTER — Encounter: Payer: Self-pay | Admitting: Internal Medicine

## 2022-06-01 VITALS — BP 110/80 | HR 71 | Temp 97.7°F | Ht 66.0 in | Wt 185.2 lb

## 2022-06-01 DIAGNOSIS — J449 Chronic obstructive pulmonary disease, unspecified: Secondary | ICD-10-CM

## 2022-06-01 NOTE — Addendum Note (Signed)
Addended by: Christinia Gully B on: 06/01/2022 02:25 PM   Modules accepted: Level of Service

## 2022-06-01 NOTE — Addendum Note (Signed)
Addended by: Christinia Gully B on: 06/01/2022 02:23 PM   Modules accepted: Level of Service

## 2022-06-01 NOTE — Assessment & Plan Note (Signed)
Quit smoking Dec 2023  - emphysema noted on chest CT Dec 2018 - PFT's  08/13/17  FEV1 0.66  (22 % ) ratio 0.26  p 13 % improvement from saba p ? nothing prior to study with DLCO  10.3 (44%)  and FV curve classic concavity   - PFT's  08/17/21   FEV1 0.95 (32 % ) ratio 0.41  p 6 % improvement from saba p breztri prior to study with DLCO  11.9 (42%)  FV curve classically convcave - 09/01/2021   continue breztri plus approp saba and short term gerd rx due to upper airway wheeze on exam    - 01/11/2022    breztri and gerd rx > no longer upper airway wheeze 06/01/2022  - 06/01/2022  After extensive coaching inhaler device,  effectiveness =    80% (short ti)    Group D (now reclassified as E) in terms of symptom/risk and laba/lama/ICS  therefore appropriate rx at this point >>>  breztri and more approp saba   Re SABA :  I spent extra time with pt today reviewing appropriate use of albuterol for prn use on exertion with the following points: 1) saba is for relief of sob that does not improve by walking a slower pace or resting but rather if the pt does not improve after trying this first. 2) If the pt is convinced, as many are, that saba helps recover from activity faster then it's easy to tell if this is the case by re-challenging : ie stop, take the inhaler, then p 5 minutes try the exact same activity (intensity of workload) that just caused the symptoms and see if they are substantially diminished or not after saba 3) if there is an activity that reproducibly causes the symptoms, try the saba 15 min before the activity on alternate days   If in fact the saba really does help, then fine to continue to use it prn but advised may need to look closer at the maintenance regimen being used to achieve better control of airways disease with exertion.           Each maintenance medication was reviewed in detail including emphasizing most importantly the difference between maintenance and prns and under what  circumstances the prns are to be triggered using an action plan format where appropriate.  Total time for H and P, chart review, counseling, reviewing hfa  device(s) and generating customized AVS unique to this office visit / same day charting = 23 min

## 2022-06-01 NOTE — Progress Notes (Signed)
Zachary Wallace, male    DOB: October 08, 1955  MRN: [id number]   Brief patient profile:  59  yobm quit smoking Dec 2023  reported dx of asthma in his 64s referred to pulmonary clinic in [facility]  06/02/2021 by  Delsa Grana PA at [facility] for copd eval p exac 04/07/21 and placed on breztri which has helped despite poor hfa technique    History of Present Illness  06/02/2021  Pulmonary/ 1st office eval/ Zachary Wallace / [facility] / maint on [employer] Complaint  Patient presents with   Consult  Dyspnea:  can do front yard behind a self propelled mower s stopping x 30  Cough: after lies down but does fine flat and no am flare > white mucus  Sleep: one pillow  Rec Plan A = Automatic = Always=    Breztri Take 2 puffs first thing in am and then another 2 puffs about 12 hours later.  Work on inhaler technique:   Plan B = Backup (to supplement plan A, not to replace it) Only use your albuterol inhaler as a rescue medication Plan C = Crisis (instead of Plan B but only if Plan B stops working) - only use your albuterol nebulizer if you first try Plan B  Please schedule a follow up visit in 3 months but call sooner if needed with pfts on return        01/11/2022  f/u ov/Zachary Wallace/ [facility] re: GOLD 3    maint on breztri no ppi    Chief Complaint  Patient presents with   Follow-up   Dyspnea:  [employer] ok, a bit tight this am Cough: min mucoid  Sleeping: flat bed ok now s resp cc  SABA use: not using neb now / hfa still suboptimal technique 02: none  Covid status:   vax x 2  Rec Plan A = Automatic = Always=    breztri Take 2 puffs first thing in am and then another 2 puffs about 12 hours later.   Work on inhaler technique:  Plan B = Backup (to supplement plan A, not to replace it) Only use your albuterol inhaler as a rescue medication   Plan C = Crisis (instead of Plan B but only if Plan B stops working) - only use your albuterol nebulizer if you first try Plan B      06/01/2022   f/u ov/Zachary Wallace/ [facility] re: gold 3  maint on breztri 2bid   Chief Complaint  Patient presents with   Follow-up    No SOB or cough. Some wheezing.    Dyspnea:  same pace as others at [employer]/ cross parking lot ok / self propelled mower x 30 min  Cough: better now / white esp noticeable in am x 30 min  Sleeping: 2 pillows flat bed SABA use: about twice daily eg p raking  02: none  Lung cancer sreening  q jan    No obvious day to day or daytime variability or assoc  purulent sputum or mucus plugs or hemoptysis or cp or chest tightness,  or overt sinus or hb symptoms.   Sleeping  without nocturnal  or early am exacerbation  of respiratory  c/o's or need for noct saba. Also denies any obvious fluctuation of symptoms with weather or environmental changes or other aggravating or alleviating factors except as outlined above   No unusual exposure hx or h/o childhood pna/ asthma or knowledge of premature birth.  Current Allergies,  Complete Past Medical History, Past Surgical History, Family History, and Social History were reviewed in [employer] record.  ROS  The following are not active complaints unless bolded Hoarseness, sore throat, dysphagia, dental problems, itching, sneezing,  nasal congestion or discharge of excess mucus or purulent secretions, ear ache,   fever, chills, sweats, unintended wt loss or wt gain, classically pleuritic or exertional cp,  orthopnea pnd or arm/hand swelling  or leg swelling, presyncope, palpitations, abdominal pain, anorexia, nausea, vomiting, diarrhea  or change in bowel habits or change in bladder habits, change in stools or change in urine, dysuria, hematuria,  rash, arthralgias, visual complaints, headache, numbness, weakness or ataxia or problems with walking or coordination,  change in mood or  memory.        Current Meds  Medication Sig   albuterol (PROVENTIL) (2.5 MG/3ML) 0.083% nebulizer solution Take 3 mLs (2.5 mg total)  by nebulization every 6 (six) hours as needed for wheezing or shortness of breath.   albuterol (VENTOLIN HFA) 108 (90 Base) MCG/ACT inhaler Inhale 1-2 puffs into the lungs every 6 (six) hours as needed for wheezing or shortness of breath.   amLODipine (NORVASC) 10 MG tablet Take 1 tablet (10 mg total) by mouth daily.   aspirin 81 MG tablet Take 81 mg by mouth.   atorvastatin (LIPITOR) 40 MG tablet Take 1 tablet (40 mg total) by mouth daily.   B-D UF III MINI PEN NEEDLES 31G X 5 MM MISC    benzonatate (TESSALON) 100 MG capsule Take 1 capsule (100 mg total) by mouth 2 (two) times daily as needed for cough.   bisoprolol (ZEBETA) 5 MG tablet One twice daily   BREZTRI AEROSPHERE 160-9-4.8 MCG/ACT AERO INHALE 2 PUFFS BY MOUTH TWICE DAILY   Dulaglutide 0.75 MG/0.5ML SOPN Inject 0.5 mg into the skin once a week.   famotidine (PEPCID) 20 MG tablet One after supper   glucose blood (ONETOUCH VERIO) test strip    guaiFENesin (MUCINEX) 600 MG 12 hr tablet Take 1 tablet (600 mg total) by mouth daily.   insulin degludec (TRESIBA) 100 UNIT/ML FlexTouch Pen Inject into the skin.   losartan-hydrochlorothiazide (HYZAAR) 100-12.5 MG tablet Take 1 tablet by mouth daily.   nicotine (NICODERM CQ - DOSED IN MG/24 HR) 7 mg/24hr patch Place 1 patch (7 mg total) onto the skin daily.   pantoprazole (PROTONIX) 40 MG tablet Take 1 tablet (40 mg total) by mouth daily. Take 30-60 min before first meal of the day   Respiratory Therapy Supplies (NEBULIZER/TUBING/MOUTHPIECE) KIT Disp one nebulizer machine, tubing set and mouthpiece kit   sildenafil (VIAGRA) 100 MG tablet Take 1 tablet (100 mg total) by mouth as needed for erectile dysfunction.                         Past Medical History:  Diagnosis Date   COPD (chronic obstructive pulmonary disease) ([place])    Diabetes mellitus without complication (HCC)    Hyperlipidemia    Hypertension    OSA (obstructive sleep apnea)        Objective:    wts  06/01/2022           185   01/11/2022      177  12/14/2021       176  11/17/2021       177   09/01/21 187 lb (84.8 kg)  08/18/21 185 lb 3.2 oz (84 kg)  06/02/21 182 lb 9.6 oz (82.8  kg)    Vital signs reviewed  06/01/2022  - Note at rest 02 sats  91% on RA   General appearance:    pleasant amb mildly obese bm nad    HEENT :  Oropharynx  clear/ edentulous      NECK :  without JVD/Nodes/TM/ nl carotid upstrokes bilaterally   LUNGS: no acc muscle use,  Mod barrel  contour chest wall with bilateral  Distant bs s audible wheeze and  without cough on insp or exp maneuvers and mod  Hyperresonant  to  percussion bilaterally     CV:  RRR  no s3 or murmur or increase in P2, and no edema   ABD:  soft and nontender with pos mid insp Hoover's  in the supine position. No bruits or organomegaly appreciated, bowel sounds nl  MS:   Ext warm without deformities or   obvious joint restrictions , calf tenderness, cyanosis or clubbing  SKIN: warm and dry without lesions    NEURO:  alert, approp, nl sensorium with  no motor or cerebellar deficits apparent.          I personally reviewed images and agree with radiology impression as follows:   Chest LDSCT     Apr 19 2022  1. Lung-RADS 2, benign appearance or behavior. Continue annual screening with low-dose chest CT without contrast in 12 months. 2. Coronary artery calcifications, aortic Atherosclerosis (ICD10-I70.0) and Mild Emphysema    Assessment

## 2022-06-01 NOTE — Patient Instructions (Signed)
Work on inhaler technique:  relax and gently blow all the way out then take a nice smooth full deep breath back in, triggering the inhaler at same time you start breathing in.  Hold breath in for at least  5 seconds if you can. Blow out breztri thru nose. Rinse and gargle with water when done.  If mouth or throat bother you at all,  try brushing teeth/gums/tongue with arm and hammer toothpaste/ make a slurry and gargle and spit out.   Also  Ok to try albuterol 15 min before an activity (on alternating days)  that you know would usually make you short of breath and see if it makes any difference and if makes none then don't take albuterol after activity unless you can't catch your breath as this means it's the resting that helps, not the albuterol.      Please schedule a follow up visit in 6 months but call sooner if needed

## 2022-06-13 ENCOUNTER — Ambulatory Visit: Payer: BC Managed Care – PPO | Attending: Cardiology

## 2022-06-13 DIAGNOSIS — R072 Precordial pain: Secondary | ICD-10-CM | POA: Diagnosis not present

## 2022-06-13 DIAGNOSIS — I2584 Coronary atherosclerosis due to calcified coronary lesion: Secondary | ICD-10-CM | POA: Diagnosis not present

## 2022-06-13 DIAGNOSIS — I2510 Atherosclerotic heart disease of native coronary artery without angina pectoris: Secondary | ICD-10-CM

## 2022-06-13 DIAGNOSIS — R079 Chest pain, unspecified: Secondary | ICD-10-CM | POA: Diagnosis not present

## 2022-06-13 LAB — ECHOCARDIOGRAM COMPLETE
AR max vel: 4.58 cm2
AV Area VTI: 4.42 cm2
AV Area mean vel: 4.41 cm2
AV Mean grad: 2 mmHg
AV Peak grad: 4.2 mmHg
Ao pk vel: 1.03 m/s
Area-P 1/2: 3.68 cm2
Calc EF: 47.3 %
S' Lateral: 3.6 cm
Single Plane A2C EF: 51.1 %
Single Plane A4C EF: 50.9 %

## 2022-06-27 NOTE — Progress Notes (Unsigned)
Established Office Visit  Subjective:     Patient ID: Zachary Wallace, male    DOB: 08-01-1955, 67 y.o.   MRN: [id number]  No chief complaint on file.   HPI Patient is in today for follow up.  Diabetes, Type 2: -Last A1c 7.1%, 08/22/21 -Medications: Tresiba 28 units, Trulicity [id number] mg weekly on Sundays  -Patient is compliant with the above medications and reports no side effects.  -Eye exam: UTD 10/23 -Foot exam: Following with Endocrinologist and Podiatry, foot exam 7/23 -Microalbumin: UTD 5/23 -Statin: yes -PNA vaccine: UTD -Denies symptoms of hypoglycemia, polyuria, polydipsia, numbness extremities, foot ulcers/trauma.  -Follows with Dr. Honor Junes Endocrinology at [facility], note reviewed from 10/23/21  HLD: -Medications: Lipitor 40 mg  -Patient is compliant with above medications and reports no side effects.  -Last lipid panel: 7/23 TC 97, triglycerides 181, HDL 39, LDL 22  Hypertension: -Medications: Amlodipine 10 mg, Losartan-HCTZ 100-12.5, Bisoprolol 5 mg BID  -Patient is compliant with above medications and reports no side effects. -Checking BP at home (average): No -Denies any SOB, CP, vision changes, LE edema or symptoms of hypotension  COPD: -COPD status: stable -Current medications: Breztri BID, albuterol twice daily, was given a steroid taper when he was at his pulmonologist office in October  -Satisfied with current treatment?: yes -Oxygen use: no -Dyspnea frequency: Occasionally  -Cough frequency: Improving - still daily cough with stable sputum production -Rescue inhaler frequency: twice daily -Limitation of activity: yes -Productive cough: Yes, white thick but stable -Pneumovax: Up to Date -Influenza: Up to Date -Lung cancer screening LUNG-RADS-2 1/23, scheduled for 1/24 -Following with Pulmonology, last seen 03/13/22 -Working on quitting smoking, now down to 1 cigarettes occasionally   GERD:  -Currently on Protonix 40 mg   Health Maintenance: -Blood  work due -Colonoscopy 01/2018, repeat in 10 years -Lung cancer screening 1/24 - Lung-RADS2   Review of Systems  Constitutional:  Negative for chills and fever.  Respiratory:  Positive for cough. Negative for shortness of breath and wheezing.   Cardiovascular:  Negative for chest pain.  Gastrointestinal:  Negative for heartburn.        Objective:    There were no vitals taken for this visit. BP Readings from Last 3 Encounters:  06/01/22 110/80  05/24/22 132/76  04/20/22 130/84   Wt Readings from Last 3 Encounters:  06/01/22 185 lb 3.2 oz (84 kg)  05/24/22 188 lb (85.3 kg)  04/20/22 184 lb (83.5 kg)      Physical Exam Constitutional:      Appearance: Normal appearance.  HENT:     Head: Normocephalic and atraumatic.  Eyes:     Conjunctiva/sclera: Conjunctivae normal.  Cardiovascular:     Rate and Rhythm: Normal rate and regular rhythm.  Pulmonary:     Effort: Pulmonary effort is normal.     Breath sounds: Normal breath sounds.     Comments: Decreased air movement throughout but  no wheezes, rhonchi Skin:    General: Skin is warm and dry.  Neurological:     General: No focal deficit present.     Mental Status: He is alert. Mental status is at baseline.  Psychiatric:        Mood and Affect: Mood normal.        Behavior: Behavior normal.     No results found for any visits on 06/28/22.      Assessment & Plan:   1. COPD mixed type [facility]): Patient states overall his symptoms are stable, however when he  first walked into the office today his oxygen saturation was at 80%.  After few minutes of rest it came up to 90%.  The patient is not on oxygen therapy.  Recommend he obtain a pulse oximeter and monitor his oxygen levels at home, if it is consistently below 80 he will let me or his pulmonologist know for potential walk test/supplemental oxygen.  Continue Breztri 2 puffs daily, albuterol as needed.  Pulmonology note from 03/13/2022 reviewed.  Follow-up here in 3 months  for recheck.  2. Essential hypertension: Chronic, blood pressure at goal today.  Continue losartan-HCTZ 100-12.5 mg daily and amlodipine 10 mg daily, refilled today.  - amLODipine (NORVASC) 10 MG tablet; Take 1 tablet (10 mg total) by mouth daily.  Dispense: 90 tablet; Refill: 3 - losartan-hydrochlorothiazide (HYZAAR) 100-12.5 MG tablet; Take 1 tablet by mouth daily.  Dispense: 90 tablet; Refill: 1  3. Mixed hyperlipidemia: Stable, reviewed labs from July 2023.  Continue Lipitor 40 mg daily, refilled.  - atorvastatin (LIPITOR) 40 MG tablet; Take 1 tablet (40 mg total) by mouth daily.  Dispense: 90 tablet; Refill: 3  4. Gastroesophageal reflux disease, unspecified whether esophagitis present: Stable, refill Protonix 40 mg daily.  - pantoprazole (PROTONIX) 40 MG tablet; Take 1 tablet (40 mg total) by mouth daily. Take 30-60 min before first meal of the day  Dispense: 30 tablet; Refill: 2  5. Erectile dysfunction, unspecified erectile dysfunction type: Viagra to use as needed refilled.  - sildenafil (VIAGRA) 100 MG tablet; Take 1 tablet (100 mg total) by mouth as needed for erectile dysfunction.  Dispense: 30 tablet; Refill: 5   No follow-ups on file.  Teodora Medici, DO

## 2022-06-28 ENCOUNTER — Encounter: Payer: Self-pay | Admitting: Internal Medicine

## 2022-06-28 ENCOUNTER — Ambulatory Visit (INDEPENDENT_AMBULATORY_CARE_PROVIDER_SITE_OTHER): Payer: BC Managed Care – PPO | Admitting: Internal Medicine

## 2022-06-28 VITALS — BP 122/76 | HR 83 | Temp 97.5°F | Resp 16 | Ht 68.0 in | Wt 187.4 lb

## 2022-06-28 DIAGNOSIS — E1122 Type 2 diabetes mellitus with diabetic chronic kidney disease: Secondary | ICD-10-CM | POA: Diagnosis not present

## 2022-06-28 DIAGNOSIS — E782 Mixed hyperlipidemia: Secondary | ICD-10-CM | POA: Diagnosis not present

## 2022-06-28 DIAGNOSIS — J42 Unspecified chronic bronchitis: Secondary | ICD-10-CM

## 2022-06-28 DIAGNOSIS — Z794 Long term (current) use of insulin: Secondary | ICD-10-CM

## 2022-06-28 DIAGNOSIS — I10 Essential (primary) hypertension: Secondary | ICD-10-CM | POA: Diagnosis not present

## 2022-06-28 DIAGNOSIS — Z114 Encounter for screening for human immunodeficiency virus [HIV]: Secondary | ICD-10-CM

## 2022-06-28 DIAGNOSIS — N1832 Chronic kidney disease, stage 3b: Secondary | ICD-10-CM

## 2022-06-28 DIAGNOSIS — R972 Elevated prostate specific antigen [PSA]: Secondary | ICD-10-CM

## 2022-06-28 DIAGNOSIS — Z125 Encounter for screening for malignant neoplasm of prostate: Secondary | ICD-10-CM

## 2022-06-28 DIAGNOSIS — R109 Unspecified abdominal pain: Secondary | ICD-10-CM

## 2022-06-28 MED ORDER — DICYCLOMINE HCL 10 MG PO CAPS
10.0000 mg | ORAL_CAPSULE | Freq: Three times a day (TID) | ORAL | 1 refills | Status: DC | PRN
Start: 2022-06-28 — End: 2022-08-07

## 2022-06-29 LAB — COMPLETE METABOLIC PANEL WITH GFR
AG Ratio: 1.7 (calc) (ref 1.0–2.5)
ALT: 15 U/L (ref 9–46)
AST: 23 U/L (ref 10–35)
Albumin: 4.3 g/dL (ref 3.6–5.1)
Alkaline phosphatase (APISO): 113 U/L (ref 35–144)
BUN/Creatinine Ratio: 4 (calc) — ABNORMAL LOW (ref 6–22)
BUN: 11 mg/dL (ref 7–25)
CO2: 30 mmol/L (ref 20–32)
Calcium: 9.7 mg/dL (ref 8.6–10.3)
Chloride: 100 mmol/L (ref 98–110)
Creat: 2.45 mg/dL — ABNORMAL HIGH (ref 0.70–1.35)
Globulin: 2.5 g/dL (calc) (ref 1.9–3.7)
Glucose, Bld: 149 mg/dL — ABNORMAL HIGH (ref 65–99)
Potassium: 3.9 mmol/L (ref 3.5–5.3)
Sodium: 138 mmol/L (ref 135–146)
Total Bilirubin: 0.4 mg/dL (ref 0.2–1.2)
Total Protein: 6.8 g/dL (ref 6.1–8.1)
eGFR: 29 mL/min/{1.73_m2} — ABNORMAL LOW (ref >=60)

## 2022-06-29 LAB — HEMOGLOBIN A1C
Hgb A1c MFr Bld: 9.4 % of total Hgb — ABNORMAL HIGH (ref <5.7)
Mean Plasma Glucose: 223 mg/dL
eAG (mmol/L): 12.4 mmol/L

## 2022-06-29 LAB — PSA: PSA: 4.69 ng/mL — ABNORMAL HIGH (ref <=4.00)

## 2022-06-29 LAB — HIV ANTIBODY (ROUTINE TESTING W REFLEX): HIV 1&2 Ab, 4th Generation: NONREACTIVE

## 2022-06-29 NOTE — Addendum Note (Signed)
Addended by: Margarita Mail on: 06/29/2022 12:56 PM   Modules accepted: Orders

## 2022-07-02 ENCOUNTER — Encounter: Payer: Self-pay | Admitting: Cardiology

## 2022-07-02 ENCOUNTER — Other Ambulatory Visit
Admission: RE | Admit: 2022-07-02 | Discharge: 2022-07-02 | Disposition: A | Discharge status: Home / Self Care | Payer: BC Managed Care – PPO | Source: Ambulatory Visit | Attending: Cardiology | Admitting: Cardiology

## 2022-07-02 ENCOUNTER — Ambulatory Visit: Payer: BC Managed Care – PPO | Attending: Cardiology | Admitting: Cardiology

## 2022-07-02 VITALS — BP 112/78 | HR 66 | Ht 66.0 in | Wt 184.4 lb

## 2022-07-02 DIAGNOSIS — I2510 Atherosclerotic heart disease of native coronary artery without angina pectoris: Secondary | ICD-10-CM

## 2022-07-02 DIAGNOSIS — I10 Essential (primary) hypertension: Secondary | ICD-10-CM | POA: Diagnosis not present

## 2022-07-02 DIAGNOSIS — E782 Mixed hyperlipidemia: Secondary | ICD-10-CM | POA: Insufficient documentation

## 2022-07-02 DIAGNOSIS — I2584 Coronary atherosclerosis due to calcified coronary lesion: Secondary | ICD-10-CM | POA: Diagnosis not present

## 2022-07-02 LAB — LIPID PANEL
Cholesterol: 106 mg/dL (ref 0–200)
HDL: 41 mg/dL (ref >40)
LDL Cholesterol: 43 mg/dL (ref 0–99)
Total CHOL/HDL Ratio: 2.6 RATIO
Triglycerides: 111 mg/dL (ref <150)
VLDL: 22 mg/dL (ref 0–40)

## 2022-07-02 NOTE — Progress Notes (Signed)
Cardiology Office Note:    Date:  07/02/2022   ID:  Zachary Wallace, DOB 1955/07/11, MRN [id number]  PCP:  Margarita MailAndrews, Elisabeth, DO   [facility] Providers Cardiologist:  Debbe OdeaBrian Agbor-Etang, MD     Referring MD: Margarita MailAndrews, Elisabeth, DO   Chief Complaint  Patient presents with   Follow-up    Here to discuss results of echo and stress test.  Increasing cramping in hands and legs.      History of Present Illness:    Zachary DeputyLarry K Wallace is a 67 y.o. male with a hx of CAD (RCA calcifications on chest CT ), hypertension, hyperlipidemia, CKD, diabetes, former smoker x 40 years, COPD presenting for follow-up.  Previously seen due to symptoms of chest pain, RCA calcifications on chest CT.  Echo and Lexiscan Myoview was obtained to evaluate ischemia.  Overall he feels well, denies chest pain, compliant with medications as prescribed.  Prior notes/studies Echo 05/2022 EF 55 to 60% Lexiscan Myoview 04/2022 no ischemia Chest CT lung cancer screening 04/19/2022 showed coronary calcifications.  Past Medical History:  Diagnosis Date   COPD (chronic obstructive pulmonary disease)    Diabetes mellitus without complication    Hyperlipidemia    Hypertension    OSA (obstructive sleep apnea)     Past Surgical History:  Procedure Laterality Date   COLONOSCOPY WITH PROPOFOL N/A 02/04/2018   Procedure: COLONOSCOPY WITH PROPOFOL;  Surgeon: Toney ReilVanga, Rohini Reddy, MD;  Location: [facility] ENDOSCOPY;  Service: Gastroenterology;  Laterality: N/A;   HAND SURGERY Left    KNEE SURGERY Left     Current Medications: Current Meds  Medication Sig   albuterol (PROVENTIL) (2.5 MG/3ML) 0.083% nebulizer solution Take 3 mLs (2.5 mg total) by nebulization every 6 (six) hours as needed for wheezing or shortness of breath.   albuterol (VENTOLIN HFA) 108 (90 Base) MCG/ACT inhaler Inhale 1-2 puffs into the lungs every 6 (six) hours as needed for wheezing or shortness of breath.   amLODipine (NORVASC) 10 MG tablet Take 1 tablet  (10 mg total) by mouth daily.   aspirin 81 MG tablet Take 81 mg by mouth.   atorvastatin (LIPITOR) 40 MG tablet Take 1 tablet (40 mg total) by mouth daily.   B-D UF III MINI PEN NEEDLES 31G X 5 MM MISC    benzonatate (TESSALON) 100 MG capsule Take 1 capsule (100 mg total) by mouth 2 (two) times daily as needed for cough.   bisoprolol (ZEBETA) 5 MG tablet One twice daily   BREZTRI AEROSPHERE 160-9-4.8 MCG/ACT AERO INHALE 2 PUFFS BY MOUTH TWICE DAILY   dicyclomine (BENTYL) 10 MG capsule Take 1 capsule (10 mg total) by mouth 3 (three) times daily as needed for spasms (abdominal cramping).   Dulaglutide 0.75 MG/0.5ML SOPN Inject 0.5 mg into the skin once a week.   famotidine (PEPCID) 20 MG tablet One after supper   glucose blood (ONETOUCH VERIO) test strip    guaiFENesin (MUCINEX) 600 MG 12 hr tablet Take 1 tablet (600 mg total) by mouth daily.   insulin degludec (TRESIBA) 100 UNIT/ML FlexTouch Pen Inject into the skin.   losartan-hydrochlorothiazide (HYZAAR) 100-12.5 MG tablet Take 1 tablet by mouth daily.   nicotine (NICODERM CQ - DOSED IN MG/24 HR) 7 mg/24hr patch Place 1 patch (7 mg total) onto the skin daily.   pantoprazole (PROTONIX) 40 MG tablet Take 1 tablet (40 mg total) by mouth daily. Take 30-60 min before first meal of the day   Respiratory Therapy Supplies (NEBULIZER/TUBING/MOUTHPIECE) KIT Disp one  nebulizer machine, tubing set and mouthpiece kit   sildenafil (VIAGRA) 100 MG tablet Take 1 tablet (100 mg total) by mouth as needed for erectile dysfunction.     Allergies:   Patient has no known allergies.   Social History   Socioeconomic History   Marital status: Married    Spouse name: Not on file   Number of children: Not on file   Years of education: Not on file   Highest education level: Not on file  Occupational History   Occupation: retires custodian [employer]  Tobacco Use   Smoking status: Former    Packs/day: 1.00    Years: 41.00    Additional pack  years: 0.00    Total pack years: 41.00    Types: Cigarettes    Quit date: 03/13/2022    Years since quitting: 0.3   Smokeless tobacco: Never   Tobacco comments:    Smoked last on 12/11.  Trying to quit.   Using patches.  HFB  03/13/2022  Vaping Use   Vaping Use: Never used  Substance and Sexual Activity   Alcohol use: No    Alcohol/week: 0.0 standard drinks of alcohol   Drug use: No   Sexual activity: Yes    Partners: Male, Male  Other Topics Concern   Not on file  Social History Narrative   Not on file   Social Determinants of Health   Financial Resource Strain: Low Risk  (09/01/2020)   Overall Financial Resource Strain (CARDIA)    Difficulty of Paying Living Expenses: Not hard at all  Food Insecurity: No Food Insecurity (09/01/2020)   Hunger Vital Sign    Worried About Running Out of Food in the Last Year: Never true    Ran Out of Food in the Last Year: Never true  Transportation Needs: No Transportation Needs (09/01/2020)   PRAPARE - Administrator, Civil Service (Medical): No    Lack of Transportation (Non-Medical): No  Physical Activity: Insufficiently Active (09/01/2020)   Exercise Vital Sign    Days of Exercise per Week: 3 days    Minutes of Exercise per Session: 30 min  Stress: No Stress Concern Present (09/01/2020)   [employer] of Occupational Health - Occupational Stress Questionnaire    Feeling of Stress : Only a little  Social Connections: Socially Integrated (09/01/2020)   Social Connection and Isolation Panel [NHANES]    Frequency of Communication with Friends and Family: More than three times a week    Frequency of Social Gatherings with Friends and Family: More than three times a week    Attends Religious Services: More than 4 times per year    Active Member of [employer] or Organizations: Yes    Attends Engineer, structural: More than 4 times per year    Marital Status: Married     Family History: The patient's family history includes  Clotting disorder in his mother; Diabetes in his mother; Heart attack in his father; Hypertension in his brother, mother, and sister; Vision loss in his brother.  ROS:   Please see the history of present illness.     All other systems reviewed and are negative.  EKGs/Labs/Other Studies Reviewed:    The following studies were reviewed today:  EKG:  EKG not ordered today.   Recent Labs: 06/28/2022: ALT 15; BUN 11; Creat 2.45; Potassium 3.9; Sodium 138  Recent Lipid Panel    Component Value Date/Time   CHOL 152 09/01/2020 1015   TRIG  305 (H) 09/01/2020 1015   HDL 32 (L) 09/01/2020 1015   CHOLHDL 4.8 09/01/2020 1015   VLDL 27 10/05/2016 1524   LDLCALC 83 09/01/2020 1015   Recent outside lipid panel 09/2021 total cholesterol 97, triglyceride 181, LDL 22  Risk Assessment/Calculations:             Physical Exam:    VS:  BP 112/78 (BP Location: Left Arm, Patient Position: Sitting)   Pulse 66   Ht 5\' 6"  (1.676 m)   Wt 184 lb 6.4 oz (83.6 kg)   SpO2 90%   BMI 29.76 kg/m     Wt Readings from Last 3 Encounters:  07/02/22 184 lb 6.4 oz (83.6 kg)  06/28/22 187 lb 6.4 oz (85 kg)  06/01/22 185 lb 3.2 oz (84 kg)     GEN: Well nourished, well developed in no acute distress HEENT: Normal NECK: No JVD; No carotid bruits CARDIAC: RRR, no murmurs, rubs, gallops RESPIRATORY: Mild left lung expiratory wheezing ABDOMEN: Soft, non-tender, non-distended MUSCULOSKELETAL:  No edema; No deformity  SKIN: Warm and dry NEUROLOGIC:  Alert and oriented x 3 PSYCHIATRIC:  Normal affect   ASSESSMENT:    1. Coronary artery calcification   2. Primary hypertension   3. Mixed hyperlipidemia    PLAN:    In order of problems listed above:  CAD/coronary calcifications on chest CT. EF 55 to 60%, Myoview with no ischemia.  Continue aspirin, Lipitor 40 mg daily. Hypertension, BP controlled.  Continue Norvasc, Hyzaar, bisoprolol. Hyperlipidemia, continue Lipitor 40.  Obtain fasting lipid  profile.  Follow-up in 1 year.     Medication Adjustments/Labs and Tests Ordered: Current medicines are reviewed at length with the patient today.  Concerns regarding medicines are outlined above.  Orders Placed This Encounter  Procedures   Lipid panel   No orders of the defined types were placed in this encounter.   Patient Instructions  Medication Instructions:   Your physician recommends that you continue on your current medications as directed. Please refer to the Current Medication list given to you today.  *If you need a refill on your cardiac medications before your next appointment, please call your pharmacy*   Lab Work:  Your physician recommends you go to the medical mall for labs.   If you have labs (blood work) drawn today and your tests are completely normal, you will receive your results only by: MyChart Message (if you have MyChart) OR A paper copy in the mail If you have any lab test that is abnormal or we need to change your treatment, we will call you to review the results.   Testing/Procedures:  None Ordered   Follow-Up: At [facility], you and your health needs are our priority.  As part of our continuing mission to provide you with exceptional heart care, we have created designated Provider Care Teams.  These Care Teams include your primary Cardiologist (physician) and Advanced Practice Providers (APPs -  Physician Assistants and Nurse Practitioners) who all work together to provide you with the care you need, when you need it.  We recommend signing up for the patient portal called "MyChart".  Sign up information is provided on this After Visit Summary.  MyChart is used to connect with patients for Virtual Visits (Telemedicine).  Patients are able to view lab/test results, encounter notes, upcoming appointments, etc.  Non-urgent messages can be sent to your provider as well.   To learn more about what you can do with MyChart, go to  [email or web address].    Your next appointment:   12 Months  Provider:   You may see Debbe Odea, MD or one of the following Advanced Practice Providers on your designated Care Team:   Nicolasa Ducking, NP Eula Listen, PA-C Cadence Fransico Michael, PA-C Charlsie Quest, NP    Signed, Debbe Odea, MD  07/02/2022 12:15 PM     [facility]

## 2022-07-02 NOTE — Patient Instructions (Addendum)
Medication Instructions:   Your physician recommends that you continue on your current medications as directed. Please refer to the Current Medication list given to you today.  *If you need a refill on your cardiac medications before your next appointment, please call your pharmacy*   Lab Work:  Your physician recommends you go to the medical mall for labs.   If you have labs (blood work) drawn today and your tests are completely normal, you will receive your results only by: MyChart Message (if you have MyChart) OR A paper copy in the mail If you have any lab test that is abnormal or we need to change your treatment, we will call you to review the results.   Testing/Procedures:  None Ordered   Follow-Up: At [facility], you and your health needs are our priority.  As part of our continuing mission to provide you with exceptional heart care, we have created designated Provider Care Teams.  These Care Teams include your primary Cardiologist (physician) and Advanced Practice Providers (APPs -  Physician Assistants and Nurse Practitioners) who all work together to provide you with the care you need, when you need it.  We recommend signing up for the patient portal called "MyChart".  Sign up information is provided on this After Visit Summary.  MyChart is used to connect with patients for Virtual Visits (Telemedicine).  Patients are able to view lab/test results, encounter notes, upcoming appointments, etc.  Non-urgent messages can be sent to your provider as well.   To learn more about what you can do with MyChart, go to [email or web address].    Your next appointment:   12 Months  Provider:   You may see Debbe Odea, MD or one of the following Advanced Practice Providers on your designated Care Team:   Nicolasa Ducking, NP Eula Listen, PA-C Cadence Fransico Michael, PA-C Charlsie Quest, NP

## 2022-07-23 LAB — HM DIABETES FOOT EXAM: HM Diabetic Foot Exam: NORMAL

## 2022-07-31 ENCOUNTER — Other Ambulatory Visit
Admission: RE | Admit: 2022-07-31 | Discharge: 2022-07-31 | Disposition: A | Discharge status: Home / Self Care | Payer: BC Managed Care – PPO | Attending: Urology | Admitting: Urology

## 2022-07-31 ENCOUNTER — Ambulatory Visit: Payer: BC Managed Care – PPO | Admitting: Urology

## 2022-07-31 ENCOUNTER — Encounter: Payer: Self-pay | Admitting: Urology

## 2022-07-31 VITALS — BP 120/80 | HR 66 | Ht 66.0 in | Wt 185.0 lb

## 2022-07-31 DIAGNOSIS — E1122 Type 2 diabetes mellitus with diabetic chronic kidney disease: Secondary | ICD-10-CM

## 2022-07-31 DIAGNOSIS — N529 Male erectile dysfunction, unspecified: Secondary | ICD-10-CM | POA: Diagnosis not present

## 2022-07-31 DIAGNOSIS — N184 Chronic kidney disease, stage 4 (severe): Secondary | ICD-10-CM | POA: Diagnosis not present

## 2022-07-31 DIAGNOSIS — R972 Elevated prostate specific antigen [PSA]: Secondary | ICD-10-CM

## 2022-07-31 NOTE — Patient Instructions (Signed)

## 2022-07-31 NOTE — Addendum Note (Signed)
Addended by: Debbe Bales C on: 07/31/2022 11:03 AM   Modules accepted: Orders

## 2022-07-31 NOTE — Progress Notes (Signed)
   07/31/22 10:47 AM   [name] [birth year] [id number]  CC: Elevated PSA, ED, CKD  HPI: 67 year old male with a number of comorbidities including diabetes, CKD stage IV, ED who presents with a mildly elevated PSA of 4.69.  This increased from his baseline of 1.2 from 2020.  He has never had a prior prostate biopsy, no prior cross-sectional imaging to review.  Denies any significant urinary symptoms.  He has ED that is responsive to 100 mg sildenafil on demand he gets from PCP.  PMH: Past Medical History:  Diagnosis Date   COPD (chronic obstructive pulmonary disease) (HCC)    Diabetes mellitus without complication (HCC)    Hyperlipidemia    Hypertension    OSA (obstructive sleep apnea)     Surgical History: Past Surgical History:  Procedure Laterality Date   COLONOSCOPY WITH PROPOFOL N/A 02/04/2018   Procedure: COLONOSCOPY WITH PROPOFOL;  Surgeon: Toney Reil, MD;  Location: [facility] ENDOSCOPY;  Service: Gastroenterology;  Laterality: N/A;   HAND SURGERY Left    KNEE SURGERY Left    Family History: Family History  Problem Relation Age of Onset   Clotting disorder Mother    Diabetes Mother    Hypertension Mother    Heart attack Father    Hypertension Sister    Hypertension Brother    Vision loss Brother     Social History:  reports that he quit smoking about 4 months ago. His smoking use included cigarettes. He has a 41.00 pack-year smoking history. He has been exposed to tobacco smoke. He has never used smokeless tobacco. He reports that he does not drink alcohol and does not use drugs.  Physical Exam: BP 120/80   Pulse 66   Ht 5\' 6"  (1.676 m)   Wt 185 lb (83.9 kg)   BMI 29.86 kg/m    Constitutional:  Alert and oriented, No acute distress. Cardiovascular: No clubbing, cyanosis, or edema. Respiratory: Normal respiratory effort, no increased work of breathing. GI: Abdomen is soft, nontender, nondistended, no abdominal masses   Assessment & Plan:    Comorbid 67 year old male with diabetes and CKD stage IV, ED well-controlled on sildenafil who presents with a mildly elevated PSA of 4.69, increased from baseline of 1.2 from 2020.  We reviewed the implications of an elevated PSA and the uncertainty surrounding it. In general, a man's PSA increases with age and is produced by both normal and cancerous prostate tissue. The differential diagnosis for elevated PSA includes BPH, prostate cancer, infection, recent intercourse/ejaculation, recent urethroscopic manipulation (foley placement/cystoscopy) or trauma, and prostatitis.   Management of an elevated PSA can include observation or prostate biopsy and we discussed this in detail. Our goal is to detect clinically significant prostate cancers, and manage with either active surveillance, surgery, or radiation for localized disease. Risks of prostate biopsy include bleeding, infection (including life threatening sepsis), pain, and lower urinary symptoms. Hematuria, hematospermia, and blood in the stool are all common after biopsy and can persist up to 4 weeks.   Repeat PSA today with reflex to free, if return to normal range can continue screening with PCP every other year, if remains elevated consider prostate MRI Legrand Rams, MD 07/31/2022  [facility] [address], [address] [place], [place] [address] [phone]

## 2022-08-02 ENCOUNTER — Telehealth: Payer: Self-pay

## 2022-08-02 LAB — PSA, TOTAL AND FREE
PSA, Free Pct: 21.7 %
PSA, Free: 0.76 ng/mL
Prostate Specific Ag, Serum: 3.5 ng/mL (ref 0.0–4.0)

## 2022-08-02 NOTE — Telephone Encounter (Signed)
Called pt informed him of the information below. Pt voiced understanding.  

## 2022-08-02 NOTE — Telephone Encounter (Signed)
-----   Message from Sondra Come, MD sent at 08/02/2022  7:46 AM EDT ----- Randie Heinz news, PSA normal, recommend continuing screening every other year with PCP  Legrand Rams, MD 08/02/2022

## 2022-08-06 ENCOUNTER — Other Ambulatory Visit: Payer: Self-pay | Admitting: Internal Medicine

## 2022-08-06 DIAGNOSIS — R109 Unspecified abdominal pain: Secondary | ICD-10-CM

## 2022-08-07 NOTE — Telephone Encounter (Signed)
Requested Prescriptions  Pending Prescriptions Disp Refills   dicyclomine (BENTYL) 10 MG capsule [Pharmacy Med Name: dicyclomine 10 mg capsule] 270 capsule 0    Sig: TAKE ONE CAPSULE BY MOUTH THREE TIMES DAILY AS NEEDED FOR MUSCLE SPASMS     Gastroenterology:  Antispasmodic Agents Passed - 08/06/2022 10:28 AM      Passed - Valid encounter within last 12 months    Recent Outpatient Visits           1 month ago Type 2 diabetes mellitus with stage 3b chronic kidney disease, with long-term current use of insulin [facility])   [place] [facility] Margarita Mail, DO   2 months ago COPD with acute exacerbation [facility])   [facility] Margarita Mail, DO   4 months ago COPD mixed type [facility])   [facility] Margarita Mail, DO   7 months ago Tobacco dependence   [facility] Margarita Mail, DO   10 months ago COPD GOLD 3   / still smoking    [facility] Margarita Mail, DO       Future Appointments             In 1 month Margarita Mail, DO [facility], [facility]

## 2022-08-21 ENCOUNTER — Other Ambulatory Visit: Payer: Self-pay | Admitting: Internal Medicine

## 2022-08-21 DIAGNOSIS — J42 Unspecified chronic bronchitis: Secondary | ICD-10-CM

## 2022-08-22 NOTE — Telephone Encounter (Signed)
Requested Prescriptions  Pending Prescriptions Disp Refills   albuterol (VENTOLIN HFA) 108 (90 Base) MCG/ACT inhaler [Pharmacy Med Name: albuterol sulfate HFA 90 mcg/actuation aerosol inhaler] 18 g 0    Sig: INHALE 1-2 PUFFS EVERY 6 HOURS AS NEEDED WHEEZING OR SHORTNESS OF BREATH     Pulmonology:  Beta Agonists 2 Passed - 08/21/2022  8:02 AM      Passed - Last BP in normal range    BP Readings from Last 1 Encounters:  07/31/22 120/80         Passed - Last Heart Rate in normal range    Pulse Readings from Last 1 Encounters:  07/31/22 66         Passed - Valid encounter within last 12 months    Recent Outpatient Visits           1 month ago Type 2 diabetes mellitus with stage 3b chronic kidney disease, with long-term current use of insulin [facility])   [place] [facility] Margarita Mail, DO   3 months ago COPD with acute exacerbation [facility])   [facility] Margarita Mail, DO   4 months ago COPD mixed type [facility])   [facility] Margarita Mail, DO   7 months ago Tobacco dependence   [facility] Margarita Mail, DO   11 months ago COPD GOLD 3   / still smoking    [facility] Margarita Mail, DO       Future Appointments             In 1 month Margarita Mail, DO [facility], [facility]

## 2022-08-29 LAB — PROTEIN / CREATININE RATIO, URINE: Creatinine, Urine: 148.6

## 2022-09-13 ENCOUNTER — Other Ambulatory Visit: Payer: Self-pay | Admitting: Internal Medicine

## 2022-09-13 DIAGNOSIS — J449 Chronic obstructive pulmonary disease, unspecified: Secondary | ICD-10-CM

## 2022-09-13 NOTE — Telephone Encounter (Signed)
Requested Prescriptions  Pending Prescriptions Disp Refills   albuterol (PROVENTIL) (2.5 MG/3ML) 0.083% nebulizer solution [Pharmacy Med Name: albuterol sulfate 2.5 mg/3 mL (0.083 %) solution for nebulization] 180 mL 3    Sig: USE ONE ampule via NEBULIZER EVERY 6 HOURS AS NEEDED SHORTNESS OF BREATH OR wheezing     Pulmonology:  Beta Agonists 2 Passed - 09/13/2022  8:02 AM      Passed - Last BP in normal range    BP Readings from Last 1 Encounters:  07/31/22 120/80         Passed - Last Heart Rate in normal range    Pulse Readings from Last 1 Encounters:  07/31/22 66         Passed - Valid encounter within last 12 months    Recent Outpatient Visits           2 months ago Type 2 diabetes mellitus with stage 3b chronic kidney disease, with long-term current use of insulin [facility])   [place] [facility] Margarita Mail, DO   3 months ago COPD with acute exacerbation [facility])   [facility] Margarita Mail, DO   5 months ago COPD mixed type [facility])   [facility] Margarita Mail, DO   8 months ago Tobacco dependence   [facility] Margarita Mail, DO   11 months ago COPD GOLD 3   / still smoking    [facility] Margarita Mail, DO       Future Appointments             In 2 weeks Margarita Mail, DO [facility], [facility]

## 2022-09-23 ENCOUNTER — Other Ambulatory Visit: Payer: Self-pay | Admitting: Internal Medicine

## 2022-09-23 DIAGNOSIS — K219 Gastro-esophageal reflux disease without esophagitis: Secondary | ICD-10-CM

## 2022-09-25 NOTE — Telephone Encounter (Signed)
Requested Prescriptions  Pending Prescriptions Disp Refills   pantoprazole (PROTONIX) 40 MG tablet [Pharmacy Med Name: pantoprazole 40 mg tablet,delayed release] 30 tablet 2    Sig: TAKE ONE TABLET BY MOUTH ONCE DAILY. TAKE 30 - 60 MINUTES BEFORE FIRST MEAL OF THE DAY.     Gastroenterology: Proton Pump Inhibitors Passed - 09/23/2022  8:03 AM      Passed - Valid encounter within last 12 months    Recent Outpatient Visits           2 months ago Type 2 diabetes mellitus with stage 3b chronic kidney disease, with long-term current use of insulin [facility])   [place] [facility] Margarita Mail, DO   4 months ago COPD with acute exacerbation [facility])   [facility] Margarita Mail, DO   6 months ago COPD mixed type [facility])   [facility] Margarita Mail, DO   9 months ago Tobacco dependence   [place] [facility] Margarita Mail, DO   1 year ago COPD GOLD 3   / still smoking    [facility] Margarita Mail, DO       Future Appointments             In 3 days Margarita Mail, DO [facility], [facility]

## 2022-09-27 NOTE — Progress Notes (Deleted)
Established Office Visit  Subjective:     Patient ID: Zachary Wallace, male    DOB: 1956/03/09, 67 y.o.   MRN: [id number]  No chief complaint on file.   HPI Patient is in today for follow up. Patient today is having some lower abdominal pain. He states this has been going on for about 1 week and is located more in his lower quadrants. It is a cramping like sensation, worse at night. Does not bother him during the day at all. Eating does not affect it. He denies changes in stools, no fevers, nausea or vomiting. No changes to medications or diet. Severity 6/10.  Diabetes, Type 2: -Last A1c 9.4% 4/24 -Medications: Tresiba 28 units in the morning, Trulicity 0.75 mg weekly on Sundays  -Patient is compliant with the above medications and reports no side effects.  -Eye exam: UTD 10/23 -Foot exam: Following with Endocrinologist and Podiatry, foot exam 7/23 -Microalbumin: UTD 5/23 -Statin: yes -PNA vaccine: UTD -Denies symptoms of hypoglycemia, polyuria, polydipsia, numbness extremities, foot ulcers/trauma.  -Follows with Dr. Gershon Crane Endocrinology at [facility], note reviewed from 10/23/21  HLD: -Medications: Lipitor 40 mg  -Patient is compliant with above medications and reports no side effects.  -Last lipid panel: 7/23 TC 97, triglycerides 181, HDL 39, LDL 22  Hypertension: -Medications: Amlodipine 10 mg, Losartan-HCTZ 100-12.5, Bisoprolol 5 mg BID  -Patient is compliant with above medications and reports no side effects. -Checking BP at home (average): No -Denies any SOB, CP, vision changes, LE edema or symptoms of hypotension -Following with Cardiology, last seen 04/20/22 -Echo 06/13/22 55-60%  COPD: -COPD status: stable -Current medications: Breztri BID, albuterol twice daily -Satisfied with current treatment?: yes -Oxygen use: no -Dyspnea frequency: Occasionally  -Cough frequency: Occasionally but improved -Rescue inhaler frequency: twice daily -Limitation of activity:  yes -Productive cough: Yes, white thick but stable -Pneumovax: Up to Date -Influenza: Up to Date -Lung cancer screening LUNG-RADS-2 1/23, scheduled for 1/24 -Following with Pulmonology, last seen 06/01/22 -Working on quitting smoking, now down to 1 cigarettes occasionally   GERD:  -Currently on Protonix 40 mg   Health Maintenance: -Blood work due -Colonoscopy 01/2018, repeat in 10 years -Lung cancer screening 1/24 - Lung-RADS2   Review of Systems  Constitutional:  Negative for chills and fever.  Respiratory:  Negative for cough, shortness of breath and wheezing.   Cardiovascular:  Negative for chest pain.  Gastrointestinal:  Positive for abdominal pain. Negative for constipation, diarrhea, heartburn, nausea and vomiting.  Genitourinary:  Negative for dysuria, frequency and urgency.      Objective:    There were no vitals taken for this visit. BP Readings from Last 3 Encounters:  07/31/22 120/80  07/02/22 112/78  06/28/22 122/76   Wt Readings from Last 3 Encounters:  07/31/22 185 lb (83.9 kg)  07/02/22 184 lb 6.4 oz (83.6 kg)  06/28/22 187 lb 6.4 oz (85 kg)      Physical Exam Constitutional:      Appearance: Normal appearance.  HENT:     Head: Normocephalic and atraumatic.  Eyes:     Conjunctiva/sclera: Conjunctivae normal.  Cardiovascular:     Rate and Rhythm: Normal rate and regular rhythm.  Pulmonary:     Effort: Pulmonary effort is normal.     Breath sounds: Normal breath sounds.     Comments: Decreased air movement throughout but  no wheezes, rhonchi Abdominal:     General: Bowel sounds are normal. There is no distension.     Palpations: Abdomen is  soft.     Tenderness: There is abdominal tenderness. There is no right CVA tenderness, left CVA tenderness, guarding or rebound.     Comments: Tenderness to palpation in RLQ, mild in LLQ  Skin:    General: Skin is warm and dry.  Neurological:     General: No focal deficit present.     Mental Status: He is  alert. Mental status is at baseline.  Psychiatric:        Mood and Affect: Mood normal.        Behavior: Behavior normal.     No results found for any visits on 09/28/22.      Assessment & Plan:   1. Type 2 diabetes mellitus with stage 3b chronic kidney disease, with long-term current use of insulin: Obtain A1c and CMP today. Patient doing well on Tresiba 28 units in the morning and Trulicity 0.75 mg weekly. Trulicity could be contributing to abdominal pain but he has been stable on this dose for sometime. Does follow with Endocrinology, planning on seeing next month.   - COMPLETE METABOLIC PANEL WITH GFR - HgB W0J  2. Essential hypertension: Chronic and stable. Continue Amlodipine 10 mg, Losartan-HCTZ 100-12.5, Bisoprolol 5 mg BID.  3. Mixed hyperlipidemia: Stable, continue Lipitor 40 mg.  4. Chronic bronchitis, unspecified chronic bronchitis type: Doing better, following with Pulmonology, note reviewed from 06/01/22. Sample of Breztri given today.  5. Abdominal cramping: Mild bilateral lower quadrant pain on exam, symptoms worse at night but no peritoneal symptoms. Will treat as gas pains with bentyl as needed but if pain increases will obtain imaging.   - dicyclomine (BENTYL) 10 MG capsule; Take 1 capsule (10 mg total) by mouth 3 (three) times daily as needed for spasms (abdominal cramping).  Dispense: 30 capsule; Refill: 1  6. Prostate cancer screening/Encounter for screening for HIV: Screening labs ordered.  - PSA - HIV antibody (with reflex)   No follow-ups on file.  Margarita Mail, DO

## 2022-09-28 ENCOUNTER — Ambulatory Visit: Payer: BC Managed Care – PPO | Admitting: Internal Medicine

## 2022-11-01 NOTE — Progress Notes (Signed)
Established Office Visit  Subjective:     Patient ID: ELYAN MARCHAN, male    DOB: 1955-10-03, 67 y.o.   MRN: [id number]  Chief Complaint  Patient presents with   Follow-up    HPI Patient is in today for follow up. Patient feels like he is a little more short of breath today than normal but otherwise feels good.  Diabetes, Type 2: -Following with Endocrinology, last seen 07/23/22 -Last A1c 9.4 4/24 - offered to recheck A1c here, patient states it will be done at his next Endo appointment  -Medications: Tresiba 28 units in the morning, Trulicity 0.75 mg weekly on Sundays  -Patient is compliant with the above medications and reports no side effects.  -Eye exam: UTD 10/23 -Foot exam: Following with Podiatry  -Microalbumin: UTD 5/23 -Statin: yes -PNA vaccine: UTD -Denies symptoms of hypoglycemia, polyuria, polydipsia, numbness extremities, foot ulcers/trauma.   HLD: -Medications: Lipitor 40 mg  -Patient is compliant with above medications and reports no side effects.  -Last lipid panel: Lipid Panel     Component Value Date/Time   CHOL 106 07/02/2022 1153   TRIG 111 07/02/2022 1153   HDL 41 07/02/2022 1153   CHOLHDL 2.6 07/02/2022 1153   VLDL 22 07/02/2022 1153   LDLCALC 43 07/02/2022 1153   LDLCALC 83 09/01/2020 1015    Hypertension: -Medications: Amlodipine 10 mg, Losartan-HCTZ 100-12.5, Bisoprolol 5 mg BID  -Patient is compliant with above medications and reports no side effects. -Checking BP at home (average): No -Denies any SOB, CP, vision changes, LE edema or symptoms of hypotension -Following with Cardiology, last seen 04/20/22 -Echo 06/13/22 55-60%  COPD: -COPD status: stable -Current medications: Breztri BID, albuterol twice daily -Satisfied with current treatment?: yes -Oxygen use: no -Dyspnea frequency: Occasionally, slightly worse lately with the humidity  -Cough frequency: Occasional but no more than normal  -Rescue inhaler frequency: twice  daily -Limitation of activity: yes -Productive cough: Yes, white thick but stable -Pneumovax: Up to Date -Influenza: Up to Date -Lung cancer screening LUNG-RADS-2 1/23, scheduled for 1/24 -Following with Pulmonology, last seen 06/01/22 -Working on quitting smoking, now down to 1 cigarettes occasionally   GERD:  -Currently on Protonix 40 mg, symptoms well controlled  Health Maintenance: -Blood work UTD -Colonoscopy 01/2018, repeat in 10 years -Lung cancer screening 1/24 - Lung-RADS2   Review of Systems  Constitutional:  Negative for chills and fever.  Respiratory:  Positive for shortness of breath. Negative for cough and wheezing.   Cardiovascular:  Negative for chest pain.  Neurological:  Negative for dizziness and headaches.      Objective:    BP 122/72   Pulse 64   Temp 97.9 F (36.6 C)   Resp 18   Ht 5\' 6"  (1.676 m)   Wt 185 lb 11.2 oz (84.2 kg)   SpO2 93%   BMI 29.97 kg/m  BP Readings from Last 3 Encounters:  11/02/22 122/72  07/31/22 120/80  07/02/22 112/78   Wt Readings from Last 3 Encounters:  11/02/22 185 lb 11.2 oz (84.2 kg)  07/31/22 185 lb (83.9 kg)  07/02/22 184 lb 6.4 oz (83.6 kg)      Physical Exam Constitutional:      Appearance: Normal appearance.  HENT:     Head: Normocephalic and atraumatic.     Mouth/Throat:     Mouth: Mucous membranes are moist.     Pharynx: Oropharynx is clear.  Eyes:     Extraocular Movements: Extraocular movements intact.  Conjunctiva/sclera: Conjunctivae normal.     Pupils: Pupils are equal, round, and reactive to light.  Cardiovascular:     Rate and Rhythm: Normal rate and regular rhythm.  Pulmonary:     Effort: Pulmonary effort is normal.     Breath sounds: Normal breath sounds. No wheezing, rhonchi or rales.     Comments: Decreased air movement throughout but no wheezes, rhonchi Skin:    General: Skin is warm and dry.  Neurological:     General: No focal deficit present.     Mental Status: He is alert.  Mental status is at baseline.  Psychiatric:        Mood and Affect: Mood normal.        Behavior: Behavior normal.     Results for orders placed or performed in visit on 11/02/22  Protein / creatinine ratio, urine  Result Value Ref Range   Creatinine, Urine 148.6   Results for orders placed or performed in visit on 11/02/22  HM DIABETES FOOT EXAM  Result Value Ref Range   HM Diabetic Foot Exam normal   Results for orders placed or performed in visit on 11/02/22  Hemoglobin A1c  Result Value Ref Range   Hemoglobin A1C 7.1         Assessment & Plan:   1. Essential hypertension: Blood pressure well controlled, no changes made to medications but refills ordered.   - bisoprolol (ZEBETA) 5 MG tablet; One twice daily  Dispense: 60 tablet; Refill: 11 - losartan-hydrochlorothiazide (HYZAAR) 100-12.5 MG tablet; Take 1 tablet by mouth daily.  Dispense: 90 tablet; Refill: 1  2. Mixed hyperlipidemia: Stable, doing well on the Lipitor.   3. COPD mixed type [facility]): Lungs sound good today, can use an extra dose of Albuterol when feeling short of breath. Doing well on Breztri.   4. Gastroesophageal reflux disease, unspecified whether esophagitis present: Symptoms well controlled, will decrease dose to 20 mg to decrease risk of long term side effects.   - pantoprazole (PROTONIX) 20 MG tablet; Take 1 tablet (20 mg total) by mouth daily.  Dispense: 90 tablet; Refill: 1  5. Erectile dysfunction, unspecified erectile dysfunction type: Refills ordered.   - sildenafil (VIAGRA) 100 MG tablet; Take 1 tablet (100 mg total) by mouth as needed for erectile dysfunction.  Dispense: 30 tablet; Refill: 5   Return in 3 months (on 02/02/2023).  Margarita Mail, DO

## 2022-11-02 ENCOUNTER — Ambulatory Visit (INDEPENDENT_AMBULATORY_CARE_PROVIDER_SITE_OTHER): Payer: Medicare HMO | Admitting: Internal Medicine

## 2022-11-02 ENCOUNTER — Encounter: Payer: Self-pay | Admitting: Internal Medicine

## 2022-11-02 VITALS — BP 122/72 | HR 64 | Temp 97.9°F | Resp 18 | Ht 66.0 in | Wt 185.7 lb

## 2022-11-02 DIAGNOSIS — I10 Essential (primary) hypertension: Secondary | ICD-10-CM

## 2022-11-02 DIAGNOSIS — K219 Gastro-esophageal reflux disease without esophagitis: Secondary | ICD-10-CM | POA: Diagnosis not present

## 2022-11-02 DIAGNOSIS — N529 Male erectile dysfunction, unspecified: Secondary | ICD-10-CM

## 2022-11-02 DIAGNOSIS — E782 Mixed hyperlipidemia: Secondary | ICD-10-CM

## 2022-11-02 DIAGNOSIS — J449 Chronic obstructive pulmonary disease, unspecified: Secondary | ICD-10-CM

## 2022-11-02 MED ORDER — PANTOPRAZOLE SODIUM 20 MG PO TBEC
20.0000 mg | DELAYED_RELEASE_TABLET | Freq: Every day | ORAL | 1 refills | Status: DC
Start: 2022-11-02 — End: 2023-04-19

## 2022-11-02 MED ORDER — LOSARTAN POTASSIUM-HCTZ 100-12.5 MG PO TABS
1.0000 | ORAL_TABLET | Freq: Every day | ORAL | 1 refills | Status: DC
Start: 2022-11-02 — End: 2023-05-28

## 2022-11-02 MED ORDER — BISOPROLOL FUMARATE 5 MG PO TABS
ORAL_TABLET | ORAL | 11 refills | Status: DC
Start: 2022-11-02 — End: 2023-11-07

## 2022-11-02 MED ORDER — SILDENAFIL CITRATE 100 MG PO TABS
100.0000 mg | ORAL_TABLET | ORAL | 5 refills | Status: DC | PRN
Start: 2022-11-02 — End: 2023-02-05

## 2022-11-23 DIAGNOSIS — E1169 Type 2 diabetes mellitus with other specified complication: Secondary | ICD-10-CM | POA: Diagnosis not present

## 2022-11-23 DIAGNOSIS — E1165 Type 2 diabetes mellitus with hyperglycemia: Secondary | ICD-10-CM | POA: Diagnosis not present

## 2022-11-23 DIAGNOSIS — E1159 Type 2 diabetes mellitus with other circulatory complications: Secondary | ICD-10-CM | POA: Diagnosis not present

## 2022-11-23 DIAGNOSIS — I152 Hypertension secondary to endocrine disorders: Secondary | ICD-10-CM | POA: Diagnosis not present

## 2022-11-23 DIAGNOSIS — E785 Hyperlipidemia, unspecified: Secondary | ICD-10-CM | POA: Diagnosis not present

## 2022-11-23 DIAGNOSIS — Z794 Long term (current) use of insulin: Secondary | ICD-10-CM | POA: Diagnosis not present

## 2022-11-23 LAB — HEMOGLOBIN A1C: Hemoglobin A1C: 6.9

## 2022-12-04 DIAGNOSIS — B351 Tinea unguium: Secondary | ICD-10-CM | POA: Diagnosis not present

## 2022-12-04 DIAGNOSIS — E119 Type 2 diabetes mellitus without complications: Secondary | ICD-10-CM | POA: Diagnosis not present

## 2022-12-04 DIAGNOSIS — M79674 Pain in right toe(s): Secondary | ICD-10-CM | POA: Diagnosis not present

## 2022-12-04 DIAGNOSIS — M79675 Pain in left toe(s): Secondary | ICD-10-CM | POA: Diagnosis not present

## 2022-12-13 ENCOUNTER — Ambulatory Visit (INDEPENDENT_AMBULATORY_CARE_PROVIDER_SITE_OTHER): Payer: Medicare HMO

## 2022-12-13 VITALS — Ht 63.0 in | Wt 185.0 lb

## 2022-12-13 DIAGNOSIS — Z0000 Encounter for general adult medical examination without abnormal findings: Secondary | ICD-10-CM

## 2022-12-13 NOTE — Progress Notes (Signed)
Subjective:   Zachary Wallace is a 67 y.o. male who presents for Medicare Annual/Subsequent preventive examination.  Visit Complete: Virtual  I connected with  Zachary Wallace on 12/13/22 by a audio enabled telemedicine application and verified that I am speaking with the correct person using two identifiers.  Patient Location: Home  Provider Location: Home Office  I discussed the limitations of evaluation and management by telemedicine. The patient expressed understanding and agreed to proceed.  Vital Signs: Unable to obtain new vitals due to this being a telehealth visit.  Patient Medicare AWV questionnaire was completed by the patient on (not done); I have confirmed that all information answered by patient is correct and no changes since this date. Cardiac Risk Factors include: advanced age (>44men, >34 women);diabetes mellitus;male gender;dyslipidemia;hypertension;obesity (BMI >30kg/m2);sedentary lifestyle    Objective:    Today's Vitals   12/13/22 1040  Weight: 185 lb (83.9 kg)  Height: 5\' 3"  (1.6 m)   Body mass index is 32.77 kg/m.     12/13/2022   10:48 AM 02/04/2018   12:24 PM 10/05/2016    2:21 PM 05/16/2016    8:02 AM 11/14/2015    8:53 AM 08/16/2015    8:22 AM 04/01/2015    7:32 AM  Advanced Directives  Does Patient Have a Medical Advance Directive? Yes No No No No No No  Type of Estate agent of [place];Living will        Would patient like information on creating a medical advance directive?     No - patient declined information      Current Medications (verified) Outpatient Encounter Medications as of 12/13/2022  Medication Sig   albuterol (PROVENTIL) (2.5 MG/3ML) 0.083% nebulizer solution USE ONE ampule via NEBULIZER EVERY 6 HOURS AS NEEDED SHORTNESS OF BREATH OR wheezing   albuterol (VENTOLIN HFA) 108 (90 Base) MCG/ACT inhaler INHALE 1-2 PUFFS EVERY 6 HOURS AS NEEDED WHEEZING OR SHORTNESS OF BREATH   amLODipine (NORVASC) 10 MG tablet Take 1  tablet (10 mg total) by mouth daily.   aspirin 81 MG tablet Take 81 mg by mouth.   atorvastatin (LIPITOR) 40 MG tablet Take 1 tablet (40 mg total) by mouth daily.   B-D UF III MINI PEN NEEDLES 31G X 5 MM MISC    bisoprolol (ZEBETA) 5 MG tablet One twice daily   BREZTRI AEROSPHERE 160-9-4.8 MCG/ACT AERO INHALE 2 PUFFS BY MOUTH TWICE DAILY   dicyclomine (BENTYL) 10 MG capsule TAKE ONE CAPSULE BY MOUTH THREE TIMES DAILY AS NEEDED FOR MUSCLE SPASMS   Dulaglutide 0.75 MG/0.5ML SOPN Inject 0.5 mg into the skin once a week.   famotidine (PEPCID) 20 MG tablet One after supper   glucose blood (ONETOUCH VERIO) test strip    guaiFENesin (MUCINEX) 600 MG 12 hr tablet Take 1 tablet (600 mg total) by mouth daily.   insulin degludec (TRESIBA) 100 UNIT/ML FlexTouch Pen Inject into the skin.   losartan-hydrochlorothiazide (HYZAAR) 100-12.5 MG tablet Take 1 tablet by mouth daily.   nicotine (NICODERM CQ - DOSED IN MG/24 HR) 7 mg/24hr patch Place 1 patch (7 mg total) onto the skin daily.   pantoprazole (PROTONIX) 20 MG tablet Take 1 tablet (20 mg total) by mouth daily.   sildenafil (VIAGRA) 100 MG tablet Take 1 tablet (100 mg total) by mouth as needed for erectile dysfunction.   benzonatate (TESSALON) 100 MG capsule Take 1 capsule (100 mg total) by mouth 2 (two) times daily as needed for cough. (Patient not taking: Reported  on 12/13/2022)   No facility-administered encounter medications on file as of 12/13/2022.    Allergies (verified) Patient has no known allergies.   History: Past Medical History:  Diagnosis Date   COPD (chronic obstructive pulmonary disease) (HCC)    Diabetes mellitus without complication (HCC)    Hyperlipidemia    Hypertension    OSA (obstructive sleep apnea)    Past Surgical History:  Procedure Laterality Date   COLONOSCOPY WITH PROPOFOL N/A 02/04/2018   Procedure: COLONOSCOPY WITH PROPOFOL;  Surgeon: Toney Reil, MD;  Location: [facility] ENDOSCOPY;  Service: Gastroenterology;   Laterality: N/A;   HAND SURGERY Left    KNEE SURGERY Left    Family History  Problem Relation Age of Onset   Clotting disorder Mother    Diabetes Mother    Hypertension Mother    Heart attack Father    Hypertension Sister    Hypertension Brother    Vision loss Brother    Social History   Socioeconomic History   Marital status: Married    Spouse name: Not on file   Number of children: Not on file   Years of education: Not on file   Highest education level: Not on file  Occupational History   Occupation: retires custodian [employer]  Tobacco Use   Smoking status: Former    Current packs/day: 0.00    Average packs/day: 1 pack/day for 41.0 years (41.0 ttl pk-yrs)    Types: Cigarettes    Start date: 03/13/1981    Quit date: 03/13/2022    Years since quitting: 0.7    Passive exposure: Past   Smokeless tobacco: Never   Tobacco comments:    Smoked last on 12/11.  Trying to quit.   Using patches.  HFB  03/13/2022  Vaping Use   Vaping status: Never Used  Substance and Sexual Activity   Alcohol use: No    Alcohol/week: 0.0 standard drinks of alcohol   Drug use: No   Sexual activity: Yes    Partners: Male, Male  Other Topics Concern   Not on file  Social History Narrative   Not on file   Social Determinants of Health   Financial Resource Strain: Low Risk  (12/13/2022)   Overall Financial Resource Strain (CARDIA)    Difficulty of Paying Living Expenses: Not hard at all  Food Insecurity: No Food Insecurity (12/13/2022)   Hunger Vital Sign    Worried About Running Out of Food in the Last Year: Never true    Ran Out of Food in the Last Year: Never true  Transportation Needs: No Transportation Needs (12/13/2022)   PRAPARE - Administrator, Civil Service (Medical): No    Lack of Transportation (Non-Medical): No  Physical Activity: Inactive (12/13/2022)   Exercise Vital Sign    Days of Exercise per Week: 0 days    Minutes of Exercise per Session: 0  min  Stress: No Stress Concern Present (12/13/2022)   [employer] of Occupational Health - Occupational Stress Questionnaire    Feeling of Stress : Not at all  Social Connections: Socially Integrated (12/13/2022)   Social Connection and Isolation Panel [NHANES]    Frequency of Communication with Friends and Family: More than three times a week    Frequency of Social Gatherings with Friends and Family: Three times a week    Attends Religious Services: More than 4 times per year    Active Member of Clubs or Organizations: Yes    Attends Club or  Organization Meetings: More than 4 times per year    Marital Status: Married    Tobacco Counseling Counseling given: Not Answered Tobacco comments: Smoked last on 12/11.  Trying to quit.   Using patches.  HFB  03/13/2022   Clinical Intake:  Pre-visit preparation completed: No  Pain : No/denies pain     BMI - recorded: 32.77 Nutritional Status: BMI > 30  Obese Nutritional Risks: None Diabetes: Yes CBG done?: Yes (97 this am at home) CBG resulted in Enter/ Edit results?: No Did pt. bring in CBG monitor from home?: No  How often do you need to have someone help you when you read instructions, pamphlets, or other written materials from your doctor or pharmacy?: 1 - Never  Interpreter Needed?: No  Comments: lives with wife Information entered by :: B.Pascual Mantel,LPN   Activities of Daily Living    12/13/2022   10:48 AM 11/02/2022    9:06 AM  In your present state of health, do you have any difficulty performing the following activities:  Hearing? 0 0  Vision? 0 0  Difficulty concentrating or making decisions? 0 0  Walking or climbing stairs? 0 1  Dressing or bathing? 0 0  Doing errands, shopping? 0 0  Preparing Food and eating ? N   Using the Toilet? N   In the past six months, have you accidently leaked urine? N   Do you have problems with loss of bowel control? N   Managing your Medications? N   Managing your Finances? N    Housekeeping or managing your Housekeeping? N     Patient Care Team: Margarita Mail, DO as PCP - General (Internal Medicine) Debbe Odea, MD as PCP - Cardiology (Cardiology) Kennith Gain, NP as Nurse Practitioner (Family Medicine) Wynelle Fanny, MD as Referring Physician (Nephrology) Sherlon Handing, MD as Consulting Physician (Endocrinology)  Indicate any recent Medical Services you may have received from other than [facility] providers in the past year (date may be approximate).     Assessment:   This is a routine wellness examination for Aubra.  Hearing/Vision screen Hearing Screening - Comments:: Pt says he thinks he hears alright Vision Screening - Comments:: Pt says he only needs glasses for reading:vision is fine [facility]-    Goals Addressed             This Visit's Progress    Patient Stated       Pt says he hopes his asthma will get better       Depression Screen    12/13/2022   10:46 AM 11/02/2022    9:05 AM 06/28/2022    8:49 AM 05/24/2022    2:57 PM 03/28/2022    8:22 AM 12/26/2021   10:19 AM 09/25/2021   10:11 AM  PHQ 2/9 Scores  PHQ - 2 Score 0 3 0 0 5 0 0  PHQ- 9 Score  3 0  8 0 0    Fall Risk    12/13/2022   10:43 AM 11/02/2022    9:04 AM 06/28/2022    8:48 AM 05/24/2022    2:50 PM 03/28/2022    8:22 AM  Fall Risk   Falls in the past year? 0 0 0 0 0  Number falls in past yr: 0 0 0 0 0  Injury with Fall? 0 0 0 0 0  Risk for fall due to : No Fall Risks   No Fall Risks   Follow up Education provided;Falls prevention discussed  Falls prevention discussed     MEDICARE RISK AT HOME: Medicare Risk at Home Any stairs in or around the home?: Yes If so, are there any without handrails?: Yes Home free of loose throw rugs in walkways, pet beds, electrical cords, etc?: Yes Adequate lighting in your home to reduce risk of falls?: Yes Life alert?: No Use of a cane, walker or w/c?: No Grab bars in the bathroom?: Yes Shower chair or bench in  shower?: No Elevated toilet seat or a handicapped toilet?: No  TIMED UP AND GO:  Was the test performed?  No    Cognitive Function:        12/13/2022   10:49 AM  6CIT Screen  What Year? 0 points  What month? 0 points  What time? 0 points  Count back from 20 0 points  Months in reverse 2 points  Repeat phrase 4 points  Total Score 6 points    Immunizations Immunization History  Administered Date(s) Administered   Fluad Quad(high Dose 65+) 12/26/2021   Influenza,inj,Quad PF,6+ Mos 12/02/2014, 12/02/2017   Influenza,inj,quad, With Preservative 01/05/2019   Influenza-Unspecified 12/30/2019, 11/24/2020   Moderna Sars-Covid-2 Vaccination 03/08/2020   PNEUMOCOCCAL CONJUGATE-20 09/01/2020   Pneumococcal Conjugate-13 06/30/2013, 12/02/2014   Pneumococcal Polysaccharide-23 02/27/2008   Tdap 02/03/2010, 09/25/2021   Zoster Recombinant(Shingrix) 12/02/2017    TDAP status: Up to date  Flu Vaccine status: Up to date  Pneumococcal vaccine status: Up to date  Covid-19 vaccine status: Completed vaccines  Qualifies for Shingles Vaccine? Yes   Zostavax completed Yes   Shingrix Completed?: Yes  Screening Tests Health Maintenance  Topic Date Due   INFLUENZA VACCINE  10/25/2022   COVID-19 Vaccine (2 - 2023-24 season) 11/25/2022   Zoster Vaccines- Shingrix (2 of 2) 02/02/2023 (Originally 01/27/2018)   OPHTHALMOLOGY EXAM  12/26/2022   HEMOGLOBIN A1C  12/28/2022   Lung Cancer Screening  04/20/2023   Diabetic kidney evaluation - eGFR measurement  06/28/2023   FOOT EXAM  07/23/2023   Diabetic kidney evaluation - Urine ACR  08/29/2023   Medicare Annual Wellness (AWV)  12/13/2023   Colonoscopy  02/05/2028   DTaP/Tdap/Td (3 - Td or Tdap) 09/26/2031   Pneumonia Vaccine 74+ Years old  Completed   Hepatitis C Screening  Completed   HIV Screening  Completed   HPV VACCINES  Aged Out    Health Maintenance  Health Maintenance Due  Topic Date Due   INFLUENZA VACCINE  10/25/2022    COVID-19 Vaccine (2 - 2023-24 season) 11/25/2022    Colorectal cancer screening: Type of screening: Colonoscopy. Completed yes. Repeat every 5-10 years  Lung Cancer Screening: (Low Dose CT Chest recommended if Age 79-80 years, 20 pack-year currently smoking OR have quit w/in 15years.) does not qualify.   Lung Cancer Screening Referral: no had in Jan2024  Additional Screening:  Hepatitis C Screening: does not qualify; Completed yes  Vision Screening: Recommended annual ophthalmology exams for early detection of glaucoma and other disorders of the eye. Is the patient up to date with their annual eye exam?  Yes  Who is the provider or what is the name of the office in which the patient attends annual eye exams? [facility] does not know name If pt is not established with a provider, would they like to be referred to a provider to establish care? No .   Dental Screening: Recommended annual dental exams for proper oral hygiene  Diabetic Foot Exam: n/a  Community Resource Referral / Chronic Care Management: CRR  required this visit?  No   CCM required this visit?  No    Plan:     I have personally reviewed and noted the following in the patient's chart:   Medical and social history Use of alcohol, tobacco or illicit drugs  Current medications and supplements including opioid prescriptions. Patient is not currently taking opioid prescriptions. Functional ability and status Nutritional status Physical activity Advanced directives List of other physicians Hospitalizations, surgeries, and ER visits in previous 12 months Vitals Screenings to include cognitive, depression, and falls Referrals and appointments  In addition, I have reviewed and discussed with patient certain preventive protocols, quality metrics, and best practice recommendations. A written personalized care plan for preventive services as well as general preventive health recommendations were provided to patient.      Sue Lush, LPN   6/64/4034   After Visit Summary: (Declined) Due to this being a telephonic visit, with patients personalized plan was offered to patient but patient Declined AVS at this time   Nurse Notes: The patient states he is doing well and has no concerns or questions at this time.

## 2022-12-13 NOTE — Patient Instructions (Signed)
Mr. Zachary Wallace , Thank you for taking time to come for your Medicare Wellness Visit. I appreciate your ongoing commitment to your health goals. Please review the following plan we discussed and let me know if I can assist you in the future.   Referrals/Orders/Follow-Ups/Clinician Recommendations: none  This is a list of the screening recommended for you and due dates:  Health Maintenance  Topic Date Due   Flu Shot  10/25/2022   COVID-19 Vaccine (2 - 2023-24 season) 11/25/2022   Zoster (Shingles) Vaccine (2 of 2) 02/02/2023*   Eye exam for diabetics  12/26/2022   Hemoglobin A1C  12/28/2022   Screening for Lung Cancer  04/20/2023   Yearly kidney function blood test for diabetes  06/28/2023   Complete foot exam   07/23/2023   Yearly kidney health urinalysis for diabetes  08/29/2023   Medicare Annual Wellness Visit  12/13/2023   Colon Cancer Screening  02/05/2028   DTaP/Tdap/Td vaccine (3 - Td or Tdap) 09/26/2031   Pneumonia Vaccine  Completed   Hepatitis C Screening  Completed   HIV Screening  Completed   HPV Vaccine  Aged Out  *Topic was postponed. The date shown is not the original due date.    Advanced directives: (Copy Requested) Please bring a copy of your health care power of attorney and living will to the office to be added to your chart at your convenience.  Next Medicare Annual Wellness Visit scheduled for next year: Yes 12/19/23 @ 10:15am telephone

## 2022-12-20 ENCOUNTER — Ambulatory Visit: Payer: Medicare HMO | Admitting: Pulmonary Disease

## 2022-12-20 ENCOUNTER — Encounter: Payer: Self-pay | Admitting: Pulmonary Disease

## 2022-12-20 VITALS — BP 126/76 | HR 59 | Temp 97.6°F | Ht 63.0 in | Wt 187.0 lb

## 2022-12-20 DIAGNOSIS — J441 Chronic obstructive pulmonary disease with (acute) exacerbation: Secondary | ICD-10-CM | POA: Diagnosis not present

## 2022-12-20 MED ORDER — BREZTRI AEROSPHERE 160-9-4.8 MCG/ACT IN AERO: 2.0000 | INHALATION_SPRAY | Freq: Two times a day (BID) | RESPIRATORY_TRACT | Status: DC

## 2022-12-20 NOTE — Progress Notes (Signed)
Synopsis: Referred in  by Margarita Mail, DO   Subjective:   PATIENT ID: Zachary Wallace: male DOB: 1955/06/21, MRN: [id number]  Chief Complaint  Patient presents with   Follow-up    Shortness of breath on exertion. Occasional wheezing at night time.     HPI Zachary Wallace is a pleasant 67 year old male patient with a past medical history of COPD stage IV gp B on triple inhaler therapy previously following with Dr. Sherene Sires presenting today to the pulmonary clinic for a follow up visit.   Last seen Dr. Sherene Sires in March 2024 presenting today for a follow up visit. Reports shortness of breath mostly on exertion associated with cough sometimes dry and sometimes productive. Unable to go uphill without feeling shortness of breath. CAT in clinic 20.   He is enrolled in the low dose CT scan program with recent CT chest lung rads 2. pending repeat CT in January 2025. It showed emphysema.   FH - Denies any family history of pulmonary diseases.   Social History - Quit smoking last year. Smoked 1/2 ppd for years.    ROS All systems were reviewed and are negative except for the above.  Objective:   Vitals:   12/20/22 1011  BP: 126/76  Pulse: (!) 59  Temp: 97.6 F (36.4 C)  TempSrc: Temporal  SpO2: 96%  Weight: 187 lb (84.8 kg)  Height: 5\' 3"  (1.6 m)   96% on RA BMI Readings from Last 3 Encounters:  12/20/22 33.13 kg/m  12/13/22 32.77 kg/m  11/02/22 29.97 kg/m   Wt Readings from Last 3 Encounters:  12/20/22 187 lb (84.8 kg)  12/13/22 185 lb (83.9 kg)  11/02/22 185 lb 11.2 oz (84.2 kg)    Physical Exam GEN: NAD HEENT: Supple Neck, Reactive Pupils, EOMI  CVS: Normal S1, Normal S2, RRR, No murmurs or ES appreciated  Lungs: Clear bilateral air entry.  Abdomen: Soft, non tender, non distended, + BS  Extremities: Warm and well perfused, No edema  Skin: No suspicious lesions appreciated  Psych: Normal Affect  Ancillary Information   CBC    Component Value Date/Time    WBC 6.0 02/23/2021 0920   RBC 5.23 02/23/2021 0920   HGB 15.5 02/23/2021 0920   HCT 46.9 02/23/2021 0920   PLT 193 02/23/2021 0920   MCV 89.7 02/23/2021 0920   MCH 29.6 02/23/2021 0920   MCHC 33.0 02/23/2021 0920   RDW 12.3 02/23/2021 0920   LYMPHSABS 1,554 02/23/2021 0920   EOSABS 300 02/23/2021 0920   BASOSABS 30 02/23/2021 0920    Imaging  LDCT 03/2022  Central airways are patent. Mild centrilobular emphysema. Mild bilateral bronchial wall thickening, unchanged when compared with the prior exam. No consolidation, pleural effusion or pneumothorax. Previously seen small solid pulmonary nodules have resolved. New tiny solid nodule of the left lower lobe measuring 2.2 mm on image 231.    Latest Ref Rng & Units 08/17/2021    9:55 AM  PFT Results  FVC-Pre L 2.35   FVC-Predicted Pre % 61   FVC-Post L 2.33   FVC-Predicted Post % 61   Pre FEV1/FVC % % 38   Post FEV1/FCV % % 41   FEV1-Pre L 0.88   FEV1-Predicted Pre % 30   FEV1-Post L 0.95   DLCO uncorrected ml/min/mmHg 11.19   DLCO UNC% % 42   DLVA Predicted % 50   TLC L 5.75   TLC % Predicted % 84   RV % Predicted %  113      Assessment & Plan:  Zachary Wallace is a pleasant 67 year old male patient with a past medical history of COPD stage IV gp B on triple inhaler therapy previously following with Dr. Sherene Sires presenting today to the pulmonary clinic for a follow up visit.   #COPD stage III-IV gp B CAT 20   []  c/w Budesonide-Formoterol-Glycopyrrolate [Breztri] 2 puffs BID.  []  c/w Albuterol 2puffs Q6H as needed  []  6 min walk test.  []  Referral for Pulmonary Rehab placed.   #Tobacco use  Remains Abstinent from smoking.  Enrolled in LDCT next CT chest on 03/2023.     Return in about 6 months (around 06/19/2023).  I spent 40 minutes caring for this patient today, including preparing to see the patient, obtaining a medical history , reviewing a separately obtained history, performing a medically appropriate examination  and/or evaluation, counseling and educating the patient/family/caregiver, ordering medications, tests, or procedures, documenting clinical information in the electronic health record, and independently interpreting results (not separately reported/billed) and communicating results to the patient/family/caregiver  Janann Colonel, MD [facility] 12/20/2022 12:30 PM

## 2023-01-02 DIAGNOSIS — H269 Unspecified cataract: Secondary | ICD-10-CM | POA: Diagnosis not present

## 2023-01-02 DIAGNOSIS — E119 Type 2 diabetes mellitus without complications: Secondary | ICD-10-CM | POA: Diagnosis not present

## 2023-01-07 ENCOUNTER — Encounter: Payer: Self-pay | Admitting: Family Medicine

## 2023-01-07 LAB — HM DIABETES EYE EXAM

## 2023-01-18 ENCOUNTER — Other Ambulatory Visit: Payer: Self-pay | Admitting: Internal Medicine

## 2023-01-18 DIAGNOSIS — J42 Unspecified chronic bronchitis: Secondary | ICD-10-CM

## 2023-01-21 NOTE — Telephone Encounter (Signed)
Requested Prescriptions  Pending Prescriptions Disp Refills   albuterol (VENTOLIN HFA) 108 (90 Base) MCG/ACT inhaler [Pharmacy Med Name: albuterol sulfate HFA 90 mcg/actuation aerosol inhaler] 18 g 0    Sig: INHALE 1-2 PUFFS EVERY 6 HOURS AS NEEDED WHEEZING OR FOR SHORTNESS OF BREATH     Pulmonology:  Beta Agonists 2 Passed - 01/18/2023 11:43 AM      Passed - Last BP in normal range    BP Readings from Last 1 Encounters:  12/20/22 126/76         Passed - Last Heart Rate in normal range    Pulse Readings from Last 1 Encounters:  12/20/22 (!) 59         Passed - Valid encounter within last 12 months    Recent Outpatient Visits           2 months ago Essential hypertension   [facility] Margarita Mail, DO   6 months ago Type 2 diabetes mellitus with stage 3b chronic kidney disease, with long-term current use of insulin [facility])   [facility] Margarita Mail, DO   8 months ago COPD with acute exacerbation [facility])   [facility] Margarita Mail, DO   9 months ago COPD mixed type [facility])   [facility] Margarita Mail, DO   1 year ago Tobacco dependence   [facility] Margarita Mail, DO       Future Appointments             In 2 weeks Margarita Mail, DO [place] [facility]

## 2023-02-04 NOTE — Progress Notes (Unsigned)
Established Office Visit  Subjective:     Patient ID: Zachary Wallace, male    DOB: 06-25-1955, 67 y.o.   MRN: [id number]  No chief complaint on file.   HPI Patient is in today for follow up. Patient feels like he is a little more short of breath today than normal but otherwise feels good.  Diabetes, Type 2: -Following with Endocrinology, last seen 11/23/22 -Last A1c 6.9% 8/24 at [facility] -Medications: Tresiba 28 units in the morning, Trulicity 0.75 mg weekly on Sundays  -Patient is compliant with the above medications and reports no side effects.  -Eye exam: UTD 10/23 -Foot exam: Following with Podiatry  -Microalbumin: UTD 6/24 -Statin: yes -PNA vaccine: UTD -Denies symptoms of hypoglycemia, polyuria, polydipsia, numbness extremities, foot ulcers/trauma.   HLD: -Medications: Lipitor 40 mg  -Patient is compliant with above medications and reports no side effects.  -Last lipid panel: Lipid Panel     Component Value Date/Time   CHOL 106 07/02/2022 1153   TRIG 111 07/02/2022 1153   HDL 41 07/02/2022 1153   CHOLHDL 2.6 07/02/2022 1153   VLDL 22 07/02/2022 1153   LDLCALC 43 07/02/2022 1153   LDLCALC 83 09/01/2020 1015    Hypertension: -Medications: Amlodipine 10 mg, Losartan-HCTZ 100-12.5, Bisoprolol 5 mg BID  -Patient is compliant with above medications and reports no side effects. -Checking BP at home (average): No -Denies any SOB, CP, vision changes, LE edema or symptoms of hypotension -Following with Cardiology, last seen 04/20/22 -Echo 06/13/22 55-60%  COPD: -COPD status: stable -Current medications: Breztri BID, albuterol twice daily -Satisfied with current treatment?: yes -Oxygen use: no -Dyspnea frequency: Occasionally, slightly worse lately with the humidity  -Cough frequency: Occasional but no more than normal  -Rescue inhaler frequency: twice daily -Limitation of activity: yes -Productive cough: Yes, white thick but stable -Pneumovax: Up to Date -Influenza: Up  to Date -Lung cancer screening LUNG-RADS-2 1/23, scheduled for 1/24 -Following with Pulmonology, last seen 06/01/22 -Working on quitting smoking, now down to 1 cigarettes occasionally   GERD:  -Currently on Protonix 40 mg, symptoms well controlled  Health Maintenance: -Blood work UTD -Colonoscopy 01/2018, repeat in 10 years -Lung cancer screening 1/24 - Lung-RADS2   Review of Systems  Constitutional:  Negative for chills and fever.  Respiratory:  Positive for shortness of breath. Negative for cough and wheezing.   Cardiovascular:  Negative for chest pain.  Neurological:  Negative for dizziness and headaches.      Objective:    There were no vitals taken for this visit. BP Readings from Last 3 Encounters:  12/20/22 126/76  11/02/22 122/72  07/31/22 120/80   Wt Readings from Last 3 Encounters:  12/20/22 187 lb (84.8 kg)  12/13/22 185 lb (83.9 kg)  11/02/22 185 lb 11.2 oz (84.2 kg)      Physical Exam Constitutional:      Appearance: Normal appearance.  HENT:     Head: Normocephalic and atraumatic.     Mouth/Throat:     Mouth: Mucous membranes are moist.     Pharynx: Oropharynx is clear.  Eyes:     Extraocular Movements: Extraocular movements intact.     Conjunctiva/sclera: Conjunctivae normal.     Pupils: Pupils are equal, round, and reactive to light.  Cardiovascular:     Rate and Rhythm: Normal rate and regular rhythm.  Pulmonary:     Effort: Pulmonary effort is normal.     Breath sounds: Normal breath sounds. No wheezing, rhonchi or rales.  Comments: Decreased air movement throughout but no wheezes, rhonchi Skin:    General: Skin is warm and dry.  Neurological:     General: No focal deficit present.     Mental Status: He is alert. Mental status is at baseline.  Psychiatric:        Mood and Affect: Mood normal.        Behavior: Behavior normal.     No results found for any visits on 02/05/23.       Assessment & Plan:   1. Essential hypertension:  Blood pressure well controlled, no changes made to medications but refills ordered.   - bisoprolol (ZEBETA) 5 MG tablet; One twice daily  Dispense: 60 tablet; Refill: 11 - losartan-hydrochlorothiazide (HYZAAR) 100-12.5 MG tablet; Take 1 tablet by mouth daily.  Dispense: 90 tablet; Refill: 1  2. Mixed hyperlipidemia: Stable, doing well on the Lipitor.   3. COPD mixed type [facility]): Lungs sound good today, can use an extra dose of Albuterol when feeling short of breath. Doing well on Breztri.   4. Gastroesophageal reflux disease, unspecified whether esophagitis present: Symptoms well controlled, will decrease dose to 20 mg to decrease risk of long term side effects.   - pantoprazole (PROTONIX) 20 MG tablet; Take 1 tablet (20 mg total) by mouth daily.  Dispense: 90 tablet; Refill: 1  5. Erectile dysfunction, unspecified erectile dysfunction type: Refills ordered.   - sildenafil (VIAGRA) 100 MG tablet; Take 1 tablet (100 mg total) by mouth as needed for erectile dysfunction.  Dispense: 30 tablet; Refill: 5   No follow-ups on file.  Margarita Mail, DO

## 2023-02-05 ENCOUNTER — Ambulatory Visit (INDEPENDENT_AMBULATORY_CARE_PROVIDER_SITE_OTHER): Payer: Medicare HMO | Admitting: Internal Medicine

## 2023-02-05 ENCOUNTER — Encounter: Payer: Self-pay | Admitting: Internal Medicine

## 2023-02-05 VITALS — BP 124/86 | HR 72 | Temp 98.1°F | Resp 18 | Ht 66.0 in | Wt 187.1 lb

## 2023-02-05 DIAGNOSIS — N529 Male erectile dysfunction, unspecified: Secondary | ICD-10-CM | POA: Diagnosis not present

## 2023-02-05 DIAGNOSIS — E782 Mixed hyperlipidemia: Secondary | ICD-10-CM

## 2023-02-05 DIAGNOSIS — I10 Essential (primary) hypertension: Secondary | ICD-10-CM

## 2023-02-05 DIAGNOSIS — K219 Gastro-esophageal reflux disease without esophagitis: Secondary | ICD-10-CM

## 2023-02-05 DIAGNOSIS — L989 Disorder of the skin and subcutaneous tissue, unspecified: Secondary | ICD-10-CM

## 2023-02-05 DIAGNOSIS — E1122 Type 2 diabetes mellitus with diabetic chronic kidney disease: Secondary | ICD-10-CM | POA: Diagnosis not present

## 2023-02-05 DIAGNOSIS — J42 Unspecified chronic bronchitis: Secondary | ICD-10-CM | POA: Diagnosis not present

## 2023-02-05 DIAGNOSIS — N1832 Chronic kidney disease, stage 3b: Secondary | ICD-10-CM

## 2023-02-05 DIAGNOSIS — Z794 Long term (current) use of insulin: Secondary | ICD-10-CM

## 2023-02-05 MED ORDER — BREZTRI AEROSPHERE 160-9-4.8 MCG/ACT IN AERO: 2.0000 | INHALATION_SPRAY | Freq: Two times a day (BID) | RESPIRATORY_TRACT | 11 refills | Status: DC

## 2023-02-05 MED ORDER — SILDENAFIL CITRATE 100 MG PO TABS: 100.0000 mg | ORAL_TABLET | ORAL | 5 refills | Status: DC | PRN

## 2023-02-05 MED ORDER — ALBUTEROL SULFATE HFA 108 (90 BASE) MCG/ACT IN AERS: 1.0000 | INHALATION_SPRAY | RESPIRATORY_TRACT | 3 refills | Status: DC | PRN

## 2023-02-05 NOTE — Patient Instructions (Addendum)
It was great seeing you today!  Plan discussed at today's visit: -Recommend new COVID booster and RSV vaccine this year  -Refills sent to pharmacy -Referral to Dermatology placed  Follow up in: 3 months   Take care and let us know if you have any questions or concerns prior to your next visit.  Dr. Caralee Ates

## 2023-02-12 ENCOUNTER — Other Ambulatory Visit: Payer: Self-pay | Admitting: Internal Medicine

## 2023-02-12 DIAGNOSIS — R109 Unspecified abdominal pain: Secondary | ICD-10-CM

## 2023-02-12 NOTE — Telephone Encounter (Signed)
Medication Refill -  Most Recent Primary Care Visit:  Provider: Margarita Mail  Department: [facility]  Visit Type: OFFICE VISIT  Date: 02/05/2023  Medication:  dicyclomine (BENTYL) 10 MG capsule [2418]   Has the patient contacted their pharmacy? Yes (  Is this the correct pharmacy for this prescription? Yes If no, delete pharmacy and type the correct one.  This is the patient's preferred pharmacy:  [facility] - [place], [place] - [address] [place] [address] Phone: [phone] Fax: [phone]   Has the prescription been filled recently?  3 months ago   Is the patient out of the medication? Yes  Has the patient been seen for an appointment in the last year OR does the patient have an upcoming appointment? Yes  Can we respond through MyChart? No  Agent: Please be advised that Rx refills may take up to 3 business days. We ask that you follow-up with your pharmacy.

## 2023-02-13 NOTE — Telephone Encounter (Signed)
Requested Prescriptions  Pending Prescriptions Disp Refills   dicyclomine (BENTYL) 10 MG capsule [Pharmacy Med Name: dicyclomine 10 mg capsule] 270 capsule 0    Sig: TAKE ONE CAPSULE BY MOUTH THREE TIMES DAILY AS NEEDED FOR MUSCLE SPASMS     Gastroenterology:  Antispasmodic Agents Passed - 02/12/2023 11:03 AM      Passed - Valid encounter within last 12 months    Recent Outpatient Visits           1 week ago Chronic bronchitis, unspecified chronic bronchitis type [facility])   [place] [facility] Margarita Mail, DO   3 months ago Essential hypertension   [place] [facility] Margarita Mail, DO   7 months ago Type 2 diabetes mellitus with stage 3b chronic kidney disease, with long-term current use of insulin [facility])    [facility] Margarita Mail, DO   8 months ago COPD with acute exacerbation [facility])   [facility] Margarita Mail, DO   10 months ago COPD mixed type [facility])   [facility] Margarita Mail, DO       Future Appointments             In 2 months Elie Goody, MD [facility]   In 2 months Margarita Mail, DO [facility], [facility]

## 2023-04-08 ENCOUNTER — Other Ambulatory Visit: Payer: Self-pay | Admitting: Internal Medicine

## 2023-04-08 DIAGNOSIS — E782 Mixed hyperlipidemia: Secondary | ICD-10-CM

## 2023-04-09 NOTE — Telephone Encounter (Signed)
 Requested Prescriptions  Pending Prescriptions Disp Refills   atorvastatin  (LIPITOR) 40 MG tablet [Pharmacy Med Name: atorvastatin  40 mg tablet] 90 tablet 0    Sig: TAKE ONE TABLET BY MOUTH ONCE DAILY     Cardiovascular:  Antilipid - Statins Failed - 04/09/2023  1:33 PM      Failed - Lipid Panel in normal range within the last 12 months    Cholesterol  Date Value Ref Range Status  07/02/2022 106 0 - 200 mg/dL Final   LDL Cholesterol (Calc)  Date Value Ref Range Status  09/01/2020 83 mg/dL (calc) Final    Comment:    Reference range: <100 . Desirable range <100 mg/dL for primary prevention;   <70 mg/dL for patients with CHD or diabetic patients  with > or = 2 CHD risk factors. SABRA LDL-C is now calculated using the Martin-Hopkins  calculation, which is a validated novel method providing  better accuracy than the Friedewald equation in the  estimation of LDL-C.  Gladis APPLETHWAITE et al. SANDREA. 7986;689(80): 2061-2068  (http://education.[employer].com/faq/FAQ164)    LDL Cholesterol  Date Value Ref Range Status  07/02/2022 43 0 - 99 mg/dL Final    Comment:           Total Cholesterol/HDL:CHD Risk Coronary Heart Disease Risk Table                     Men   Women  1/2 Average Risk   3.4   3.3  Average Risk       5.0   4.4  2 X Average Risk   9.6   7.1  3 X Average Risk  23.4   11.0        Use the calculated Patient Ratio above and the CHD Risk Table to determine the patient's CHD Risk.        ATP III CLASSIFICATION (LDL):  <100     mg/dL   Optimal  899-870  mg/dL   Near or Above                    Optimal  130-159  mg/dL   Borderline  839-810  mg/dL   High  >809     mg/dL   Very High Performed at [facility], [address] [place], [place] [address]    HDL  Date Value Ref Range Status  07/02/2022 41 >40 mg/dL Final   Triglycerides  Date Value Ref Range Status  07/02/2022 111 <150 mg/dL Final         Passed - Patient is not pregnant      Passed -  Valid encounter within last 12 months    Recent Outpatient Visits           2 months ago Chronic bronchitis, unspecified chronic bronchitis type [facility])   [facility] Bernardo Fend, DO   5 months ago Essential hypertension   [facility] Bernardo Fend, DO   9 months ago Type 2 diabetes mellitus with stage 3b chronic kidney disease, with long-term current use of insulin [facility])   [place] [facility] Fend, DO   10 months ago COPD with acute exacerbation [facility])   [facility] Fend, DO   1 year ago COPD mixed type [facility])   [facility] Bernardo Fend, DO       Future Appointments  In 3 weeks Claudene Lehmann, MD [facility]   In 4 weeks Bernardo Fend, DO [facility], [facility]

## 2023-04-19 ENCOUNTER — Other Ambulatory Visit: Payer: Self-pay | Admitting: Internal Medicine

## 2023-04-19 DIAGNOSIS — K219 Gastro-esophageal reflux disease without esophagitis: Secondary | ICD-10-CM

## 2023-04-19 NOTE — Telephone Encounter (Signed)
Requested Prescriptions  Pending Prescriptions Disp Refills   pantoprazole (PROTONIX) 20 MG tablet [Pharmacy Med Name: pantoprazole 20 mg tablet,delayed release] 90 tablet 1    Sig: TAKE ONE TABLET BY MOUTH ONCE DAILY     Gastroenterology: Proton Pump Inhibitors Passed - 04/19/2023  1:56 PM      Passed - Valid encounter within last 12 months    Recent Outpatient Visits           2 months ago Chronic bronchitis, unspecified chronic bronchitis type [facility])   [place] Margarita Mail, DO   5 months ago Essential hypertension   [facility] Margarita Mail, DO   9 months ago Type 2 diabetes mellitus with stage 3b chronic kidney disease, with long-term current use of insulin [facility])    [facility] Margarita Mail, DO   11 months ago COPD with acute exacerbation [facility])   [facility] Margarita Mail, DO   1 year ago COPD mixed type [facility])   [facility] Margarita Mail, DO       Future Appointments             In 1 week Elie Goody, MD [facility]   In 2 weeks Margarita Mail, DO [facility]

## 2023-04-22 ENCOUNTER — Ambulatory Visit: Admission: RE | Admit: 2023-04-22 | Payer: Medicare HMO | Source: Ambulatory Visit

## 2023-04-23 ENCOUNTER — Ambulatory Visit
Admission: RE | Admit: 2023-04-23 | Discharge: 2023-04-23 | Disposition: A | Discharge status: Home / Self Care | Payer: Medicare HMO | Source: Ambulatory Visit | Attending: Acute Care | Admitting: Acute Care

## 2023-04-23 DIAGNOSIS — Z122 Encounter for screening for malignant neoplasm of respiratory organs: Secondary | ICD-10-CM | POA: Insufficient documentation

## 2023-04-23 DIAGNOSIS — Z87891 Personal history of nicotine dependence: Secondary | ICD-10-CM | POA: Insufficient documentation

## 2023-05-01 ENCOUNTER — Ambulatory Visit: Payer: Medicare HMO | Admitting: Dermatology

## 2023-05-03 ENCOUNTER — Other Ambulatory Visit: Payer: Self-pay

## 2023-05-03 DIAGNOSIS — Z87891 Personal history of nicotine dependence: Secondary | ICD-10-CM

## 2023-05-03 DIAGNOSIS — Z122 Encounter for screening for malignant neoplasm of respiratory organs: Secondary | ICD-10-CM

## 2023-05-08 ENCOUNTER — Ambulatory Visit: Payer: Self-pay | Admitting: Internal Medicine

## 2023-05-10 ENCOUNTER — Other Ambulatory Visit: Payer: Self-pay | Admitting: Internal Medicine

## 2023-05-10 DIAGNOSIS — I10 Essential (primary) hypertension: Secondary | ICD-10-CM

## 2023-05-10 NOTE — Telephone Encounter (Signed)
Requested Prescriptions  Pending Prescriptions Disp Refills   amLODipine (NORVASC) 10 MG tablet [Pharmacy Med Name: amlodipine 10 mg tablet] 90 tablet 0    Sig: TAKE ONE TABLET BY MOUTH ONCE DAILY     Cardiovascular: Calcium Channel Blockers 2 Passed - 05/10/2023  5:16 PM      Passed - Last BP in normal range    BP Readings from Last 1 Encounters:  02/05/23 124/86         Passed - Last Heart Rate in normal range    Pulse Readings from Last 1 Encounters:  02/05/23 72         Passed - Valid encounter within last 6 months    Recent Outpatient Visits           3 months ago Chronic bronchitis, unspecified chronic bronchitis type [facility])   [facility] Margarita Mail, DO   6 months ago Essential hypertension   [place] Margarita Mail, DO   10 months ago Type 2 diabetes mellitus with stage 3b chronic kidney disease, with long-term current use of insulin [facility])    [facility] Margarita Mail, DO   11 months ago COPD with acute exacerbation [facility])   [facility] Margarita Mail, DO   1 year ago COPD mixed type [facility])   [facility] Margarita Mail, DO       Future Appointments             In 2 weeks Margarita Mail, DO [facility]   In 1 month Elie Goody, MD [facility] [place]

## 2023-05-24 DIAGNOSIS — E559 Vitamin D deficiency, unspecified: Secondary | ICD-10-CM | POA: Diagnosis not present

## 2023-05-24 DIAGNOSIS — Z794 Long term (current) use of insulin: Secondary | ICD-10-CM | POA: Diagnosis not present

## 2023-05-24 DIAGNOSIS — E1169 Type 2 diabetes mellitus with other specified complication: Secondary | ICD-10-CM | POA: Diagnosis not present

## 2023-05-24 DIAGNOSIS — E1159 Type 2 diabetes mellitus with other circulatory complications: Secondary | ICD-10-CM | POA: Diagnosis not present

## 2023-05-24 DIAGNOSIS — I152 Hypertension secondary to endocrine disorders: Secondary | ICD-10-CM | POA: Diagnosis not present

## 2023-05-24 DIAGNOSIS — E1165 Type 2 diabetes mellitus with hyperglycemia: Secondary | ICD-10-CM | POA: Diagnosis not present

## 2023-05-24 DIAGNOSIS — E785 Hyperlipidemia, unspecified: Secondary | ICD-10-CM | POA: Diagnosis not present

## 2023-05-24 LAB — PROTEIN / CREATININE RATIO, URINE: Creatinine, Urine: 87.5

## 2023-05-24 LAB — HEMOGLOBIN A1C: Hemoglobin A1C: 7.6

## 2023-05-24 LAB — COMPREHENSIVE METABOLIC PANEL: eGFR: 34

## 2023-05-28 ENCOUNTER — Other Ambulatory Visit: Payer: Self-pay

## 2023-05-28 ENCOUNTER — Encounter: Payer: Self-pay | Admitting: Internal Medicine

## 2023-05-28 ENCOUNTER — Ambulatory Visit: Payer: Medicare HMO | Admitting: Internal Medicine

## 2023-05-28 VITALS — BP 122/84 | HR 83 | Temp 97.8°F | Resp 16 | Ht 66.0 in | Wt 189.0 lb

## 2023-05-28 DIAGNOSIS — E1122 Type 2 diabetes mellitus with diabetic chronic kidney disease: Secondary | ICD-10-CM | POA: Diagnosis not present

## 2023-05-28 DIAGNOSIS — E782 Mixed hyperlipidemia: Secondary | ICD-10-CM | POA: Diagnosis not present

## 2023-05-28 DIAGNOSIS — Z794 Long term (current) use of insulin: Secondary | ICD-10-CM

## 2023-05-28 DIAGNOSIS — N184 Chronic kidney disease, stage 4 (severe): Secondary | ICD-10-CM

## 2023-05-28 DIAGNOSIS — I10 Essential (primary) hypertension: Secondary | ICD-10-CM | POA: Diagnosis not present

## 2023-05-28 DIAGNOSIS — J42 Unspecified chronic bronchitis: Secondary | ICD-10-CM | POA: Insufficient documentation

## 2023-05-28 DIAGNOSIS — N1832 Chronic kidney disease, stage 3b: Secondary | ICD-10-CM

## 2023-05-28 MED ORDER — AMLODIPINE BESYLATE 10 MG PO TABS: 10.0000 mg | ORAL_TABLET | Freq: Every day | ORAL | 1 refills | Status: DC

## 2023-05-28 MED ORDER — ATORVASTATIN CALCIUM 40 MG PO TABS: 40.0000 mg | ORAL_TABLET | Freq: Every day | ORAL | 1 refills | Status: DC

## 2023-05-28 MED ORDER — LOSARTAN POTASSIUM-HCTZ 100-12.5 MG PO TABS: 1.0000 | ORAL_TABLET | Freq: Every day | ORAL | 1 refills | Status: DC

## 2023-05-28 NOTE — Assessment & Plan Note (Signed)
 GFR decreased on last set of labs, following with Nephrology.

## 2023-05-28 NOTE — Progress Notes (Signed)
 Established Office Visit  Subjective:     Patient ID: Zachary Wallace, male    DOB: 1955-08-08, 68 y.o.   MRN: [id number]  Chief Complaint  Patient presents with   Medical Management of Chronic Issues    3 month recheck   Cough    Coughing spells to the point he feels like he black out    Cough Associated symptoms include shortness of breath. Pertinent negatives include no chest pain, chills, fever, headaches or wheezing.   Patient is in today for follow up. Patient had an episode where he was eating a roast beef sandwich this weekend when he aspirated and started choking. He was coughing so much that he passed out and his friend was able to dislodge the obstruction. He now is doing ok, no coughing, shortness of breath, fevers.   Diabetes, Type 2: -Following with Endocrinology, last seen 2/25 -Last A1c 7.6% 2/25 at [facility] -Medications: Tresiba 28 units in the morning, Trulicity increased to 1 mg weekly on Sundays by Endocrinology  -Patient is compliant with the above medications and reports no side effects.  -Eye exam: UTD  -Foot exam: Following with Podiatry  -Microalbumin: UTD 2/25 -Statin: yes -PNA vaccine: UTD -Denies symptoms of hypoglycemia, polyuria, polydipsia, numbness extremities, foot ulcers/trauma.   HLD: -Medications: Lipitor 40 mg  -Patient is compliant with above medications and reports no side effects.  -Last lipid panel: Lipid Panel     Component Value Date/Time   CHOL 106 07/02/2022 1153   TRIG 111 07/02/2022 1153   HDL 41 07/02/2022 1153   CHOLHDL 2.6 07/02/2022 1153   VLDL 22 07/02/2022 1153   LDLCALC 43 07/02/2022 1153   LDLCALC 83 09/01/2020 1015    Hypertension: -Medications: Amlodipine 10 mg, Losartan-HCTZ 100-12.5, Bisoprolol 5 mg BID  -Patient is compliant with above medications and reports no side effects. -Checking BP at home (average): No -Denies any SOB, CP, vision changes, LE edema or symptoms of hypotension -Following with Cardiology,  last seen 04/20/22 -Echo 06/13/22 55-60%  COPD: -COPD status: stable -Current medications: Breztri BID, albuterol twice daily -Satisfied with current treatment?: yes -Oxygen use: no -Dyspnea frequency: Occasionally, worse with activity  -Cough frequency: Occasional but no more than normal  -Rescue inhaler frequency: twice daily -Limitation of activity: yes -Productive cough: Yes, white thick but stable -Pneumovax: Up to Date -Influenza: Up to Date -Lung cancer screening LUNG-RADS-2 1/25 -Following with Pulmonology, last seen 12/20/22 -Had been able to quit smoking since our last visit!  GERD:  -Decreased Protonix to 20 mg daily at LOV, doing well with this change, symptoms still controlled  CKD3b: -Last GFR 2/25 34 -Following with Nephrology   Health Maintenance: -Blood work UTD -Colonoscopy 01/2018, repeat in 10 years -Lung cancer screening 1/24 - Lung-RADS2 -Discussed obtaining COVID booster and RSV vaccine   Review of Systems  Constitutional:  Negative for chills and fever.  Respiratory:  Positive for cough and shortness of breath. Negative for wheezing.   Cardiovascular:  Negative for chest pain.  Neurological:  Negative for dizziness and headaches.      Objective:    BP 122/84 (Cuff Size: Large)   Pulse 83   Temp 97.8 F (36.6 C) (Oral)   Resp 16   Ht 5\' 6"  (1.676 m)   Wt 189 lb (85.7 kg)   SpO2 96%   BMI 30.51 kg/m  BP Readings from Last 3 Encounters:  05/28/23 122/84  02/05/23 124/86  12/20/22 126/76   Wt Readings from Last 3  Encounters:  05/28/23 189 lb (85.7 kg)  02/05/23 187 lb 1.6 oz (84.9 kg)  12/20/22 187 lb (84.8 kg)      Physical Exam Constitutional:      Appearance: Normal appearance.  HENT:     Head: Normocephalic and atraumatic.  Eyes:     Conjunctiva/sclera: Conjunctivae normal.  Cardiovascular:     Rate and Rhythm: Normal rate and regular rhythm.  Pulmonary:     Effort: Pulmonary effort is normal.     Breath sounds: Normal  breath sounds. No wheezing, rhonchi or rales.     Comments: Decreased air movement throughout but no wheezes, rhonchi Skin:    General: Skin is warm and dry.     Comments: Scalp lesion image below   Neurological:     General: No focal deficit present.     Mental Status: He is alert. Mental status is at baseline.  Psychiatric:        Mood and Affect: Mood normal.        Behavior: Behavior normal.      No results found for any visits on 05/28/23.        Assessment & Plan:   Essential hypertension Assessment & Plan: Blood pressure stable here today, no changes made to medications and appropriate refills sent to pharmacy.    Orders: -     amLODIPine Besylate; Take 1 tablet (10 mg total) by mouth daily.  Dispense: 90 tablet; Refill: 1 -     Losartan Potassium-HCTZ; Take 1 tablet by mouth daily.  Dispense: 90 tablet; Refill: 1  Mixed hyperlipidemia Assessment & Plan: Stable, continue and refill statin.   Orders: -     Atorvastatin Calcium; Take 1 tablet (40 mg total) by mouth daily.  Dispense: 90 tablet; Refill: 1  Type 2 diabetes mellitus with stage 3b chronic kidney disease, with long-term current use of insulin (HCC) Assessment & Plan: Following with Endo, reviewed labs. Patient's Trulicity was increased.    CKD (chronic kidney disease) stage 4, GFR 15-29 ml/min (HCC) Assessment & Plan: GFR decreased on last set of labs, following with Nephrology.    Chronic bronchitis, unspecified chronic bronchitis type (HCC) Assessment & Plan: Stable, he was able to quit smoking, efforts congratulated. Doing well on Breztri and Albuterol.      Return in about 6 months (around 11/28/2023).  Margarita Mail, DO

## 2023-05-28 NOTE — Assessment & Plan Note (Signed)
 Stable, continue and refill statin.

## 2023-05-28 NOTE — Assessment & Plan Note (Signed)
 Blood pressure stable here today, no changes made to medications and appropriate refills sent to pharmacy.

## 2023-05-28 NOTE — Assessment & Plan Note (Signed)
 Stable, he was able to quit smoking, efforts congratulated. Doing well on Breztri and Albuterol.

## 2023-05-28 NOTE — Assessment & Plan Note (Signed)
 Following with Endo, reviewed labs. Patient's Trulicity was increased.

## 2023-06-18 ENCOUNTER — Ambulatory Visit (INDEPENDENT_AMBULATORY_CARE_PROVIDER_SITE_OTHER): Payer: Medicare HMO | Admitting: Dermatology

## 2023-06-18 DIAGNOSIS — L281 Prurigo nodularis: Secondary | ICD-10-CM

## 2023-06-18 DIAGNOSIS — D492 Neoplasm of unspecified behavior of bone, soft tissue, and skin: Secondary | ICD-10-CM | POA: Diagnosis not present

## 2023-06-18 NOTE — Progress Notes (Unsigned)
   New Patient Visit   Subjective  Zachary Wallace is a 68 y.o. male who presents for the following: place at posterior scalp ~3 months, scratches at it, at times can be sore.   The patient has spots, moles and lesions to be evaluated, some may be new or changing and the patient may have concern these could be cancer.   The following portions of the chart were reviewed this encounter and updated as appropriate: medications, allergies, medical history  Review of Systems:  No other skin or systemic complaints except as noted in HPI or Assessment and Plan.  Objective  Well appearing patient in no apparent distress; mood and affect are within normal limits.  A focused examination was performed of the following areas: Scalp  Relevant exam findings are noted in the Assessment and Plan.  Posterior scalp 9mm indurated hyperpigmented scaly papule   Assessment & Plan   NEOPLASM OF SKIN Posterior scalp Skin / nail biopsy Type of biopsy: tangential   Informed consent: discussed and consent obtained   Timeout: patient name, date of birth, surgical site, and procedure verified   Procedure prep:  Patient was prepped and draped in usual sterile fashion Prep type:  Isopropyl alcohol Anesthesia: the lesion was anesthetized in a standard fashion   Anesthetic:  1% lidocaine w/ epinephrine 1-100,000 buffered w/ 8.4% NaHCO3 Instrument used: DermaBlade   Hemostasis achieved with: pressure and aluminum chloride   Outcome: patient tolerated procedure well   Post-procedure details: sterile dressing applied and wound care instructions given   Dressing type: bandage and petrolatum   Specimen 1 - Surgical pathology Differential Diagnosis: SK vs DF vs Prurigo Nodule vs Other  Check Margins: No 9mm indurated hyperpigmented scaly papule  Return if symptoms worsen or fail to improve, for w/ Dr. Katrinka Blazing.  [name], CMA, am acting as scribe for Elie Goody, MD .   Documentation: I have  reviewed the above documentation for accuracy and completeness, and I agree with the above.  Elie Goody, MD

## 2023-06-18 NOTE — Patient Instructions (Addendum)

## 2023-06-19 ENCOUNTER — Encounter: Payer: Self-pay | Admitting: Dermatology

## 2023-06-20 LAB — SURGICAL PATHOLOGY

## 2023-06-22 ENCOUNTER — Other Ambulatory Visit: Payer: Self-pay | Admitting: Internal Medicine

## 2023-06-22 DIAGNOSIS — I10 Essential (primary) hypertension: Secondary | ICD-10-CM

## 2023-06-24 ENCOUNTER — Telehealth: Payer: Self-pay

## 2023-06-24 NOTE — Telephone Encounter (Signed)
 Patient advised pathology showed benign skin thickening from chronic irritation, no treatment needed. Butch Penny., RMA

## 2023-06-24 NOTE — Telephone Encounter (Signed)
-----   Message from [place] sent at 06/20/2023  9:02 PM EDT ----- Diagnosis: posterior scalp :       PRURIGO NODULARIS    Plan: please call to share that bump on scalp was a benign skin thickening from chronic irritation. It should resolve after being removed by the biopsy. No further treatment or follow up needed

## 2023-06-25 NOTE — Telephone Encounter (Signed)
 Requested medication (s) are due for refill today: yes   Requested medication (s) are on the active medication list: yes   Last refill:  05/28/23 #90 1 refills  Future visit scheduled: yes in 5 months   Notes to clinic:  protocol failed. Last labs 06/28/22. Do you want to refill Rx?     Requested Prescriptions  Pending Prescriptions Disp Refills   losartan-hydrochlorothiazide (HYZAAR) 100-12.5 MG tablet [Pharmacy Med Name: losartan 100 mg-hydrochlorothiazide 12.5 mg tablet] 90 tablet 1    Sig: TAKE ONE TABLET BY MOUTH ONCE DAILY     Cardiovascular: ARB + Diuretic Combos Failed - 06/25/2023 11:39 AM      Failed - K in normal range and within 180 days    Potassium  Date Value Ref Range Status  06/28/2022 3.9 3.5 - 5.3 mmol/L Final         Failed - Na in normal range and within 180 days    Sodium  Date Value Ref Range Status  06/28/2022 138 135 - 146 mmol/L Final         Failed - Cr in normal range and within 180 days    Creat  Date Value Ref Range Status  06/28/2022 2.45 (H) 0.70 - 1.35 mg/dL Final   Creatinine, Urine  Date Value Ref Range Status  05/24/2023 87.5  Final         Passed - eGFR is 10 or above and within 180 days    GFR, Est African American  Date Value Ref Range Status  02/29/2020 40 (L) > OR = 60 mL/min/1.42m2 Final   GFR, Est Non African American  Date Value Ref Range Status  02/29/2020 34 (L) > OR = 60 mL/min/1.35m2 Final   GFR, Estimated  Date Value Ref Range Status  04/20/2022 40 (L) >60 mL/min Final    Comment:    (NOTE) Calculated using the CKD-EPI Creatinine Equation (2021)    eGFR  Date Value Ref Range Status  05/24/2023 34  Final         Passed - Patient is not pregnant      Passed - Last BP in normal range    BP Readings from Last 1 Encounters:  05/28/23 122/84         Passed - Valid encounter within last 6 months    Recent Outpatient Visits           4 weeks ago Essential hypertension   [facility]  Margarita Mail, DO       Future Appointments             In 5 months Margarita Mail, DO [facility], [facility]

## 2023-06-27 ENCOUNTER — Ambulatory Visit: Admitting: Internal Medicine

## 2023-07-02 ENCOUNTER — Encounter: Payer: Self-pay | Admitting: Internal Medicine

## 2023-07-02 ENCOUNTER — Ambulatory Visit (INDEPENDENT_AMBULATORY_CARE_PROVIDER_SITE_OTHER): Admitting: Internal Medicine

## 2023-07-02 ENCOUNTER — Other Ambulatory Visit: Payer: Self-pay

## 2023-07-02 VITALS — BP 128/84 | HR 74 | Temp 97.8°F | Resp 16 | Ht 66.0 in | Wt 183.8 lb

## 2023-07-02 DIAGNOSIS — H6123 Impacted cerumen, bilateral: Secondary | ICD-10-CM | POA: Diagnosis not present

## 2023-07-02 NOTE — Progress Notes (Signed)
   Acute Office Visit  Subjective:     Patient ID: Zachary Wallace, male    DOB: 02-18-1956, 68 y.o.   MRN: [id number]  Chief Complaint  Patient presents with   Ear Fullness    Put vaseline in ear due to a sore and now ear feels clogged up    Ear Fullness  Associated symptoms include hearing loss. Pertinent negatives include no ear discharge.   Patient is in today for hearing changes after he used Vaseline in his right ear.  Discussed the use of AI scribe software for clinical note transcription with the patient, who gave verbal consent to proceed.  History of Present Illness The patient presents with discomfort in the right ear, which he attributes to overuse of Vaseline. He denies pain, but reports intermittent changes in hearing, describing it as "sometimes I can hear and sometimes I can't." He also notes a sensation of the ear "opening up" and then "closing back up," likening it to the sound of the ocean. He denies any tinnitus or otorrhea. He has no associated symptoms of fever or sinus congestion. The patient has a history of ear wax accumulation, but has not had his ears cleaned at this clinic before.    Review of Systems  Constitutional:  Negative for chills and fever.  HENT:  Positive for hearing loss. Negative for congestion, ear discharge, ear pain, sinus pain and tinnitus.         Objective:    BP 128/84 (Cuff Size: Large)   Pulse 74   Temp 97.8 F (36.6 C) (Oral)   Resp 16   Ht 5\' 6"  (1.676 m)   Wt 183 lb 12.8 oz (83.4 kg)   SpO2 94%   BMI 29.67 kg/m    Physical Exam Constitutional:      Appearance: Normal appearance.  HENT:     Head: Normocephalic and atraumatic.     Right Ear: Tympanic membrane, ear canal and external ear normal. There is impacted cerumen.     Left Ear: Tympanic membrane, ear canal and external ear normal. There is impacted cerumen.  Eyes:     Conjunctiva/sclera: Conjunctivae normal.  Skin:    General: Skin is warm and dry.   Neurological:     General: No focal deficit present.     Mental Status: He is alert. Mental status is at baseline.  Psychiatric:        Mood and Affect: Mood normal.        Behavior: Behavior normal.     No results found for any visits on 07/02/23.      Assessment & Plan:   1. Bilateral impacted cerumen (Primary): After obtaining consent from the patient, warm water and hydrogen peroxide was used to remove ear wax. Patient tolerated procedure well and could hear better after wax was removed.    Return for already scheduled.  Margarita Mail, DO

## 2023-07-24 NOTE — Progress Notes (Signed)
 Cardiology Clinic Note   Date: 07/25/2023 ID: Meshulem, Dunagan 06-15-1955, MRN [id number]  Primary Cardiologist:  Constancia Delton, MD  Chief Complaint   Zachary Wallace is a 68 y.o. male who presents to the clinic today for routine follow up.   Patient Profile   ILIAN GUIZAR is followed by Dr. Junnie Olives for the history outlined below.      Past medical history significant for: Chest pain/dyspnea. Nuclear stress test 05/03/2022: Attenuation corrected images with moderate-sized fixed defect mid to distal anterior wall, apical lateral region, unable to exclude processing artifact.  The above defect not appreciated on nonattenuation corrected images.  Normal wall motion.  No EKG changes concerning for ischemia at peak stress or in recovery.  Low risk study. Echo 06/13/2022: EF 55 to 60%.  No RWMA.  Normal diastolic parameters.  Normal RV size/function.  No significant valvular abnormalities. Hypertension. Hyperlipidemia. Lipid panel 05/24/2023: LDL 36, HDL 38, TG 115, total 97. COPD. OSA. T2DM. CKD stage IIIb. Tobacco abuse.  In summary, patient was first evaluated by Dr. Junnie Olives on 04/20/2022 for chest pain and shortness of breath.  He reported ongoing chest pain for 6 months with and without exertion as well as shortness of breath.  Family history includes father passing away with heart attack at an unknown age.  He underwent nuclear stress testing and echo for further evaluation as detailed above.  Patient was last seen in the office by Dr. Junnie Olives on 07/02/2022 to follow-up after testing.  He was doing well at that time and no changes were made.     History of Present Illness    Today, patient is doing well. Patient denies lower extremity edema, orthopnea or PND. No chest pain, pressure, or tightness. No palpitations.  Patient has stable shortness of breath most notable with yard work secondary to COPD. He is retired. He does yard work and other household chores to stay active.      ROS: All other systems reviewed and are otherwise negative except as noted in History of Present Illness.  EKGs/Labs Reviewed    EKG Interpretation Date/Time:  Thursday Jul 25 2023 14:31:31 EDT Ventricular Rate:  75 PR Interval:  204 QRS Duration:  88 QT Interval:  372 QTC Calculation: 415 R Axis:   46  Text Interpretation: Normal sinus rhythm Normal ECG When compared with ECG of 04/20/2022 No significant change Confirmed by Morey Ar [phone]) on 07/25/2023 2:41:21 PM   Physical Exam    VS:  BP 104/68 (BP Location: Left Arm, Patient Position: Sitting, Cuff Size: Normal)   Pulse 76   Ht 5\' 6"  (1.676 m)   Wt 183 lb (83 kg)   SpO2 94%   BMI 29.54 kg/m  , BMI Body mass index is 29.54 kg/m.  GEN: Well nourished, well developed, in no acute distress. Neck: No JVD or carotid bruits. Cardiac:  RRR. No murmurs. No rubs or gallops.   Respiratory:  Respirations regular and unlabored. Clear to auscultation without rales, wheezing or rhonchi. GI: Soft, nontender, nondistended. Extremities: Radials/DP/PT 2+ and equal bilaterally. No clubbing or cyanosis. No edema.  Skin: Warm and dry, no rash. Neuro: Strength intact.  Assessment & Plan   Chest pain/dyspnea Normal nuclear stress test February 2024.  Echo March 2024 showed normal LV/RV function, normal diastolic parameters, no significant valvular abnormalities.  Patient denies chest pain, pressure or tightness. He has shortness of breath related to COPD. He is active doing yard work and other household activities.  -  Continue bisoprolol , aspirin, atorvastatin .  Hypertension BP today 108/68. No report of headaches or dizziness.  - Continue amlodipine , bisoprolol , Hyzaar.  Hyperlipidemia LDL 30 02 May 2023, at goal. - Continue atorvastatin .  Disposition: Return in 1 year or sooner as needed.          Signed, Lonell Rives. Hila Bolding, DNP, NP-C

## 2023-07-25 ENCOUNTER — Encounter: Payer: Self-pay | Admitting: Student

## 2023-07-25 ENCOUNTER — Ambulatory Visit: Attending: Student | Admitting: Student

## 2023-07-25 VITALS — BP 104/68 | HR 76 | Ht 66.0 in | Wt 183.0 lb

## 2023-07-25 DIAGNOSIS — E782 Mixed hyperlipidemia: Secondary | ICD-10-CM

## 2023-07-25 DIAGNOSIS — R079 Chest pain, unspecified: Secondary | ICD-10-CM | POA: Diagnosis not present

## 2023-07-25 DIAGNOSIS — R0602 Shortness of breath: Secondary | ICD-10-CM | POA: Diagnosis not present

## 2023-07-25 DIAGNOSIS — I10 Essential (primary) hypertension: Secondary | ICD-10-CM

## 2023-07-25 NOTE — Patient Instructions (Signed)
 Medication Instructions:  Your Physician recommend you continue on your current medication as directed.    *If you need a refill on your cardiac medications before your next appointment, please call your pharmacy*   Follow-Up: At [facility], you and your health needs are our priority.  As part of our continuing mission to provide you with exceptional heart care, our providers are all part of one team.  This team includes your primary Cardiologist (physician) and Advanced Practice Providers or APPs (Physician Assistants and Nurse Practitioners) who all work together to provide you with the care you need, when you need it.  Your next appointment:   12 month(s)  Provider:   You may see Constancia Delton, MD or one of the following Advanced Practice Providers on your designated Care Team:   Laneta Pintos, NP Gildardo Labrador, PA-C Varney Gentleman, PA-C Cadence [place], PA-C Ronald Cockayne, NP Morey Ar, NP    We recommend signing up for the patient portal called "MyChart".  Sign up information is provided on this After Visit Summary.  MyChart is used to connect with patients for Virtual Visits (Telemedicine).  Patients are able to view lab/test results, encounter notes, upcoming appointments, etc.  Non-urgent messages can be sent to your provider as well.   To learn more about what you can do with MyChart, go to [email or web address].

## 2023-07-29 ENCOUNTER — Ambulatory Visit
Admission: RE | Admit: 2023-07-29 | Discharge: 2023-07-29 | Disposition: A | Discharge status: Home / Self Care | Source: Ambulatory Visit | Attending: Internal Medicine

## 2023-07-29 ENCOUNTER — Encounter: Payer: Self-pay | Admitting: Internal Medicine

## 2023-07-29 ENCOUNTER — Ambulatory Visit (INDEPENDENT_AMBULATORY_CARE_PROVIDER_SITE_OTHER): Admitting: Internal Medicine

## 2023-07-29 ENCOUNTER — Ambulatory Visit
Admission: RE | Admit: 2023-07-29 | Discharge: 2023-07-29 | Disposition: A | Discharge status: Home / Self Care | Attending: Internal Medicine | Admitting: Internal Medicine

## 2023-07-29 ENCOUNTER — Ambulatory Visit: Payer: Self-pay

## 2023-07-29 VITALS — BP 122/70 | HR 94 | Temp 98.7°F | Resp 18 | Ht 66.0 in | Wt 183.1 lb

## 2023-07-29 DIAGNOSIS — J441 Chronic obstructive pulmonary disease with (acute) exacerbation: Secondary | ICD-10-CM | POA: Diagnosis present

## 2023-07-29 DIAGNOSIS — R051 Acute cough: Secondary | ICD-10-CM

## 2023-07-29 DIAGNOSIS — R0602 Shortness of breath: Secondary | ICD-10-CM | POA: Insufficient documentation

## 2023-07-29 LAB — POC COVID19/FLU A&B COMBO
Covid Antigen, POC: NEGATIVE
Influenza A Antigen, POC: NEGATIVE
Influenza B Antigen, POC: NEGATIVE

## 2023-07-29 MED ORDER — IPRATROPIUM-ALBUTEROL 0.5-2.5 (3) MG/3ML IN SOLN: 3.0000 mL | Freq: Once | RESPIRATORY_TRACT | Status: AC

## 2023-07-29 MED ORDER — PREDNISONE 20 MG PO TABS: 40.0000 mg | ORAL_TABLET | Freq: Every day | ORAL | 0 refills | Status: AC

## 2023-07-29 MED ORDER — DOXYCYCLINE HYCLATE 100 MG PO TABS: 100.0000 mg | ORAL_TABLET | Freq: Two times a day (BID) | ORAL | 0 refills | Status: AC

## 2023-07-29 MED ORDER — BENZONATATE 100 MG PO CAPS: 100.0000 mg | ORAL_CAPSULE | Freq: Two times a day (BID) | ORAL | 0 refills | Status: AC | PRN

## 2023-07-29 NOTE — Telephone Encounter (Signed)
 Copied from CRM [id number]. Topic: Clinical - Red Word Triage >> Jul 29, 2023  7:43 AM Rosamond Comes wrote: Red Word that prompted transfer to Nurse Triage: patient calling in, patient has asthma, cough is deep in chest. coughing in interfering with asthma, hard time breathing.  Chief Complaint: cough, wheezing Symptoms: cough with white sputum Frequency: since Wednesday Pertinent Negatives: Patient denies cp, fever Disposition: [] ED /[] Urgent Care (no appt availability in office) / [x] Appointment(In office/virtual)/ []  [facility]/ [] Home Care/ [] Refused Recommended Disposition /[] [place] [facility]/ []  Follow-up with PCP Additional Notes: per protocol apt made for today; care advice given, denies questions; instructed to go to ER if becomes worse.   Reason for Disposition  [1] MILD difficulty breathing (e.g., minimal/no SOB at rest, SOB with walking, pulse <100) AND [2] still present when not coughing  Answer Assessment - Initial Assessment Questions 1. ONSET: "When did the cough begin?"      wednesday 2. SEVERITY: "How bad is the cough today?"      severe 3. SPUTUM: "Describe the color of your sputum" (none, dry cough; clear, white, yellow, green)     white 4. HEMOPTYSIS: "Are you coughing up any blood?" If so ask: "How much?" (flecks, streaks, tablespoons, etc.)     no 5. DIFFICULTY BREATHING: "Are you having difficulty breathing?" If Yes, ask: "How bad is it?" (e.g., mild, moderate, severe)    - MILD: No SOB at rest, mild SOB with walking, speaks normally in sentences, can lie down, no retractions, pulse < 100.    - MODERATE: SOB at rest, SOB with minimal exertion and prefers to sit, cannot lie down flat, speaks in phrases, mild retractions, audible wheezing, pulse 100-120.    - SEVERE: Very SOB at rest, speaks in single words, struggling to breathe, sitting hunched forward, retractions, pulse > 120      At night and in am, severe 6. FEVER: "Do you have a fever?" If Yes, ask:  "What is your temperature, how was it measured, and when did it start?"     no 7. CARDIAC HISTORY: "Do you have any history of heart disease?" (e.g., heart attack, congestive heart failure)      no 8. LUNG HISTORY: "Do you have any history of lung disease?"  (e.g., pulmonary embolus, asthma, emphysema)     asthma 9. PE RISK FACTORS: "Do you have a history of blood clots?" (or: recent major surgery, recent prolonged travel, bedridden)     no 10. OTHER SYMPTOMS: "Do you have any other symptoms?" (e.g., runny nose, wheezing, chest pain)       Runny nose and congestion, wheezing 11. PREGNANCY: "Is there any chance you are pregnant?" "When was your last menstrual period?"       no 12. TRAVEL: "Have you traveled out of the country in the last month?" (e.g., travel history, exposures)       No  Protocols used: Cough - Acute Productive-A-AH

## 2023-07-29 NOTE — Progress Notes (Signed)
 Acute Office Visit  Subjective:     Patient ID: Zachary Wallace, male    DOB: [birth year], 68 y.o.   MRN: [id number]  Chief Complaint  Patient presents with   Cough    Onset 6 days sob, cough and congestion    Cough Associated symptoms include a sore throat, shortness of breath and wheezing. Pertinent negatives include no chills or ear pain.   Patient is in today for cough, SOB and congestion.   Discussed the use of AI scribe software for clinical note transcription with the patient, who gave verbal consent to proceed.  History of Present Illness Zachary Wallace is a 68 year old male with COPD who presents with worsening respiratory symptoms.  He experiences worsening cough, shortness of breath, and congestion since Wednesday. The cough is productive with brownish sputum, though he has difficulty expectorating it fully. No hemoptysis is present. He denies fever. Significant wheezing occurs, especially when lying down, along with nasal congestion at night. He has a sore throat but no ear pain or pressure.  He uses Breztri  and albuterol  inhalers, with albuterol  used sparingly. Nyquil taken last night loosened congestion but did not significantly improve symptoms. His current medications include Breztri  and albuterol  inhalers. He has previously been prescribed doxycycline for COPD exacerbations.    Review of Systems  Constitutional:  Negative for chills.  HENT:  Positive for congestion and sore throat. Negative for ear pain.   Respiratory:  Positive for cough, sputum production, shortness of breath and wheezing.         Objective:    BP 122/70   Pulse 94   Temp 98.7 F (37.1 C)   Resp 18   Ht 5\' 6"  (1.676 m)   Wt 183 lb 1.6 oz (83.1 kg)   SpO2 (!) 89%   BMI 29.55 kg/m  BP Readings from Last 3 Encounters:  07/29/23 122/70  07/25/23 104/68  07/02/23 128/84   Wt Readings from Last 3 Encounters:  07/29/23 183 lb 1.6 oz (83.1 kg)  07/25/23 183 lb (83 kg)  07/02/23 183 lb  12.8 oz (83.4 kg)      Physical Exam Constitutional:      Appearance: Normal appearance.  HENT:     Head: Normocephalic and atraumatic.     Right Ear: Tympanic membrane, ear canal and external ear normal.     Left Ear: Tympanic membrane, ear canal and external ear normal.     Nose: Congestion present.     Mouth/Throat:     Mouth: Mucous membranes are moist.     Comments: Sinus drainage present Eyes:     Conjunctiva/sclera: Conjunctivae normal.  Cardiovascular:     Rate and Rhythm: Normal rate and regular rhythm.  Pulmonary:     Effort: Pulmonary effort is normal.     Comments: Decreased air sounds throughout  Skin:    General: Skin is warm and dry.  Neurological:     General: No focal deficit present.     Mental Status: He is alert. Mental status is at baseline.  Psychiatric:        Mood and Affect: Mood normal.        Behavior: Behavior normal.     No results found for any visits on 07/29/23.      Assessment & Plan:   Assessment & Plan COPD exacerbation Acute exacerbation with cough, dyspnea, wheezing, and congestion. Oxygen saturation at 89%. Treating with steroids and antibiotics. Differential includes infection or environmental triggers. Chest x-ray  to rule out pneumonia. - Tested for flu and COVID in the office, negative.  - Order chest x-ray. - Prescribe prednisone  40 mg daily for 5 days. - Prescribe doxycycline. - Administer bronchodilator treatment in office. - Continue Breztri  and albuterol  inhalers.  Sinus congestion Sinus congestion with nasal congestion and sore throat. Advised against pseudoephedrine due to hypertension risk. - Recommend over-the-counter Coricidin. - Prescribe antitussive medication.  - predniSONE  (DELTASONE ) 20 MG tablet; Take 2 tablets (40 mg total) by mouth daily for 5 days.  Dispense: 10 tablet; Refill: 0 - doxycycline (VIBRA-TABS) 100 MG tablet; Take 1 tablet (100 mg total) by mouth 2 (two) times daily for 7 days.  Dispense: 14  tablet; Refill: 0 - benzonatate  (TESSALON ) 100 MG capsule; Take 1 capsule (100 mg total) by mouth 2 (two) times daily as needed for cough.  Dispense: 20 capsule; Refill: 0 - DG Chest 2 View; Future - ipratropium-albuterol  (DUONEB) 0.5-2.5 (3) MG/3ML nebulizer solution 3 mL   Return if symptoms worsen or fail to improve.  Rockney Cid, DO

## 2023-09-10 ENCOUNTER — Other Ambulatory Visit: Payer: Self-pay | Admitting: Internal Medicine

## 2023-09-10 DIAGNOSIS — R109 Unspecified abdominal pain: Secondary | ICD-10-CM

## 2023-09-12 NOTE — Telephone Encounter (Signed)
 Requested Prescriptions  Pending Prescriptions Disp Refills   dicyclomine  (BENTYL ) 10 MG capsule [Pharmacy Med Name: dicyclomine  10 mg capsule] 270 capsule 0    Sig: TAKE ONE CAPSULE BY MOUTH THREE TIMES DAILY AS NEEDED FOR MUSCLE SPASMS     Gastroenterology:  Antispasmodic Agents Passed - 09/12/2023 11:52 AM      Passed - Valid encounter within last 12 months    Recent Outpatient Visits           1 month ago COPD exacerbation [facility])   [facility] Rockney Cid, DO   2 months ago Bilateral impacted cerumen   [facility] Rockney Cid, DO   3 months ago Essential hypertension   [facility] Rockney Cid, DO       Future Appointments             In 2 months Rockney Cid, DO [facility], [facility]

## 2023-09-29 ENCOUNTER — Other Ambulatory Visit: Payer: Self-pay | Admitting: Internal Medicine

## 2023-09-29 DIAGNOSIS — J42 Unspecified chronic bronchitis: Secondary | ICD-10-CM

## 2023-10-01 NOTE — Telephone Encounter (Signed)
 Requested Prescriptions  Pending Prescriptions Disp Refills   albuterol  (VENTOLIN  HFA) 108 (90 Base) MCG/ACT inhaler [Pharmacy Med Name: albuterol  sulfate HFA 90 mcg/actuation aerosol inhaler] 18 g 3    Sig: INHALE 1 TO 2 PUFFS BY MOUTH EVERY 4 HOURS AS NEEDED FOR WHEEZING OR FOR SHORTNESS OF BREATH     Pulmonology:  Beta Agonists 2 Passed - 10/01/2023  2:47 PM      Passed - Last BP in normal range    BP Readings from Last 1 Encounters:  07/29/23 122/70         Passed - Last Heart Rate in normal range    Pulse Readings from Last 1 Encounters:  07/29/23 94         Passed - Valid encounter within last 12 months    Recent Outpatient Visits           2 months ago COPD exacerbation [facility])   [facility] Bernardo Fend, DO   3 months ago Bilateral impacted cerumen   [facility] Bernardo Fend, DO   4 months ago Essential hypertension   [facility] Bernardo Fend, DO       Future Appointments             In 1 month Bernardo Fend, DO [facility], [facility]

## 2023-10-11 ENCOUNTER — Encounter: Payer: Self-pay | Admitting: Internal Medicine

## 2023-10-11 ENCOUNTER — Ambulatory Visit: Payer: Self-pay

## 2023-10-11 ENCOUNTER — Ambulatory Visit (INDEPENDENT_AMBULATORY_CARE_PROVIDER_SITE_OTHER): Admitting: Internal Medicine

## 2023-10-11 VITALS — BP 138/72 | HR 83 | Temp 98.6°F | Resp 18 | Ht 66.0 in | Wt 185.0 lb

## 2023-10-11 DIAGNOSIS — J441 Chronic obstructive pulmonary disease with (acute) exacerbation: Secondary | ICD-10-CM | POA: Diagnosis not present

## 2023-10-11 MED ORDER — DOXYCYCLINE HYCLATE 100 MG PO TABS
100.0000 mg | ORAL_TABLET | Freq: Two times a day (BID) | ORAL | 0 refills | Status: AC
Start: 2023-10-11 — End: 2023-10-18

## 2023-10-11 MED ORDER — PREDNISONE 20 MG PO TABS: 40.0000 mg | ORAL_TABLET | Freq: Every day | ORAL | 0 refills | Status: AC

## 2023-10-11 NOTE — Progress Notes (Signed)
 Acute Office Visit  Subjective:     Patient ID: Zachary Wallace, male    DOB: [birth year], 68 y.o.   MRN: [id number]  Chief Complaint  Patient presents with   Shortness of Breath    W/ wheezing and chest tightness x 2 weeks    Shortness of Breath Associated symptoms include wheezing. Pertinent negatives include no fever or sputum production.   Patient is in today for shortness of breath and wheezing x 2 weeks with chest tightness.   Discussed the use of AI scribe software for clinical note transcription with the patient, who gave verbal consent to proceed.  History of Present Illness Zachary Wallace is a 69 year old male with COPD who presents with shortness of breath and chest tightness.  He experiences increased shortness of breath and chest tightness, worsened by humidity and hot showers. Symptoms are more severe when lying down at night. He uses Trelegy as his inhaler and finds relief with nebulizer treatments twice daily, though symptoms return after the effects wear off. Albuterol  is used every four to six hours as needed. He denies fever and dark or green sputum but produces a small amount of white sputum daily. No hemoptysis is present. Previous treatments with steroids and antibiotics, particularly Prednisone , have been effective.   Review of Systems  Constitutional:  Negative for chills and fever.  Respiratory:  Positive for cough, shortness of breath and wheezing. Negative for sputum production.         Objective:    BP 138/72   Pulse 83   Temp 98.6 F (37 C)   Resp 18   Ht 5' 6 (1.676 m)   Wt 185 lb (83.9 kg)   SpO2 94%   BMI 29.86 kg/m    Physical Exam Constitutional:      Appearance: Normal appearance.  HENT:     Head: Normocephalic and atraumatic.  Eyes:     Conjunctiva/sclera: Conjunctivae normal.  Cardiovascular:     Rate and Rhythm: Normal rate and regular rhythm.  Pulmonary:     Effort: Pulmonary effort is normal.     Breath sounds: Wheezing  present. No rhonchi or rales.     Comments: Expiratory wheezes throughout  Chest:     Chest wall: Tenderness present.  Skin:    General: Skin is warm and dry.  Neurological:     General: No focal deficit present.     Mental Status: He is alert. Mental status is at baseline.  Psychiatric:        Mood and Affect: Mood normal.        Behavior: Behavior normal.     No results found for any visits on 10/11/23.      Assessment & Plan:   Assessment & Plan Chronic Obstructive Pulmonary Disease (COPD) exacerbation Experiencing exacerbation with nocturnal dyspnea, wheezing, and chest tightness, likely due to humidity and allergies. No fever, expectorating white sputum. Examination showed wheezing and restricted air movement, no pneumonia. High-dose prednisone  and antibiotics recommended to prevent pneumonia. - Prescribe prednisone  40 mg for 5 days. - Prescribe an antibiotic to prevent pneumonia. - Continue Breztri  inhaler. - Use albuterol  nebulizer every 4 to 6 hours PRN. - Advise staying indoors in air conditioning to avoid humidity. - Instruct to pick up medications from the pharmacy.  Follow-up Scheduled for follow-up in September, advised to return sooner if symptoms worsen to prevent severe exacerbation. - Advise returning to the clinic if symptoms worsen before the scheduled September appointment.  -  predniSONE  (DELTASONE ) 20 MG tablet; Take 2 tablets (40 mg total) by mouth daily for 5 days.  Dispense: 10 tablet; Refill: 0 - doxycycline  (VIBRA -TABS) 100 MG tablet; Take 1 tablet (100 mg total) by mouth 2 (two) times daily for 7 days.  Dispense: 14 tablet; Refill: 0   Return for already scheduled.  Zachary Fischer, DO

## 2023-10-11 NOTE — Telephone Encounter (Signed)
    FYI Only or Action Required?: Action required by provider: request for appointment.  Patient was last seen in primary care on 07/29/2023 by Bernardo Fend, DO.  Called Nurse Triage reporting Breathing Problem.  Symptoms began a week ago.  Interventions attempted: Prescription medications: Albuterol .  Symptoms are: gradually worsening. History of asthma. Last week started having SOB, wheezing, chest tightness,using Albuterol .  Triage Disposition: See Physician Within 24 Hours  Patient/caregiver understands and will follow disposition?: YesCopied from CRM [id number]. Topic: Clinical - Red Word Triage >> Oct 11, 2023  7:40 AM Silvana PARAS wrote: Red Word that prompted transfer to Nurse Triage: SOB, tightness of chest due to asthma. Warm transfer to nurse. Reason for Disposition  [1] MILD asthma attack (e.g., no SOB at rest, mild SOB with walking, speaks normally in sentences, mild wheezing) AND [2] lasting > 24 hours on prescribed treatment  Answer Assessment - Initial Assessment Questions 1. RESPIRATORY STATUS: Describe your breathing? (e.g., wheezing, shortness of breath, unable to speak, severe coughing)      SOB 2. ONSET: When did this asthma attack begin?      Last week 3. TRIGGER: What do you think triggered this attack? (e.g., URI, exposure to pollen or other allergen, tobacco smoke)      N/a 4. PEAK EXPIRATORY FLOW RATE (PEFR): Do you use a peak flow meter? If Yes, ask: What's the current peak flow? What's your personal best peak flow?      no 5. SEVERITY: How bad is this attack?      moderate 6. ASTHMA MEDICINES:  What treatments have you tried?      yes 7. INHALED QUICK-RELIEF TREATMENTS FOR THIS ATTACK: What treatments have you given yourself so far? and How many and how often? If using an inhaler, ask, How many puffs? Note: Routine treatments are 2 puffs every 4 hours as needed. Rescue treatments are 4 puffs repeated every 20 minutes, up to three times  as needed.      yes 8. OTHER SYMPTOMS: Do you have any other symptoms? (e.g., chest pain, coughing up yellow sputum, fever, runny nose)     wheezing 9. O2 SATURATION MONITOR:  Do you use an oxygen saturation monitor (pulse oximeter) at home? If Yes, What is your reading (oxygen level) today? What is your usual oxygen saturation reading? (e.g., 95%)     no 10. PREGNANCY: Is there any chance you are pregnant? When was your last menstrual period?       N/a  Protocols used: Asthma Attack-A-AH

## 2023-10-31 ENCOUNTER — Other Ambulatory Visit: Payer: Self-pay | Admitting: Internal Medicine

## 2023-10-31 DIAGNOSIS — K219 Gastro-esophageal reflux disease without esophagitis: Secondary | ICD-10-CM

## 2023-11-02 NOTE — Telephone Encounter (Signed)
 Requested Prescriptions  Pending Prescriptions Disp Refills   pantoprazole  (PROTONIX ) 20 MG tablet [Pharmacy Med Name: pantoprazole  20 mg tablet,delayed release] 90 tablet 1    Sig: TAKE ONE TABLET BY MOUTH ONCE DAILY     Gastroenterology: Proton Pump Inhibitors Passed - 11/02/2023  8:37 PM      Passed - Valid encounter within last 12 months    Recent Outpatient Visits           3 weeks ago COPD exacerbation [facility])   [place] [facility] Bernardo Fend, DO   3 months ago COPD exacerbation [facility])   [facility] Bernardo Fend, DO   4 months ago Bilateral impacted cerumen   [facility] Bernardo Fend, DO   5 months ago Essential hypertension   [facility] Bernardo Fend, DO       Future Appointments             In 3 weeks Bernardo Fend, DO [facility], [facility]

## 2023-11-04 ENCOUNTER — Other Ambulatory Visit: Payer: Self-pay | Admitting: Internal Medicine

## 2023-11-04 DIAGNOSIS — I10 Essential (primary) hypertension: Secondary | ICD-10-CM

## 2023-11-07 ENCOUNTER — Ambulatory Visit: Payer: Self-pay

## 2023-11-07 ENCOUNTER — Ambulatory Visit (INDEPENDENT_AMBULATORY_CARE_PROVIDER_SITE_OTHER): Admitting: Nurse Practitioner

## 2023-11-07 ENCOUNTER — Encounter: Payer: Self-pay | Admitting: Nurse Practitioner

## 2023-11-07 VITALS — BP 120/80 | HR 84 | Temp 97.9°F | Resp 18 | Ht 66.0 in | Wt 184.0 lb

## 2023-11-07 DIAGNOSIS — J441 Chronic obstructive pulmonary disease with (acute) exacerbation: Secondary | ICD-10-CM | POA: Diagnosis not present

## 2023-11-07 MED ORDER — PREDNISONE 20 MG PO TABS: 40.0000 mg | ORAL_TABLET | Freq: Every day | ORAL | 0 refills | Status: AC

## 2023-11-07 MED ORDER — AMOXICILLIN-POT CLAVULANATE 875-125 MG PO TABS: 1.0000 | ORAL_TABLET | Freq: Two times a day (BID) | ORAL | 0 refills | Status: AC

## 2023-11-07 NOTE — Telephone Encounter (Signed)
 Requested Prescriptions  Pending Prescriptions Disp Refills   bisoprolol  (ZEBETA ) 5 MG tablet [Pharmacy Med Name: bisoprolol  fumarate 5 mg tablet] 60 tablet 2    Sig: TAKE ONE TABLET BY MOUTH TWICE DAILY     Cardiovascular: Beta Blockers 2 Failed - 11/07/2023 11:09 AM      Failed - Cr in normal range and within 360 days    Creat  Date Value Ref Range Status  06/28/2022 2.45 (H) 0.70 - 1.35 mg/dL Final   Creatinine, Urine  Date Value Ref Range Status  05/24/2023 87.5  Final         Passed - Last BP in normal range    BP Readings from Last 1 Encounters:  11/07/23 120/80         Passed - Last Heart Rate in normal range    Pulse Readings from Last 1 Encounters:  11/07/23 84         Passed - Valid encounter within last 6 months    Recent Outpatient Visits           Today COPD exacerbation [facility])   [facility] Gareth Mliss FALCON, FNP   3 weeks ago COPD exacerbation [facility])   [facility] Bernardo Fend, DO   3 months ago COPD exacerbation [facility])   [facility] Bernardo Fend, DO   4 months ago Bilateral impacted cerumen   [facility] Bernardo Fend, DO   5 months ago Essential hypertension   [facility] Bernardo Fend, DO       Future Appointments             In 3 weeks Bernardo Fend, DO [facility], [facility]

## 2023-11-07 NOTE — Telephone Encounter (Signed)
 FYI Only or Action Required?: FYI only for provider.  Patient was last seen in primary care on 10/11/2023 by Bernardo Fend, DO.  Called Nurse Triage reporting Shortness of Breath.  Symptoms began a week ago.  Interventions attempted: Rest, hydration, or home remedies and Other: using maintenance inhaler and rescue inhaler.  Symptoms are: unchanged.  Triage Disposition: See HCP Within 4 Hours (Or PCP Triage)  Patient/caregiver understands and will follow disposition?: Yes  Copied from CRM #[id number]. Topic: Clinical - Red Word Triage >> Nov 07, 2023  8:50 AM Pinkey ORN wrote: Red Word that prompted transfer to Nurse Triage: Asthma >> Nov 07, 2023  8:51 AM Pinkey ORN wrote: Patient states he's been experiencing his asthma flare up since last week. Patient also states his right side has been bothering him for about 2 weeks now.  Reason for Disposition  [1] MILD difficulty breathing (e.g., minimal/no SOB at rest, SOB with walking, pulse < 100) AND [2] NEW-onset or WORSE than normal  Answer Assessment - Initial Assessment Questions 1. RESPIRATORY STATUS: Describe your breathing? (e.g., wheezing, shortness of breath, unable to speak, severe coughing)      Shortness of breath, wheezing 2. ONSET: When did this breathing problem begin?      Started last week 3. PATTERN Does the difficult breathing come and go, or has it been constant since it started?      constant 4. SEVERITY: How bad is your breathing? (e.g., mild, moderate, severe)      mild 5. RECURRENT SYMPTOM: Have you had difficulty breathing before? If Yes, ask: When was the last time? and What happened that time?      Yes-last month 6. CARDIAC HISTORY: Do you have any history of heart disease? (e.g., heart attack, angina, bypass surgery, angioplasty)      no 7. LUNG HISTORY: Do you have any history of lung disease?  (e.g., pulmonary embolus, asthma, emphysema)     Asthma, COPD 8. CAUSE: What do you  think is causing the breathing problem?      Possible asthma flare up 9. OTHER SYMPTOMS: Do you have any other symptoms? (e.g., chest pain, cough, dizziness, fever, runny nose)     Chest tightness, cough, runny nose 10. O2 SATURATION MONITOR:  Do you use an oxygen saturation monitor (pulse oximeter) at home? If Yes, ask: What is your reading (oxygen level) today? What is your usual oxygen saturation reading? (e.g., 95%)       Patient doesn't have a oxygen saturation monitor.  12. TRAVEL: Have you traveled out of the country in the last month? (e.g., travel history, exposures)       No  Patient is using maintenance inhaler along with rescue inhaler.  Protocols used: Breathing Difficulty-A-AH

## 2023-11-07 NOTE — Progress Notes (Signed)
 BP 120/80   Pulse 84   Temp 97.9 F (36.6 C)   Resp 18   Ht 5' 6 (1.676 m)   Wt 184 lb (83.5 kg)   SpO2 93%   BMI 29.70 kg/m    Subjective:    Patient ID: Zachary Wallace, male    DOB: Dec 17, 1955, 68 y.o.   MRN: [id number]  HPI: Zachary Wallace is a 68 y.o. male  Chief Complaint  Patient presents with   Shortness of Breath    Onset last Monday w/ cough and upper right side back pain    Discussed the use of AI scribe software for clinical note transcription with the patient, who gave verbal consent to proceed.  History of Present Illness Zachary Wallace is a 68 year old male with asthma and COPD who presents with shortness of breath.  Dyspnea and wheezing - Shortness of breath and wheezing present for the past week - Shortness of breath worsens with walking long distances, limiting daily activities - No recent contact with sick individuals - No fever  Cough and associated pain - Persistent cough for the past week - Back pain associated with coughing - Taking Tylenol  for back pain  Asthma and copd management - Current medications include Breztri , two puffs twice daily, and albuterol  inhaler as rescue medication - Has DuoNubs available for nebulizer machine with adequate supply of medication - Previous COPD exacerbation on July 18th, 2025, treated with prednisone  - Tolerates steroids well - No known allergies         05/28/2023    2:21 PM 02/05/2023    8:43 AM 12/13/2022   10:46 AM  Depression screen PHQ 2/9  Decreased Interest 0 0 0  Down, Depressed, Hopeless 0 0 0  PHQ - 2 Score 0 0 0  Altered sleeping  0   Tired, decreased energy  0   Change in appetite  0   Feeling bad or failure about yourself   0   Trouble concentrating  0   Moving slowly or fidgety/restless  0   Suicidal thoughts  0   PHQ-9 Score  0   Difficult doing work/chores  Not difficult at all     Relevant past medical, surgical, family and social history reviewed and updated as indicated.  Interim medical history since our last visit reviewed. Allergies and medications reviewed and updated.  Review of Systems  Ten systems reviewed and is negative except as mentioned in HPI      Objective:     BP 120/80   Pulse 84   Temp 97.9 F (36.6 C)   Resp 18   Ht 5' 6 (1.676 m)   Wt 184 lb (83.5 kg)   SpO2 93%   BMI 29.70 kg/m    Wt Readings from Last 3 Encounters:  11/07/23 184 lb (83.5 kg)  10/11/23 185 lb (83.9 kg)  07/29/23 183 lb 1.6 oz (83.1 kg)    Physical Exam Physical Exam VITALS: SaO2- 93% GENERAL: Alert, cooperative, well developed, no acute distress. HEENT: Normocephalic, normal oropharynx, moist mucous membranes. CHEST: Wheezing present, no rhonchi or rales CARDIOVASCULAR: Normal heart rate and rhythm, S1 and S2 normal without murmurs. ABDOMEN: Soft, non-tender, non-distended, without organomegaly, normal bowel sounds. EXTREMITIES: No cyanosis or edema. NEUROLOGICAL: Cranial nerves grossly intact, moves all extremities without gross motor or sensory deficit.           Assessment & Plan:   Problem List Items Addressed This Visit   None  Visit Diagnoses       COPD exacerbation (HCC)    -  Primary   Relevant Medications   predniSONE  (DELTASONE ) 20 MG tablet   amoxicillin -clavulanate (AUGMENTIN ) 875-125 MG tablet        Assessment and Plan Assessment & Plan COPD exacerbation with asthma COPD exacerbation with asthma, presenting with shortness of breath and wheezing over the past week. Oxygen saturation is 93%. No signs of infection or fever, but wheezing indicates airway constriction. Current medications include Breztri  and albuterol  inhaler, with DuoNubs available for nebulizer use. Previous exacerbation on October 11, 2023. Steroid treatment is well-tolerated. - Prescribe prednisone  40 mg daily for 5 days -Augmentin  for 5 days - Continue Breztri  and albuterol  inhaler as needed - Utilize DuoNubs with nebulizer as needed - Send prescriptions  to [facility] - Instruct to call by Monday if not improved to consider chest x-ray - Expect improvement within 24 to 48 hours  Acute back pain due to coughing Acute back pain likely secondary to muscle strain from coughing. Tylenol  is being taken for pain relief. Steroids expected to help with inflammation and pain relief. - Continue taking Tylenol  for pain relief - Expect steroids to aid in reducing inflammation and pain        Follow up plan: Return if symptoms worsen or fail to improve.

## 2023-11-11 ENCOUNTER — Other Ambulatory Visit: Payer: Self-pay | Admitting: Nurse Practitioner

## 2023-11-11 ENCOUNTER — Telehealth: Payer: Self-pay

## 2023-11-11 DIAGNOSIS — J441 Chronic obstructive pulmonary disease with (acute) exacerbation: Secondary | ICD-10-CM

## 2023-11-11 NOTE — Telephone Encounter (Signed)
 Copied from CRM #[id number]. Topic: Clinical - Medical Advice >> Nov 11, 2023  1:53 PM Emylou G wrote: Reason for CRM: Patient called.. said still has pain in his side.SABRA and wheezing w/asthma..  was told would get possible xray.. pls call him.

## 2023-11-11 NOTE — Telephone Encounter (Signed)
 Can you put in me a order for cxr per Dr.Andrews note. Patient will come over tomorrow

## 2023-11-12 ENCOUNTER — Ambulatory Visit
Admission: RE | Admit: 2023-11-12 | Discharge: 2023-11-12 | Disposition: A | Discharge status: Home / Self Care | Source: Ambulatory Visit | Attending: Nurse Practitioner | Admitting: Nurse Practitioner

## 2023-11-12 ENCOUNTER — Ambulatory Visit: Payer: Self-pay | Admitting: Nurse Practitioner

## 2023-11-12 ENCOUNTER — Ambulatory Visit
Admission: RE | Admit: 2023-11-12 | Discharge: 2023-11-12 | Disposition: A | Discharge status: Home / Self Care | Attending: Nurse Practitioner | Admitting: Nurse Practitioner

## 2023-11-12 DIAGNOSIS — J441 Chronic obstructive pulmonary disease with (acute) exacerbation: Secondary | ICD-10-CM

## 2023-11-12 DIAGNOSIS — R059 Cough, unspecified: Secondary | ICD-10-CM | POA: Diagnosis not present

## 2023-11-12 DIAGNOSIS — J439 Emphysema, unspecified: Secondary | ICD-10-CM | POA: Diagnosis not present

## 2023-11-12 DIAGNOSIS — R062 Wheezing: Secondary | ICD-10-CM | POA: Diagnosis not present

## 2023-11-22 DIAGNOSIS — Z794 Long term (current) use of insulin: Secondary | ICD-10-CM | POA: Diagnosis not present

## 2023-11-22 DIAGNOSIS — E785 Hyperlipidemia, unspecified: Secondary | ICD-10-CM | POA: Diagnosis not present

## 2023-11-22 DIAGNOSIS — E1169 Type 2 diabetes mellitus with other specified complication: Secondary | ICD-10-CM | POA: Diagnosis not present

## 2023-11-22 DIAGNOSIS — E1165 Type 2 diabetes mellitus with hyperglycemia: Secondary | ICD-10-CM | POA: Diagnosis not present

## 2023-11-22 DIAGNOSIS — Z1331 Encounter for screening for depression: Secondary | ICD-10-CM | POA: Diagnosis not present

## 2023-11-22 DIAGNOSIS — E1159 Type 2 diabetes mellitus with other circulatory complications: Secondary | ICD-10-CM | POA: Diagnosis not present

## 2023-11-22 DIAGNOSIS — I152 Hypertension secondary to endocrine disorders: Secondary | ICD-10-CM | POA: Diagnosis not present

## 2023-11-22 DIAGNOSIS — E559 Vitamin D deficiency, unspecified: Secondary | ICD-10-CM | POA: Diagnosis not present

## 2023-11-28 ENCOUNTER — Encounter: Payer: Self-pay | Admitting: Internal Medicine

## 2023-11-28 ENCOUNTER — Other Ambulatory Visit: Payer: Self-pay

## 2023-11-28 ENCOUNTER — Ambulatory Visit: Payer: Self-pay | Admitting: Pulmonary Disease

## 2023-11-28 ENCOUNTER — Ambulatory Visit (INDEPENDENT_AMBULATORY_CARE_PROVIDER_SITE_OTHER): Admitting: Internal Medicine

## 2023-11-28 VITALS — BP 130/72 | HR 73 | Temp 97.9°F | Resp 16 | Ht 66.0 in | Wt 181.5 lb

## 2023-11-28 DIAGNOSIS — I10 Essential (primary) hypertension: Secondary | ICD-10-CM

## 2023-11-28 DIAGNOSIS — Z23 Encounter for immunization: Secondary | ICD-10-CM | POA: Diagnosis not present

## 2023-11-28 DIAGNOSIS — Z794 Long term (current) use of insulin: Secondary | ICD-10-CM

## 2023-11-28 DIAGNOSIS — E782 Mixed hyperlipidemia: Secondary | ICD-10-CM

## 2023-11-28 DIAGNOSIS — E1122 Type 2 diabetes mellitus with diabetic chronic kidney disease: Secondary | ICD-10-CM | POA: Diagnosis not present

## 2023-11-28 DIAGNOSIS — N1832 Chronic kidney disease, stage 3b: Secondary | ICD-10-CM | POA: Diagnosis not present

## 2023-11-28 DIAGNOSIS — J441 Chronic obstructive pulmonary disease with (acute) exacerbation: Secondary | ICD-10-CM

## 2023-11-28 LAB — POCT GLYCOSYLATED HEMOGLOBIN (HGB A1C): Hemoglobin A1C: 7.4 % — AB (ref 4.0–5.6)

## 2023-11-28 MED ORDER — LOSARTAN POTASSIUM-HCTZ 100-12.5 MG PO TABS: 1.0000 | ORAL_TABLET | Freq: Every day | ORAL | 1 refills | Status: AC

## 2023-11-28 MED ORDER — BREZTRI AEROSPHERE 160-9-4.8 MCG/ACT IN AERO: 2.0000 | INHALATION_SPRAY | Freq: Two times a day (BID) | RESPIRATORY_TRACT | 11 refills | Status: AC

## 2023-11-28 MED ORDER — ATORVASTATIN CALCIUM 40 MG PO TABS: 40.0000 mg | ORAL_TABLET | Freq: Every day | ORAL | 1 refills | Status: AC

## 2023-11-28 MED ORDER — PREDNISONE 20 MG PO TABS: 40.0000 mg | ORAL_TABLET | Freq: Every day | ORAL | 0 refills | Status: DC

## 2023-11-28 MED ORDER — AMLODIPINE BESYLATE 10 MG PO TABS: 10.0000 mg | ORAL_TABLET | Freq: Every day | ORAL | 1 refills | Status: AC

## 2023-11-28 NOTE — Telephone Encounter (Signed)
 Noted. NFN

## 2023-11-28 NOTE — Telephone Encounter (Signed)
 FYI Only or Action Required?: FYI only for provider.  Patient is followed in Pulmonology for COPD, last seen on 12/20/2022 by Malka Domino, MD.  Called Nurse Triage reporting Shortness of Breath.  Symptoms began several weeks ago.  Interventions attempted: Other: Seen by PCP today and prescribed prednisone .  Symptoms are: gradually worsening but has not started prednisone  yet.  Triage Disposition: See PCP Within 2 Weeks  Patient/caregiver understands and will follow disposition?: Yes        Copied from CRM [id number]. Topic: Clinical - Red Word Triage >> Nov 28, 2023 11:49 AM Nathanel DEL wrote: Red Word that prompted transfer to Nurse Triage: SOB/ wheezing/tightness in chest About 3 wks an getting worse         Reason for Disposition  [1] MILD longstanding difficulty breathing (e.g., minimal/no SOB at rest, SOB with walking, pulse < 100) AND [2] SAME as normal  Answer Assessment - Initial Assessment Questions Increased shortness of breath, seen at PCP today, prescribed prednisone  and advised to follow up with PULM, RN triage. Earliest appointment patient is available for was scheduled for 9/15. Patient instructed to call back for new or worsening symptoms. Patient verbalized understanding and agreement with this plan.          1. RESPIRATORY STATUS: Describe your breathing? (e.g., wheezing, shortness of breath, unable to speak, severe coughing)      Increased shortness of breath  2. ONSET: When did this breathing problem begin?      3 weeks  3. PATTERN Does the difficult breathing come and go, or has it been constant since it started?      Intermittent  4. SEVERITY: How bad is your breathing? (e.g., mild, moderate, severe)      Mild 5. RECURRENT SYMPTOM: Have you had difficulty breathing before? If Yes, ask: When was the last time? and What happened that time?      Yes, history of COPD 6. CARDIAC HISTORY: Do you have any history of heart  disease? (e.g., heart attack, angina, bypass surgery, angioplasty)      Hypertension  7. LUNG HISTORY: Do you have any history of lung disease?  (e.g., pulmonary embolus, asthma, emphysema)     Yes 8. CAUSE: What do you think is causing the breathing problem?      History of COPD 9. OTHER SYMPTOMS: Do you have any other symptoms? (e.g., chest pain, cough, dizziness, fever, runny nose)     Wheezing, tightness in chest with shortness of breath  10. O2 SATURATION MONITOR:  Do you use an oxygen saturation monitor (pulse oximeter) at home? If Yes, ask: What is your reading (oxygen level) today? What is your usual oxygen saturation reading? (e.g., 95%)       Does not check  Protocols used: Breathing Difficulty-A-AH

## 2023-11-28 NOTE — Progress Notes (Signed)
 Established Office Visit  Subjective:     Patient ID: Zachary Wallace, male    DOB: [birth year], 68 y.o.   MRN: [id number]  Chief Complaint  Patient presents with   Medical Management of Chronic Issues    6 month recheck    Cough Associated symptoms include shortness of breath. Pertinent negatives include no chest pain, chills, fever, headaches or wheezing.   Patient is in today for follow up. He has been more short of breath at home recently, more so with activity.   Discussed the use of AI scribe software for clinical note transcription with the patient, who gave verbal consent to proceed.  History of Present Illness DAROLD MILEY is a 68 year old male with COPD and diabetes who presents with shortness of breath.  He experiences significant shortness of breath, with a notable episode last night where he felt he might not make it out of the shower due to the severity. He has a sensation of tightness and his oxygen levels at home have shown variability. Symptoms worsen with activity, such as when he was outside doing work, requiring him to stop and rest.  He is currently on [place] at 26 units for diabetes management and has recently increased his Trulicity to 3 mg, although he has not yet started the new dose. His A1c is 7.4.  For COPD, he is taking Breztri  and reports doing well with it, although he is running low on refills. He uses albuterol , both as a nebulizer and an inhaler, and has been using the nebulizer more frequently. He inquires about a dose of prednisone  for five days due to his difficulty in performing daily activities, such as walking to the mailbox, which he attributes to worsening COPD symptoms.   Diabetes, Type 2: -Following with Endocrinology  -Last A1c 7.6% 6/25 -Medications: Tresiba decreased 26 units in the morning, Trulicity increased 3 mg weekly on Sundays by Endocrinology  -Patient is compliant with the above medications and reports no side effects.  -Eye  exam: UTD  -Foot exam: Following with Podiatry  -Microalbumin: UTD 2/25 -Statin: yes -PNA vaccine: UTD -Denies symptoms of hypoglycemia, polyuria, polydipsia, numbness extremities, foot ulcers/trauma.   HLD: -Medications: Lipitor 40 mg  -Patient is compliant with above medications and reports no side effects.  -Last lipid panel: Lipid Panel     Component Value Date/Time   CHOL 106 07/02/2022 1153   TRIG 111 07/02/2022 1153   HDL 41 07/02/2022 1153   CHOLHDL 2.6 07/02/2022 1153   VLDL 22 07/02/2022 1153   LDLCALC 43 07/02/2022 1153   LDLCALC 83 09/01/2020 1015    Hypertension: -Medications: Amlodipine  10 mg, Losartan -HCTZ 100-12.5, Bisoprolol  5 mg BID  -Patient is compliant with above medications and reports no side effects. -Checking BP at home (average): No -Denies any SOB, CP, vision changes, LE edema or symptoms of hypotension -Following with Cardiology, last seen 04/20/22 -Echo 06/13/22 55-60%  COPD: -COPD status: worse -Current medications: Breztri  BID, albuterol  twice daily -Satisfied with current treatment?: yes -Oxygen use: no -Dyspnea frequency: Increasing, worse with activity - has symptoms today -Cough frequency: Occasional but no more than normal  -Rescue inhaler frequency: twice daily -Limitation of activity: yes -Productive cough: Yes, white thick but stable -Pneumovax: Up to Date -Influenza: due today -Lung cancer screening LUNG-RADS-2 1/25 -Following with Pulmonology, last seen 12/20/22 -Had been able to quit smoking since our last visit!  CKD3b: -Last GFR 6/25 42 -Following with Nephrology   Health Maintenance: -Blood work  UTD - due next time -Colonoscopy 01/2018, repeat in 10 years -Lung cancer screening 1/24 - Lung-RADS2 -Flu vaccine today   Review of Systems  Constitutional:  Negative for chills and fever.  Respiratory:  Positive for cough and shortness of breath. Negative for wheezing.   Cardiovascular:  Negative for chest pain.   Neurological:  Negative for dizziness and headaches.      Objective:    BP 130/72 (Cuff Size: Large)   Pulse 73   Temp 97.9 F (36.6 C) (Oral)   Resp 16   Ht 5' 6 (1.676 m)   Wt 181 lb 8 oz (82.3 kg)   SpO2 96%   BMI 29.29 kg/m  BP Readings from Last 3 Encounters:  11/28/23 130/72  11/07/23 120/80  10/11/23 138/72   Wt Readings from Last 3 Encounters:  11/28/23 181 lb 8 oz (82.3 kg)  11/07/23 184 lb (83.5 kg)  10/11/23 185 lb (83.9 kg)      Physical Exam Constitutional:      Appearance: Normal appearance.  HENT:     Head: Normocephalic and atraumatic.  Eyes:     Conjunctiva/sclera: Conjunctivae normal.  Cardiovascular:     Rate and Rhythm: Normal rate and regular rhythm.  Pulmonary:     Effort: Pulmonary effort is normal.     Breath sounds: Normal breath sounds. No wheezing, rhonchi or rales.     Comments: Decreased air movement throughout but no wheezes, rhonchi Skin:    General: Skin is warm and dry.     Comments: Scalp lesion image below   Neurological:     General: No focal deficit present.     Mental Status: He is alert. Mental status is at baseline.  Psychiatric:        Mood and Affect: Mood normal.        Behavior: Behavior normal.      Results for orders placed or performed in visit on 11/28/23  POCT HgB A1C  Result Value Ref Range   Hemoglobin A1C 7.4 (A) 4.0 - 5.6 %   HbA1c POC (<> result, manual entry)     HbA1c, POC (prediabetic range)     HbA1c, POC (controlled diabetic range)            Assessment & Plan:   Assessment & Plan Chronic obstructive pulmonary disease (COPD) w/ worsening SOB Increased dyspnea during activities suggests potential worsening of COPD. Oxygen saturation at rest 92%, decreased to 90% but maintained with clinic walk test.  Has not seen pulmonologist in over a year. - Contact pulmonologist for yearly follow up.  - Prescribe prednisone  for five days. - Refill Breztri  and albuterol  inhaler  prescriptions.  Type 2 diabetes mellitus with diabetic chronic kidney disease Diabetes well-controlled with A1c of 7.4%. Managed with [place] and Trulicity. Endocrinologist oversees diabetes-related care. - Continue Tresiba and Trulicity as prescribed. - Schedule follow-up in three months for lab work, including fasting cholesterol check.  Essential hypertension Blood pressure well-controlled at 130/72 mmHg. On beta blocker and another antihypertensive. - Refill blood pressure medications and send to [facility].  Mixed hyperlipidemia Due for cholesterol labs. - Order cholesterol labs for next visit. - Refill Lipitor prescription and send to [facility].  - losartan -hydrochlorothiazide (HYZAAR) 100-12.5 MG tablet; Take 1 tablet by mouth daily.  Dispense: 90 tablet; Refill: 1 - amLODipine  (NORVASC ) 10 MG tablet; Take 1 tablet (10 mg total) by mouth daily.  Dispense: 90 tablet; Refill: 1 - atorvastatin  (LIPITOR) 40 MG tablet; Take  1 tablet (40 mg total) by mouth daily.  Dispense: 90 tablet; Refill: 1 - POCT HgB A1C - budesonide-glycopyrrolate -formoterol  (BREZTRI  AEROSPHERE) 160-9-4.8 MCG/ACT AERO inhaler; Inhale 2 puffs into the lungs 2 (two) times daily.  Dispense: 10.7 g; Refill: 11 - predniSONE  (DELTASONE ) 20 MG tablet; Take 2 tablets (40 mg total) by mouth daily for 5 days.  Dispense: 10 tablet; Refill: 0 - Flu vaccine HIGH DOSE PF(Fluzone Trivalent)   Return in about 3 months (around 02/27/2024).  Sharyle Fischer, DO

## 2023-12-09 ENCOUNTER — Ambulatory Visit: Admitting: Pulmonary Disease

## 2023-12-09 ENCOUNTER — Encounter: Payer: Self-pay | Admitting: Pulmonary Disease

## 2023-12-09 ENCOUNTER — Other Ambulatory Visit (HOSPITAL_COMMUNITY): Payer: Self-pay

## 2023-12-09 ENCOUNTER — Telehealth: Payer: Self-pay

## 2023-12-09 ENCOUNTER — Ambulatory Visit
Admission: RE | Admit: 2023-12-09 | Discharge: 2023-12-09 | Disposition: A | Discharge status: Home / Self Care | Source: Ambulatory Visit | Attending: Pulmonary Disease | Admitting: Pulmonary Disease

## 2023-12-09 VITALS — BP 128/78 | HR 78 | Temp 97.3°F | Ht 66.0 in | Wt 180.2 lb

## 2023-12-09 DIAGNOSIS — R0602 Shortness of breath: Secondary | ICD-10-CM | POA: Insufficient documentation

## 2023-12-09 DIAGNOSIS — J449 Chronic obstructive pulmonary disease, unspecified: Secondary | ICD-10-CM | POA: Diagnosis not present

## 2023-12-09 DIAGNOSIS — Z87891 Personal history of nicotine dependence: Secondary | ICD-10-CM | POA: Diagnosis not present

## 2023-12-09 NOTE — Progress Notes (Signed)
 Synopsis: Referred in  by Bernardo Fend, DO   Subjective:   PATIENT ID: Ubaldo MARLA Primus GLORI: male DOB: [birth year], MRN: [id number]  Chief Complaint  Patient presents with   COPD    SOB. No wheezing. Cough with white sputum.     HPI Mr. Cellucci is a pleasant 68 year old male patient with a past medical history of COPD stage IV gp B on triple inhaler therapy previously following with Dr. Darlean presenting today to the pulmonary clinic for a follow up visit.   Last seen Dr. Darlean in March 2024 presenting today for a follow up visit. Reports shortness of breath mostly on exertion associated with cough sometimes dry and sometimes productive. Unable to go uphill without feeling shortness of breath. CAT in clinic 20.   He is enrolled in the low dose CT scan program with recent CT chest lung rads 2. pending repeat CT in January 2025. It showed emphysema.   FH - Denies any family history of pulmonary diseases.   Social History - Quit smoking last year. Smoked 1/2 ppd for years.    OV 12/09/2023 Mr. Sullenger is here to follow-up on his COPD.  He has stage IV COPD with high mMRC.  For the last 4 weeks he has been having shortness of breath on exertion and with minimal daily activities.  Discussed with him that this is likely due to his underlying advanced COPD.  I doubt that this is an acute exacerbation or pneumonia.  I have discussed with him the importance of pulmonary rehab which he is agreeable.  His peripheral eosinophil count was 300 and therefore he would qualify for Dupixent.  I will put in a referral to start Dupixent.  ROS All systems were reviewed and are negative except for the above.  Objective:   Vitals:   12/09/23 1055  BP: 128/78  Pulse: 78  Temp: (!) 97.3 F (36.3 C)  SpO2: 95%  Weight: 180 lb 3.2 oz (81.7 kg)  Height: 5' 6 (1.676 m)   95% on RA BMI Readings from Last 3 Encounters:  12/09/23 29.09 kg/m  11/28/23 29.29 kg/m  11/07/23 29.70 kg/m   Wt Readings  from Last 3 Encounters:  12/09/23 180 lb 3.2 oz (81.7 kg)  11/28/23 181 lb 8 oz (82.3 kg)  11/07/23 184 lb (83.5 kg)    Physical Exam GEN: NAD HEENT: Supple Neck, Reactive Pupils, EOMI  CVS: Normal S1, Normal S2, RRR, No murmurs or ES appreciated  Lungs: Clear bilateral air entry.  Abdomen: Soft, non tender, non distended, + BS  Extremities: Warm and well perfused, No edema  Skin: No suspicious lesions appreciated  Psych: Normal Affect  Ancillary Information   CBC    Component Value Date/Time   WBC 6.0 02/23/2021 0920   RBC 5.23 02/23/2021 0920   HGB 15.5 02/23/2021 0920   HCT 46.9 02/23/2021 0920   PLT 193 02/23/2021 0920   MCV 89.7 02/23/2021 0920   MCH 29.6 02/23/2021 0920   MCHC 33.0 02/23/2021 0920   RDW 12.3 02/23/2021 0920   LYMPHSABS 1,554 02/23/2021 0920   EOSABS 300 02/23/2021 0920   BASOSABS 30 02/23/2021 0920    Imaging  LDCT 03/2022  Central airways are patent. Mild centrilobular emphysema. Mild bilateral bronchial wall thickening, unchanged when compared with the prior exam. No consolidation, pleural effusion or pneumothorax. Previously seen small solid pulmonary nodules have resolved. New tiny solid nodule of the left lower lobe measuring 2.2 mm on image 231.  Latest Ref Rng & Units 08/17/2021    9:55 AM  PFT Results  FVC-Pre L 2.35   FVC-Predicted Pre % 61   FVC-Post L 2.33   FVC-Predicted Post % 61   Pre FEV1/FVC % % 38   Post FEV1/FCV % % 41   FEV1-Pre L 0.88   FEV1-Predicted Pre % 30   FEV1-Post L 0.95   DLCO uncorrected ml/min/mmHg 11.19   DLCO UNC% % 42   DLVA Predicted % 50   TLC L 5.75   TLC % Predicted % 84   RV % Predicted % 113      Assessment & Plan:  Mr. Duerst is a pleasant 68 year old male patient with a past medical history of COPD stage IV gp B on triple inhaler therapy previously following with Dr. Darlean presenting today to the pulmonary clinic for a follow up visit.   #COPD stage III-IV gp B CAT 20  Peripheral  eosinophil count 300 []  c/w Budesonide-Formoterol -Glycopyrrolate  [Breztri ] 2 puffs BID.  []  c/w Albuterol  2puffs Q6H as needed  []  6 min walk test.  []  Referral for Pulmonary Rehab placed.  []  Start Dupixent  #Tobacco use  Remains Abstinent from smoking.  Enrolled in LDCT next CT chest on 03/2024.    RTC 3 months  I spent 30 minutes caring for this patient today, including preparing to see the patient, obtaining a medical history , reviewing a separately obtained history, performing a medically appropriate examination and/or evaluation, counseling and educating the patient/family/caregiver, ordering medications, tests, or procedures, documenting clinical information in the electronic health record, and independently interpreting results (not separately reported/billed) and communicating results to the patient/family/caregiver  Darrin Barn, MD [facility] 12/09/2023 11:13 AM

## 2023-12-09 NOTE — Telephone Encounter (Signed)
 Received notification from [employer] regarding a prior authorization for DUPIXENT. Authorization has been APPROVED from 03/27/23 to 03/25/24. Approval letter sent to scan center.  Per test claim, copay for 28 days supply is $0  Patient can fill through [facility]: [phone]   Authorization # [id number] Phone # [phone]

## 2023-12-09 NOTE — Telephone Encounter (Signed)
 Submitted a Prior Authorization request to [employer] for DUPIXENT via CoverMyMeds. Will update once we receive a response.  Key: [id number]

## 2023-12-09 NOTE — Telephone Encounter (Signed)
 Called patient to coordinate new start Dupixent scheduling. He declines to drive to [place]. We discussed potentially doing a video visit with pharmacy team to demonstrate use of demo device. He reports past experience with injection use, including use of insulin and Trulicity.   Will message provider to see if video appointment for visual demo is acceptable.   Aleck Puls, PharmD, BCPS Clinical Pharmacist  [facility]

## 2023-12-09 NOTE — Telephone Encounter (Signed)
 Patient was seen in the office today. Dr. Larinda Buttery has ordered Dupixent for the patient.  He has signed the form and it has been faxed to the pharmacy team for completion.

## 2023-12-10 NOTE — Telephone Encounter (Signed)
 See second telephone encounter from 9/15.  Nothing further needed.

## 2023-12-18 ENCOUNTER — Other Ambulatory Visit: Payer: Self-pay | Admitting: Internal Medicine

## 2023-12-18 DIAGNOSIS — J441 Chronic obstructive pulmonary disease with (acute) exacerbation: Secondary | ICD-10-CM

## 2023-12-19 ENCOUNTER — Ambulatory Visit: Payer: Self-pay

## 2023-12-19 DIAGNOSIS — Z0000 Encounter for general adult medical examination without abnormal findings: Secondary | ICD-10-CM

## 2023-12-19 NOTE — Patient Instructions (Addendum)
 Mr. Juncaj,  Thank you for taking the time for your Medicare Wellness Visit. I appreciate your continued commitment to your health goals. Please review the care plan we discussed, and feel free to reach out if I can assist you further.  Medicare recommends these wellness visits once per year to help you and your care team stay ahead of potential health issues. These visits are designed to focus on prevention, allowing your provider to concentrate on managing your acute and chronic conditions during your regular appointments.  Please note that Annual Wellness Visits do not include a physical exam. Some assessments may be limited, especially if the visit was conducted virtually. If needed, we may recommend a separate in-person follow-up with your provider.  Ongoing Care Seeing your primary care provider every 3 to 6 months helps us  monitor your health and provide consistent, personalized care.   Referrals If a referral was made during today's visit and you haven't received any updates within two weeks, please contact the referred provider directly to check on the status.  Recommended Screenings:  Health Maintenance  Topic Date Due   Zoster (Shingles) Vaccine (2 of 2) 01/27/2018   Yearly kidney health urinalysis for diabetes  08/15/2018   Complete foot exam   07/23/2023   COVID-19 Vaccine (2 - 2025-26 season) 11/25/2023   Eye exam for diabetics  01/07/2024   Screening for Lung Cancer  04/22/2024   Yearly kidney function blood test for diabetes  05/23/2024   Hemoglobin A1C  05/27/2024   Medicare Annual Wellness Visit  12/18/2024   Colon Cancer Screening  02/05/2028   DTaP/Tdap/Td vaccine (3 - Td or Tdap) 09/26/2031   Pneumococcal Vaccine for age over 59  Completed   Flu Shot  Completed   Hepatitis C Screening  Completed   HPV Vaccine  Aged Out   Meningitis B Vaccine  Aged Out     Advance Care Planning is important because it: Ensures you receive medical care that aligns with your  values, goals, and preferences. Provides guidance to your family and loved ones, reducing the emotional burden of decision-making during critical moments.  Vision: Annual vision screenings are recommended for early detection of glaucoma, cataracts, and diabetic retinopathy. These exams can also reveal signs of chronic conditions such as diabetes and high blood pressure.  Dental: Annual dental screenings help detect early signs of oral cancer, gum disease, and other conditions linked to overall health, including heart disease and diabetes.  Please see the attached documents for additional preventive care recommendations.   NEXT AWV 12/24/24 @ 10:10 AM BY PHONE

## 2023-12-19 NOTE — Telephone Encounter (Unsigned)
 Copied from CRM #[id number]. Topic: Clinical - Medication Refill >> Dec 19, 2023 12:20 PM Kendralyn S wrote: Medication: prednisone   Has the patient contacted their pharmacy? Yes (Agent: If no, request that the patient contact the pharmacy for the refill. If patient does not wish to contact the pharmacy document the reason why and proceed with request.) (Agent: If yes, when and what did the pharmacy advise?)  This is the patient's preferred pharmacy:  [facility] - [place], [place] - [address] [place] [address] Phone: [phone] Fax: [phone]  Is this the correct pharmacy for this prescription? Yes If no, delete pharmacy and type the correct one.   Has the prescription been filled recently? No  Is the patient out of the medication? Yes  Has the patient been seen for an appointment in the last year OR does the patient have an upcoming appointment? Yes  Can we respond through MyChart? No  Agent: Please be advised that Rx refills may take up to 3 business days. We ask that you follow-up with your pharmacy.

## 2023-12-19 NOTE — Progress Notes (Signed)
 Subjective:   Zachary Wallace is a 68 y.o. who presents for a Medicare Wellness preventive visit.  As a reminder, Annual Wellness Visits don't include a physical exam, and some assessments may be limited, especially if this visit is performed virtually. We may recommend an in-person follow-up visit with your provider if needed.  Visit Complete: Virtual I connected with  Zachary Wallace on 12/19/23 by a audio enabled telemedicine application and verified that I am speaking with the correct person using two identifiers.  Patient Location: Home  Provider Location: Home Office  I discussed the limitations of evaluation and management by telemedicine. The patient expressed understanding and agreed to proceed.  Vital Signs: Because this visit was a virtual/telehealth visit, some criteria may be missing or patient reported. Any vitals not documented were not able to be obtained and vitals that have been documented are patient reported.  VideoDeclined- This patient declined Librarian, academic. Therefore the visit was completed with audio only.  Persons Participating in Visit: Patient.  AWV Questionnaire: No: Patient Medicare AWV questionnaire was not completed prior to this visit.  Cardiac Risk Factors include: advanced age (>27men, >53 women);dyslipidemia;hypertension;male gender;diabetes mellitus;sedentary lifestyle     Objective:    There were no vitals filed for this visit. There is no height or weight on file to calculate BMI.     12/19/2023   10:17 AM 12/13/2022   10:48 AM 02/04/2018   12:24 PM 10/05/2016    2:21 PM 05/16/2016    8:02 AM 11/14/2015    8:53 AM 08/16/2015    8:22 AM  Advanced Directives  Does Patient Have a Medical Advance Directive? No Yes No  No  No  No  No   Type of Furniture conservator/restorer;Living will       Would patient like information on creating a medical advance directive? No - Patient declined     No - patient  declined information       Data saved with a previous flowsheet row definition    Current Medications (verified) Outpatient Encounter Medications as of 12/19/2023  Medication Sig   albuterol  (PROVENTIL ) (2.5 MG/3ML) 0.083% nebulizer solution USE ONE ampule via NEBULIZER EVERY 6 HOURS AS NEEDED SHORTNESS OF BREATH OR wheezing   albuterol  (VENTOLIN  HFA) 108 (90 Base) MCG/ACT inhaler INHALE 1 TO 2 PUFFS BY MOUTH EVERY 4 HOURS AS NEEDED FOR WHEEZING OR FOR SHORTNESS OF BREATH   amLODipine  (NORVASC ) 10 MG tablet Take 1 tablet (10 mg total) by mouth daily.   aspirin 81 MG tablet Take 81 mg by mouth.   atorvastatin  (LIPITOR) 40 MG tablet Take 1 tablet (40 mg total) by mouth daily.   B-D UF III MINI PEN NEEDLES 31G X 5 MM MISC    benzonatate  (TESSALON ) 100 MG capsule Take 1 capsule (100 mg total) by mouth 2 (two) times daily as needed for cough.   bisoprolol  (ZEBETA ) 5 MG tablet TAKE ONE TABLET BY MOUTH TWICE DAILY   budesonide-glycopyrrolate -formoterol  (BREZTRI  AEROSPHERE) 160-9-4.8 MCG/ACT AERO inhaler Inhale 2 puffs into the lungs 2 (two) times daily.   dicyclomine  (BENTYL ) 10 MG capsule TAKE ONE CAPSULE BY MOUTH THREE TIMES DAILY AS NEEDED FOR MUSCLE SPASMS   Dulaglutide 0.75 MG/0.5ML SOPN Inject 3 mg into the skin once a week.   glucose blood (ONETOUCH VERIO) test strip    insulin degludec (TRESIBA) 100 UNIT/ML FlexTouch Pen Inject 26 Units into the skin daily.   losartan -hydrochlorothiazide (HYZAAR) 100-12.5 MG  tablet Take 1 tablet by mouth daily.   pantoprazole  (PROTONIX ) 20 MG tablet TAKE ONE TABLET BY MOUTH ONCE DAILY   sildenafil  (VIAGRA ) 100 MG tablet Take 1 tablet (100 mg total) by mouth as needed for erectile dysfunction.   Facility-Administered Encounter Medications as of 12/19/2023  Medication   ipratropium-albuterol  (DUONEB) 0.5-2.5 (3) MG/3ML nebulizer solution 3 mL    Allergies (verified) Patient has no known allergies.   History: Past Medical History:  Diagnosis Date    COPD (chronic obstructive pulmonary disease) (HCC)    Diabetes mellitus without complication (HCC)    Hyperlipidemia    Hypertension    OSA (obstructive sleep apnea)    Past Surgical History:  Procedure Laterality Date   COLONOSCOPY WITH PROPOFOL  N/A 02/04/2018   Procedure: COLONOSCOPY WITH PROPOFOL ;  Surgeon: Unk Corinn Skiff, MD;  Location: [facility] END[facility]COPY;  Service: Gastroenterology;  Laterality: N/A;   HAND SURGERY Left    KNEE SURGERY Left    Family History  Problem Relation Age of Onset   Clotting disorder Mother    Diabetes Mother    Hypertension Mother    Heart attack Father    Hypertension Sister    Hypertension Brother    Vision loss Brother    Social History   Socioeconomic History   Marital status: Married    Spouse name: Not on file   Number of children: Not on file   Years of education: Not on file   Highest education level: Not on file  Occupational History   Occupation: retires custodian [employer]  Tobacco Use   Smoking status: Former    Current packs/day: 0.00    Average packs/day: 1 pack/day for 41.0 years (41.0 ttl pk-yrs)    Types: Cigarettes    Start date: 03/13/1981    Quit date: 03/13/2022    Years since quitting: 1.7    Passive exposure: Past   Smokeless tobacco: Never   Tobacco comments:    Smoked last on 12/11.  Trying to quit.   Using patches.  HFB  03/13/2022  Vaping Use   Vaping status: Never Used  Substance and Sexual Activity   Alcohol use: No    Alcohol/week: 0.0 standard drinks of alcohol   Drug use: No   Sexual activity: Yes    Partners: Male, Male  Other Topics Concern   Not on file  Social History Narrative   Not on file   Social Drivers of Health   Financial Resource Strain: Low Risk  (12/19/2023)   Overall Financial Resource Strain (CARDIA)    Difficulty of Paying Living Expenses: Not hard at all  Food Insecurity: No Food Insecurity (12/19/2023)   Hunger Vital Sign    Worried About Running Out of  Food in the Last Year: Never true    Ran Out of Food in the Last Year: Never true  Transportation Needs: No Transportation Needs (12/19/2023)   PRAPARE - Administrator, Civil Service (Medical): No    Lack of Transportation (Non-Medical): No  Physical Activity: Insufficiently Active (12/19/2023)   Exercise Vital Sign    Days of Exercise per Week: 2 days    Minutes of Exercise per Session: 30 min  Stress: No Stress Concern Present (12/19/2023)   [employer] of Occupational Health - Occupational Stress Questionnaire    Feeling of Stress: Not at all  Social Connections: Moderately Isolated (12/19/2023)   Social Connection and Isolation Panel    Frequency of Communication with Friends and Family: Once  a week    Frequency of Social Gatherings with Friends and Family: Once a week    Attends Religious Services: More than 4 times per year    Active Member of [employer] or Organizations: No    Attends Banker Meetings: Never    Marital Status: Married    Tobacco Counseling Counseling given: Not Answered Tobacco comments: Smoked last on 12/11.  Trying to quit.   Using patches.  HFB  03/13/2022    Clinical Intake:  Pre-visit preparation completed: Yes  Pain : No/denies pain     BMI - recorded: 29.1 Nutritional Status: BMI 25 -29 Overweight Nutritional Risks: None Diabetes: Yes CBG done?: No Did pt. bring in CBG monitor from home?: No  Lab Results  Component Value Date   HGBA1C 7.4 (A) 11/28/2023   HGBA1C 7.6 05/24/2023   HGBA1C 6.9 11/23/2022     How often do you need to have someone help you when you read instructions, pamphlets, or other written materials from your doctor or pharmacy?: 1 - Never  Interpreter Needed?: No  Information entered by :: JHONNIE DAS, LPN   Activities of Daily Living    12/19/2023   10:18 AM 02/05/2023    8:43 AM  In your present state of health, do you have any difficulty performing the following activities:   Hearing? 0 0  Vision? 0 0  Difficulty concentrating or making decisions? 0 0  Walking or climbing stairs? 1 0  Comment SHOB   Dressing or bathing? 0 0  Doing errands, shopping? 0 0  Preparing Food and eating ? N   Using the Toilet? N   In the past six months, have you accidently leaked urine? N   Do you have problems with loss of bowel control? N   Managing your Medications? N   Managing your Finances? N   Housekeeping or managing your Housekeeping? N     Patient Care Team: Bernardo Fend, DO as PCP - General (Internal Medicine) Darliss Rogue, MD as PCP - Cardiology (Cardiology) Drema Aspen, NP as Nurse Practitioner (Family Medicine) Tina Damien DEL, MD as Referring Physician (Nephrology) Cherilyn Debby CROME, MD as Consulting Physician (Endocrinology) [facility] [place])  I have updated your Care Teams any recent Medical Services you may have received from other providers in the past year.     Assessment:   This is a routine wellness examination for Zachary Wallace.  Hearing/Vision screen Hearing Screening - Comments:: NO AIDS Vision Screening - Comments:: READERS- [facility]   Goals Addressed             This Visit's Progress    DIET - EAT MORE FRUITS AND VEGETABLES         Depression Screen     12/19/2023   10:15 AM 05/28/2023    2:21 PM 02/05/2023    8:43 AM 12/13/2022   10:46 AM 11/02/2022    9:05 AM 06/28/2022    8:49 AM 05/24/2022    2:57 PM  PHQ 2/9 Scores  PHQ - 2 Score 0 0 0 0 3 0 0  PHQ- 9 Score 0  0  3 0     Fall Risk     12/19/2023   10:18 AM 11/28/2023   10:01 AM 05/28/2023    2:21 PM 02/05/2023    8:42 AM 12/13/2022   10:43 AM  Fall Risk   Falls in the past year? 0 0 0 0 0  Number falls in past yr: 0 0 0 0 0  Injury with Fall? 0 0 0 0 0  Risk for fall due to : No Fall Risks No Fall Risks No Fall Risks  No Fall Risks  Follow up Falls evaluation completed;Falls prevention discussed Falls  evaluation completed Falls evaluation completed  Education provided;Falls prevention discussed    MEDICARE RISK AT HOME:  Medicare Risk at Home Any stairs in or around the home?: Yes If so, are there any without handrails?: No Home free of loose throw rugs in walkways, pet beds, electrical cords, etc?: Yes Adequate lighting in your home to reduce risk of falls?: Yes Life alert?: No Use of a cane, walker or w/c?: No Grab bars in the bathroom?: No Shower chair or bench in shower?: No Elevated toilet seat or a handicapped toilet?: No  TIMED UP AND GO:  Was the test performed?  No  Cognitive Function: 6CIT completed        12/19/2023   10:19 AM 12/13/2022   10:49 AM  6CIT Screen  What Year? 0 points 0 points  What month? 0 points 0 points  What time? 0 points 0 points  Count back from 20 0 points 0 points  Months in reverse 4 points 2 points  Repeat phrase 0 points 4 points  Total Score 4 points 6 points    Immunizations Immunization History  Administered Date(s) Administered   Fluad Quad(high Dose 65+) 12/26/2021   INFLUENZA, HIGH DOSE SEASONAL PF 11/28/2023   Influenza,inj,Quad PF,6+ Mos 12/02/2014, 12/02/2017   Influenza,inj,quad, With Preservative 01/05/2019   Influenza-Unspecified 12/30/2019, 11/24/2020, 11/25/2022   Moderna Sars-Covid-2 Vaccination 03/08/2020   PNEUMOCOCCAL CONJUGATE-20 09/01/2020   Pneumococcal Conjugate-13 06/30/2013, 12/02/2014   Pneumococcal Polysaccharide-23 02/27/2008   Tdap 02/03/2010, 09/25/2021   Zoster Recombinant(Shingrix ) 12/02/2017    Screening Tests Health Maintenance  Topic Date Due   Zoster Vaccines- Shingrix  (2 of 2) 01/27/2018   Diabetic kidney evaluation - Urine ACR  08/15/2018   FOOT EXAM  07/23/2023   COVID-19 Vaccine (2 - 2025-26 season) 11/25/2023   OPHTHALMOLOGY EXAM  01/07/2024   Lung Cancer Screening  04/22/2024   Diabetic kidney evaluation - eGFR measurement  05/23/2024   HEMOGLOBIN A1C  05/27/2024   Medicare  Annual Wellness (AWV)  12/18/2024   Colonoscopy  02/05/2028   DTaP/Tdap/Td (3 - Td or Tdap) 09/26/2031   Pneumococcal Vaccine: 50+ Years  Completed   Influenza Vaccine  Completed   Hepatitis C Screening  Completed   HPV VACCINES  Aged Out   Meningococcal B Vaccine  Aged Out    Health Maintenance Items Addressed: NEEDS 2ND SHINGRIX , COVID--UP TO DATE ON COLONOSCOPY   Additional Screening:  Vision Screening: Recommended annual ophthalmology exams for early detection of glaucoma and other disorders of the eye. Is the patient up to date with their annual eye exam?  Yes  Who is the provider or what is the name of the office in which the patient attends annual eye exams? [facility]  Dental Screening: Recommended annual dental exams for proper oral hygiene  Community Resource Referral / Chronic Care Management: CRR required this visit?  No   CCM required this visit?  No   Plan:    I have personally reviewed and noted the following in the patient's chart:   Medical and social history Use of alcohol, tobacco or illicit drugs  Current medications and supplements including opioid prescriptions. Patient is not currently taking opioid prescriptions. Functional ability and status Nutritional status  Physical activity Advanced directives List of other physicians Hospitalizations, surgeries, and ER visits in previous 12 months Vitals Screenings to include cognitive, depression, and falls Referrals and appointments  In addition, I have reviewed and discussed with patient certain preventive protocols, quality metrics, and best practice recommendations. A written personalized care plan for preventive services as well as general preventive health recommendations were provided to patient.   Jhonnie GORMAN Das, LPN   [id number]   After Visit Summary: (MyChart) Due to this being a telephonic visit, the after visit summary with patients personalized plan was offered to patient via MyChart    Notes: Nothing significant to report at this time.

## 2023-12-19 NOTE — Telephone Encounter (Signed)
 Requested medications are due for refill today.  unsure  Requested medications are on the active medications list.  no  Last refill. 11/28/2023 #10 0 rf  Future visit scheduled.   yes  Notes to clinic.  Refill not delegated.    Requested Prescriptions  Pending Prescriptions Disp Refills   predniSONE  (DELTASONE ) 20 MG tablet [Pharmacy Med Name: prednisone  20 mg tablet] 10 tablet 0    Sig: TAKE TWO TABLETS BY MOUTH ONCE DAILY X5 DAYS     Not Delegated - Endocrinology:  Oral Corticosteroids Failed - 12/19/2023  4:59 PM      Failed - This refill cannot be delegated      Failed - Manual Review: Eye exam for IOP if prolonged treatment      Failed - Glucose (serum) in normal range and within 180 days    Glucose, Bld  Date Value Ref Range Status  06/28/2022 149 (H) 65 - 99 mg/dL Final    Comment:    .            Fasting reference interval . For someone without known diabetes, a glucose value >125 mg/dL indicates that they may have diabetes and this should be confirmed with a follow-up test. .    POC Glucose  Date Value Ref Range Status  01/10/2015 174 (A) 70 - 99 mg/dl Final   Glucose-Capillary  Date Value Ref Range Status  02/04/2018 122 (H) 70 - 99 mg/dL Final         Failed - K in normal range and within 180 days    Potassium  Date Value Ref Range Status  06/28/2022 3.9 3.5 - 5.3 mmol/L Final         Failed - Na in normal range and within 180 days    Sodium  Date Value Ref Range Status  06/28/2022 138 135 - 146 mmol/L Final         Failed - Bone Mineral Density or Dexa Scan completed in the last 2 years      Passed - Last BP in normal range    BP Readings from Last 1 Encounters:  12/09/23 128/78         Passed - Valid encounter within last 6 months    Recent Outpatient Visits           3 weeks ago Essential hypertension   [facility] Bernardo Fend, DO   1 month ago COPD exacerbation [facility])   [facility] Gareth Mliss FALCON, FNP   2 months ago COPD exacerbation [facility])   [facility] Bernardo Fend, DO   4 months ago COPD exacerbation [facility])   [facility] Bernardo Fend, DO   5 months ago Bilateral impacted cerumen   [facility] Bernardo Fend, DO       Future Appointments             In 2 months Bernardo Fend, DO [facility] [place]

## 2023-12-23 DIAGNOSIS — I10 Essential (primary) hypertension: Secondary | ICD-10-CM | POA: Diagnosis not present

## 2023-12-23 DIAGNOSIS — N1832 Chronic kidney disease, stage 3b: Secondary | ICD-10-CM | POA: Diagnosis not present

## 2023-12-23 DIAGNOSIS — E1122 Type 2 diabetes mellitus with diabetic chronic kidney disease: Secondary | ICD-10-CM | POA: Diagnosis not present

## 2023-12-23 DIAGNOSIS — Z794 Long term (current) use of insulin: Secondary | ICD-10-CM | POA: Diagnosis not present

## 2023-12-23 NOTE — Telephone Encounter (Signed)
 ATC patient to schedule video visit for new start Dupixent. LVMTCB.   Aleck Puls, PharmD, BCPS Clinical Pharmacist  [facility]

## 2023-12-24 MED ORDER — DUPIXENT 300 MG/2ML ~~LOC~~ SOAJ
300.0000 mg | SUBCUTANEOUS | 3 refills | Status: DC
  Filled 2024-01-01: qty 4, 28d supply, fill #0
  Filled 2024-01-27 – 2024-02-13 (×2): qty 4, 28d supply, fill #1
  Filled 2024-03-16: qty 4, 28d supply, fill #2
  Filled 2024-04-13 – 2024-04-24 (×4): qty 4, 28d supply, fill #3

## 2023-12-24 NOTE — Telephone Encounter (Signed)
 Rx triaged to [facility].   Aleck Puls, PharmD, BCPS Clinical Pharmacist  [facility]

## 2023-12-24 NOTE — Addendum Note (Signed)
 Addended by: Zania Kalisz L on: 12/24/2023 04:33 PM   Modules accepted: Orders

## 2023-12-24 NOTE — Telephone Encounter (Signed)
 Spoke with patient - booked for video visit new start Dupixent on 01/08/24.   He will set up MyChart for video visit capability between now and then, will alert team if he is unable to set up.   He is aware he will hear from Chas[place]e for onboarding.   Aleck Puls, PharmD, BCPS Clinical Pharmacist  [facility]

## 2023-12-25 ENCOUNTER — Encounter: Attending: Pulmonary Disease | Admitting: *Deleted

## 2023-12-25 DIAGNOSIS — J449 Chronic obstructive pulmonary disease, unspecified: Secondary | ICD-10-CM | POA: Insufficient documentation

## 2023-12-25 DIAGNOSIS — R0602 Shortness of breath: Secondary | ICD-10-CM | POA: Insufficient documentation

## 2023-12-25 NOTE — Progress Notes (Signed)
 Initial phone call completed. Diagnosis can be found in [facility] 9/15. EP Orientation scheduled for Tuesday 10/14 at 8am.

## 2024-01-01 ENCOUNTER — Other Ambulatory Visit: Payer: Self-pay

## 2024-01-01 NOTE — Progress Notes (Signed)
 Specialty Pharmacy Initial Fill Coordination Note  Zachary Wallace is a 69 y.o. male contacted today regarding initial fill of specialty medication(s) Dupilumab (Dupixent)   Patient requested Delivery   Delivery date: 01/07/24   Verified address: [address] [place], [place] [address]   Medication will be filled on 10/13.   Patient is aware of $0 copayment.    **Please do not fill and ship medication until 10/13, pt is out of town and won't return until Monday**

## 2024-01-03 ENCOUNTER — Other Ambulatory Visit: Payer: Self-pay | Admitting: Internal Medicine

## 2024-01-03 DIAGNOSIS — E782 Mixed hyperlipidemia: Secondary | ICD-10-CM

## 2024-01-06 ENCOUNTER — Other Ambulatory Visit (HOSPITAL_COMMUNITY): Payer: Self-pay

## 2024-01-06 ENCOUNTER — Other Ambulatory Visit: Payer: Self-pay

## 2024-01-06 NOTE — Telephone Encounter (Signed)
 Requested Prescriptions  Refused Prescriptions Disp Refills   atorvastatin  (LIPITOR) 40 MG tablet [Pharmacy Med Name: atorvastatin  40 mg tablet] 90 tablet 1    Sig: TAKE ONE TABLET BY MOUTH EVERY DAY     Cardiovascular:  Antilipid - Statins Failed - 01/06/2024  2:54 PM      Failed - Lipid Panel in normal range within the last 12 months    Cholesterol  Date Value Ref Range Status  07/02/2022 106 0 - 200 mg/dL Final   LDL Cholesterol (Calc)  Date Value Ref Range Status  09/01/2020 83 mg/dL (calc) Final    Comment:    Reference range: <100 . Desirable range <100 mg/dL for primary prevention;   <70 mg/dL for patients with CHD or diabetic patients  with > or = 2 CHD risk factors. SABRA LDL-C is now calculated using the Martin-Hopkins  calculation, which is a validated novel method providing  better accuracy than the Friedewald equation in the  estimation of LDL-C.  Gladis APPLETHWAITE et al. SANDREA. 7986;689(80): 2061-2068  (http://education.[employer].com/faq/FAQ164)    LDL Cholesterol  Date Value Ref Range Status  07/02/2022 43 0 - 99 mg/dL Final    Comment:           Total Cholesterol/HDL:CHD Risk Coronary Heart Disease Risk Table                     Men   Women  1/2 Average Risk   3.4   3.3  Average Risk       5.0   4.4  2 X Average Risk   9.6   7.1  3 X Average Risk  23.4   11.0        Use the calculated Patient Ratio above and the CHD Risk Table to determine the patient's CHD Risk.        ATP III CLASSIFICATION (LDL):  <100     mg/dL   Optimal  899-870  mg/dL   Near or Above                    Optimal  130-159  mg/dL   Borderline  839-810  mg/dL   High  >809     mg/dL   Very High Performed at [facility], [address] [place], [place] [address]    HDL  Date Value Ref Range Status  07/02/2022 41 >40 mg/dL Final   Triglycerides  Date Value Ref Range Status  07/02/2022 111 <150 mg/dL Final         Passed - Patient is not pregnant      Passed -  Valid encounter within last 12 months    Recent Outpatient Visits           1 month ago Essential hypertension   [facility] Bernardo Fend, DO   2 months ago COPD exacerbation [facility])   [facility] Gareth Mliss FALCON, FNP   2 months ago COPD exacerbation [facility])   [facility] Bernardo Fend, DO   5 months ago COPD exacerbation [facility])   [facility] Bernardo Fend, DO   6 months ago Bilateral impacted cerumen   [facility] Bernardo Fend, DO       Future Appointments             In 1 month Bernardo Fend, DO [facility], [place]

## 2024-01-07 ENCOUNTER — Other Ambulatory Visit (HOSPITAL_COMMUNITY): Payer: Self-pay

## 2024-01-07 ENCOUNTER — Encounter

## 2024-01-07 VITALS — Ht 69.0 in | Wt 181.1 lb

## 2024-01-07 DIAGNOSIS — J449 Chronic obstructive pulmonary disease, unspecified: Secondary | ICD-10-CM | POA: Diagnosis not present

## 2024-01-07 DIAGNOSIS — R0602 Shortness of breath: Secondary | ICD-10-CM | POA: Diagnosis not present

## 2024-01-07 NOTE — Patient Instructions (Signed)
 Patient Instructions  Patient Details  Name: Zachary Wallace MRN: [id number] Date of Birth: 1956/01/30 Referring Provider:  Malka Domino, MD  Below are your personal goals for exercise, nutrition, and risk factors. Our goal is to help you stay on track towards obtaining and maintaining these goals. We will be discussing your progress on these goals with you throughout the program.  Initial Exercise Prescription:  Initial Exercise Prescription - 01/07/24 1000       Date of Initial Exercise RX and Referring Provider   Date 01/07/24    Referring Provider Dr. Domino Malka, MD      Oxygen   Maintain Oxygen Saturation 88% or higher      Treadmill   MPH 2    Grade 0    Minutes 15    METs 2.53      Recumbant Bike   Level 2    RPM 50    Watts 24    Minutes 15    METs 2.92      NuStep   Level 2   T6 nustep   SPM 80    Minutes 15    METs 2.92      T5 Nustep   Level 1    SPM 80    Minutes 15    METs 2.92      Prescription Details   Frequency (times per week) 2    Duration Progress to 30 minutes of continuous aerobic without signs/symptoms of physical distress      Intensity   THRR 40-80% of Max Heartrate 118-142    Ratings of Perceived Exertion 11-13    Perceived Dyspnea 0-4      Progression   Progression Continue to progress workloads to maintain intensity without signs/symptoms of physical distress.      Resistance Training   Training Prescription Yes    Weight 5 lb    Reps 10-15          Exercise Goals: Frequency: Be able to perform aerobic exercise two to three times per week in program working toward 2-5 days per week of home exercise.  Intensity: Work with a perceived exertion of 11 (fairly light) - 15 (hard) while following your exercise prescription.  We will make changes to your prescription with you as you progress through the program.   Duration: Be able to do 30 to 45 minutes of continuous aerobic exercise in addition to a 5 minute  warm-up and a 5 minute cool-down routine.   Nutrition Goals: Your personal nutrition goals will be established when you do your nutrition analysis with the dietician.  The following are general nutrition guidelines to follow: Cholesterol < 200mg /day Sodium < 1500mg /day Fiber: Men over 50 yrs - 30 grams per day  Personal Goals:  Personal Goals and Risk Factors at Admission - 12/25/23 1008       Core Components/Risk Factors/Patient Goals on Admission    Weight Management Weight Maintenance;Yes    Intervention Weight Management: Provide education and appropriate resources to help participant work on and attain dietary goals.;Weight Management: Develop a combined nutrition and exercise program designed to reach desired caloric intake, while maintaining appropriate intake of nutrient and fiber, sodium and fats, and appropriate energy expenditure required for the weight goal.    Expected Outcomes Long Term: Adherence to nutrition and physical activity/exercise program aimed toward attainment of established weight goal;Short Term: Continue to assess and modify interventions until short term weight is achieved;Weight Maintenance: Understanding of the daily nutrition guidelines, which  includes 25-35% calories from fat, 7% or less cal from saturated fats, less than 200mg  cholesterol, less than 1.5gm of sodium, & 5 or more servings of fruits and vegetables daily;Understanding recommendations for meals to include 15-35% energy as protein, 25-35% energy from fat, 35-60% energy from carbohydrates, less than 200mg  of dietary cholesterol, 20-35 gm of total fiber daily;Understanding of distribution of calorie intake throughout the day with the consumption of 4-5 meals/snacks    Diabetes Yes    Intervention Provide education about signs/symptoms and action to take for hypo/hyperglycemia.;Provide education about proper nutrition, including hydration, and aerobic/resistive exercise prescription along with prescribed  medications to achieve blood glucose in normal ranges: Fasting glucose 65-99 mg/dL    Expected Outcomes Short Term: Participant verbalizes understanding of the signs/symptoms and immediate care of hyper/hypoglycemia, proper foot care and importance of medication, aerobic/resistive exercise and nutrition plan for blood glucose control.;Long Term: Attainment of HbA1C < 7%.    Hypertension Yes    Intervention Provide education on lifestyle modifcations including regular physical activity/exercise, weight management, moderate sodium restriction and increased consumption of fresh fruit, vegetables, and low fat dairy, alcohol moderation, and smoking cessation.;Monitor prescription use compliance.    Expected Outcomes Short Term: Continued assessment and intervention until BP is < 140/47mm HG in hypertensive participants. < 130/76mm HG in hypertensive participants with diabetes, heart failure or chronic kidney disease.;Long Term: Maintenance of blood pressure at goal levels.    Lipids Yes    Intervention Provide education and support for participant on nutrition & aerobic/resistive exercise along with prescribed medications to achieve LDL 70mg , HDL >40mg .    Expected Outcomes Short Term: Participant states understanding of desired cholesterol values and is compliant with medications prescribed. Participant is following exercise prescription and nutrition guidelines.;Long Term: Cholesterol controlled with medications as prescribed, with individualized exercise RX and with personalized nutrition plan. Value goals: LDL < 70mg , HDL > 40 mg.         Exercise Goals and Review:  Exercise Goals     Row Name 01/07/24 1010             Exercise Goals   Increase Physical Activity Yes       Intervention Provide advice, education, support and counseling about physical activity/exercise needs.;Develop an individualized exercise prescription for aerobic and resistive training based on initial evaluation findings,  risk stratification, comorbidities and participant's personal goals.       Expected Outcomes Short Term: Attend rehab on a regular basis to increase amount of physical activity.;Long Term: Add in home exercise to make exercise part of routine and to increase amount of physical activity.;Long Term: Exercising regularly at least 3-5 days a week.       Increase Strength and Stamina Yes       Intervention Provide advice, education, support and counseling about physical activity/exercise needs.;Develop an individualized exercise prescription for aerobic and resistive training based on initial evaluation findings, risk stratification, comorbidities and participant's personal goals.       Expected Outcomes Short Term: Perform resistance training exercises routinely during rehab and add in resistance training at home;Short Term: Increase workloads from initial exercise prescription for resistance, speed, and METs.;Long Term: Improve cardiorespiratory fitness, muscular endurance and strength as measured by increased METs and functional capacity ( )       Able to understand and use rate of perceived exertion (RPE) scale Yes       Intervention Provide education and explanation on how to use RPE scale  Expected Outcomes Short Term: Able to use RPE daily in rehab to express subjective intensity level;Long Term:  Able to use RPE to guide intensity level when exercising independently       Able to understand and use Dyspnea scale Yes       Intervention Provide education and explanation on how to use Dyspnea scale       Expected Outcomes Long Term: Able to use Dyspnea scale to guide intensity level when exercising independently;Short Term: Able to use Dyspnea scale daily in rehab to express subjective sense of shortness of breath during exertion       Knowledge and understanding of Target Heart Rate Range (THRR) Yes       Intervention Provide education and explanation of THRR including how the numbers were  predicted and where they are located for reference       Expected Outcomes Short Term: Able to state/look up THRR;Long Term: Able to use THRR to govern intensity when exercising independently;Short Term: Able to use daily as guideline for intensity in rehab       Able to check pulse independently Yes       Intervention Provide education and demonstration on how to check pulse in carotid and radial arteries.;Review the importance of being able to check your own pulse for safety during independent exercise       Expected Outcomes Short Term: Able to explain why pulse checking is important during independent exercise;Long Term: Able to check pulse independently and accurately       Understanding of Exercise Prescription Yes       Intervention Provide education, explanation, and written materials on patient's individual exercise prescription       Expected Outcomes Short Term: Able to explain program exercise prescription;Long Term: Able to explain home exercise prescription to exercise independently

## 2024-01-07 NOTE — Progress Notes (Signed)
 Pulmonary Individual Treatment Plan  Patient Details  Name: Zachary Wallace MRN: [id number] Date of Birth: Jul 08, 1955 Referring Provider:   Flowsheet Row Pulmonary Rehab from 01/07/2024 in [facility] Cardiac and Pulmonary Rehab  Referring Provider Dr. Darrin Barn, MD    Initial Encounter Date:  Flowsheet Row Pulmonary Rehab from 01/07/2024 in [facility] Cardiac and Pulmonary Rehab  Date 01/07/24    Visit Diagnosis: Chronic obstructive pulmonary disease, unspecified COPD type (HCC)  Patient's Home Medications on Admission:  Current Outpatient Medications:    albuterol  (PROVENTIL ) (2.5 MG/3ML) 0.083% nebulizer solution, USE ONE ampule via NEBULIZER EVERY 6 HOURS AS NEEDED SHORTNESS OF BREATH OR wheezing, Disp: 180 mL, Rfl: 3   albuterol  (VENTOLIN  HFA) 108 (90 Base) MCG/ACT inhaler, INHALE 1 TO 2 PUFFS BY MOUTH EVERY 4 HOURS AS NEEDED FOR WHEEZING OR FOR SHORTNESS OF BREATH, Disp: 18 g, Rfl: 3   amLODipine  (NORVASC ) 10 MG tablet, Take 1 tablet (10 mg total) by mouth daily., Disp: 90 tablet, Rfl: 1   aspirin 81 MG tablet, Take 81 mg by mouth., Disp: , Rfl:    atorvastatin  (LIPITOR) 40 MG tablet, Take 1 tablet (40 mg total) by mouth daily., Disp: 90 tablet, Rfl: 1   B-D UF III MINI PEN NEEDLES 31G X 5 MM MISC, , Disp: , Rfl:    benzonatate  (TESSALON ) 100 MG capsule, Take 1 capsule (100 mg total) by mouth 2 (two) times daily as needed for cough., Disp: 20 capsule, Rfl: 0   bisoprolol  (ZEBETA ) 5 MG tablet, TAKE ONE TABLET BY MOUTH TWICE DAILY, Disp: 60 tablet, Rfl: 2   budesonide-glycopyrrolate -formoterol  (BREZTRI  AEROSPHERE) 160-9-4.8 MCG/ACT AERO inhaler, Inhale 2 puffs into the lungs 2 (two) times daily., Disp: 10.7 g, Rfl: 11   dicyclomine  (BENTYL ) 10 MG capsule, TAKE ONE CAPSULE BY MOUTH THREE TIMES DAILY AS NEEDED FOR MUSCLE SPASMS, Disp: 270 capsule, Rfl: 0   Dulaglutide 0.75 MG/0.5ML SOPN, Inject 3 mg into the skin once a week., Disp: , Rfl:    Dupilumab (DUPIXENT) 300 MG/2ML SOAJ, Inject 300  mg into the skin every 14 (fourteen) days., Disp: 4 mL, Rfl: 3   glucose blood (ONETOUCH VERIO) test strip, , Disp: , Rfl:    insulin degludec (TRESIBA) 100 UNIT/ML FlexTouch Pen, Inject 26 Units into the skin daily., Disp: , Rfl:    losartan -hydrochlorothiazide (HYZAAR) 100-12.5 MG tablet, Take 1 tablet by mouth daily., Disp: 90 tablet, Rfl: 1   pantoprazole  (PROTONIX ) 20 MG tablet, TAKE ONE TABLET BY MOUTH ONCE DAILY, Disp: 90 tablet, Rfl: 1   sildenafil  (VIAGRA ) 100 MG tablet, Take 1 tablet (100 mg total) by mouth as needed for erectile dysfunction., Disp: 30 tablet, Rfl: 5  Current Facility-Administered Medications:    ipratropium-albuterol  (DUONEB) 0.5-2.5 (3) MG/3ML nebulizer solution 3 mL, 3 mL, Nebulization, Once,   Past Medical History: Past Medical History:  Diagnosis Date   COPD (chronic obstructive pulmonary disease) (HCC)    Diabetes mellitus without complication (HCC)    Hyperlipidemia    Hypertension    OSA (obstructive sleep apnea)     Tobacco Use: Social History   Tobacco Use  Smoking Status Former   Current packs/day: 0.00   Average packs/day: 1 pack/day for 41.0 years (41.0 ttl pk-yrs)   Types: Cigarettes   Start date: 03/13/1981   Quit date: 03/13/2022   Years since quitting: 1.8   Passive exposure: Past  Smokeless Tobacco Never  Tobacco Comments   Smoked last on 12/11.  Trying to quit.   Using patches.  HFB  03/13/2022    Labs: Review Flowsheet  More data exists      Latest Ref Rng & Units 06/28/2022 07/02/2022 11/23/2022 05/24/2023 11/28/2023  Labs for ITP Cardiac and Pulmonary Rehab  Cholestrol 0 - 200 mg/dL - 893  - - -  LDL (calc) 0 - 99 mg/dL - 43  - - -  HDL-C >59 mg/dL - 41  - - -  Trlycerides <150 mg/dL - 888  - - -  Hemoglobin A1c 4.0 - 5.6 % 9.4  - 6.9     7.6     7.4     Details       This result is from an external source.          Pulmonary Assessment Scores:  Pulmonary Assessment Scores     Row Name 01/07/24 0941         ADL  UCSD   ADL Phase Entry     SOB Score total 86     Rest 3     Walk 3     Stairs 3     Bath 5     Dress 4     Shop 2       CAT Score   CAT Score 21       mMRC Score   mMRC Score 2        UCSD: Self-administered rating of dyspnea associated with activities of daily living (ADLs) 6-point scale (0 = not at all to 5 = maximal or unable to do because of breathlessness)  Scoring Scores range from 0 to 120.  Minimally important difference is 5 units  CAT: CAT can identify the health impairment of COPD patients and is better correlated with disease progression.  CAT has a scoring range of zero to 40. The CAT score is classified into four groups of low (less than 10), medium (10 - 20), high (21-30) and very high (31-40) based on the impact level of disease on health status. A CAT score over 10 suggests significant symptoms.  A worsening CAT score could be explained by an exacerbation, poor medication adherence, poor inhaler technique, or progression of COPD or comorbid conditions.  CAT MCID is 2 points  mMRC: mMRC (Modified Medical Research Council) Dyspnea Scale is used to assess the degree of baseline functional disability in patients of respiratory disease due to dyspnea. No minimal important difference is established. A decrease in score of 1 point or greater is considered a positive change.   Pulmonary Function Assessment:   Exercise Target Goals: Exercise Program Goal: Individual exercise prescription set using results from initial 6 min walk test and THRR while considering  patient's activity barriers and safety.   Exercise Prescription Goal: Initial exercise prescription builds to 30-45 minutes a day of aerobic activity, 2-3 days per week.  Home exercise guidelines will be given to patient during program as part of exercise prescription that the participant will acknowledge.  Education: Aerobic Exercise: - Group verbal and visual presentation on the components of exercise  prescription. Introduces F.I.T.T principle from ACSM for exercise prescriptions.  Reviews F.I.T.T. principles of aerobic exercise including progression. Written material provided at class time.   Education: Resistance Exercise: - Group verbal and visual presentation on the components of exercise prescription. Introduces F.I.T.T principle from ACSM for exercise prescriptions  Reviews F.I.T.T. principles of resistance exercise including progression. Written material provided at class time.    Education: Exercise & Equipment Safety: - Individual verbal instruction and demonstration of  equipment use and safety with use of the equipment. Flowsheet Row Pulmonary Rehab from 01/07/2024 in [facility] Cardiac and Pulmonary Rehab  Date 01/07/24  Educator NT  Instruction Review Code 1- Verbalizes Understanding    Education: Exercise Physiology & General Exercise Guidelines: - Group verbal and written instruction with models to review the exercise physiology of the cardiovascular system and associated critical values. Provides general exercise guidelines with specific guidelines to those with heart or lung disease.    Education: Flexibility, Balance, Mind/Body Relaxation: - Group verbal and visual presentation with interactive activity on the components of exercise prescription. Introduces F.I.T.T principle from ACSM for exercise prescriptions. Reviews F.I.T.T. principles of flexibility and balance exercise training including progression. Also discusses the mind body connection.  Reviews various relaxation techniques to help reduce and manage stress (i.e. Deep breathing, progressive muscle relaxation, and visualization). Balance handout provided to take home. Written material provided at class time.   Activity Barriers & Risk Stratification:  Activity Barriers & Cardiac Risk Stratification - 12/25/23 1003       Activity Barriers & Cardiac Risk Stratification   Activity Barriers Left Knee Replacement           6 Minute Walk:  6 Minute Walk     Row Name 01/07/24 1007         6 Minute Walk   Phase Initial     Distance 1145 feet     Walk Time 6 minutes     # of Rest Breaks 0     MPH 2.17     METS 2.92     RPE 11     Perceived Dyspnea  3     VO2 Peak 10.22     Symptoms Yes (comment)     Comments SOB     Resting HR 95 bpm     Resting BP 112/68     Resting Oxygen Saturation  93 %     Exercise Oxygen Saturation  during 6 min walk 83 %     Max Ex. HR 105 bpm     Max Ex. BP 124/66     2 Minute Post BP 118/66       Interval HR   1 Minute HR 98     2 Minute HR 101     3 Minute HR 102     4 Minute HR 101     5 Minute HR 105     6 Minute HR 104     2 Minute Post HR 88     Interval Heart Rate? Yes       Interval Oxygen   Interval Oxygen? Yes     Baseline Oxygen Saturation % 93 %     1 Minute Oxygen Saturation % 86 %     1 Minute Liters of Oxygen 0 L  RA     2 Minute Oxygen Saturation % 85 %     2 Minute Liters of Oxygen 0 L     3 Minute Oxygen Saturation % 83 %     3 Minute Liters of Oxygen 0 L     4 Minute Oxygen Saturation % 84 %     4 Minute Liters of Oxygen 0 L     5 Minute Oxygen Saturation % 85 %     5 Minute Liters of Oxygen 0 L     6 Minute Oxygen Saturation % 84 %     6 Minute Liters of Oxygen 0 L  2 Minute Post Oxygen Saturation % 92 %     2 Minute Post Liters of Oxygen 0 L       Oxygen Initial Assessment:  Oxygen Initial Assessment - 12/25/23 1006       Home Oxygen   Home Oxygen Device None    Sleep Oxygen Prescription None    Home Exercise Oxygen Prescription None    Home Resting Oxygen Prescription None    Compliance with Home Oxygen Use Yes      Intervention   Short Term Goals To learn and understand importance of monitoring SPO2 with pulse oximeter and demonstrate accurate use of the pulse oximeter.;To learn and understand importance of maintaining oxygen saturations>88%;To learn and demonstrate proper pursed lip breathing techniques or other  breathing techniques. ;To learn and demonstrate proper use of respiratory medications    Long  Term Goals Verbalizes importance of monitoring SPO2 with pulse oximeter and return demonstration;Maintenance of O2 saturations>88%;Exhibits proper breathing techniques, such as pursed lip breathing or other method taught during program session;Compliance with respiratory medication;Demonstrates proper use of MDI's          Oxygen Re-Evaluation:   Oxygen Discharge (Final Oxygen Re-Evaluation):   Initial Exercise Prescription:  Initial Exercise Prescription - 01/07/24 1000       Date of Initial Exercise RX and Referring Provider   Date 01/07/24    Referring Provider Dr. Darrin Barn, MD      Oxygen   Maintain Oxygen Saturation 88% or higher      Treadmill   MPH 2    Grade 0    Minutes 15    METs 2.53      Recumbant Bike   Level 2    RPM 50    Watts 24    Minutes 15    METs 2.92      NuStep   Level 2   T6 nustep   SPM 80    Minutes 15    METs 2.92      T5 Nustep   Level 1    SPM 80    Minutes 15    METs 2.92      Prescription Details   Frequency (times per week) 2    Duration Progress to 30 minutes of continuous aerobic without signs/symptoms of physical distress      Intensity   THRR 40-80% of Max Heartrate 118-142    Ratings of Perceived Exertion 11-13    Perceived Dyspnea 0-4      Progression   Progression Continue to progress workloads to maintain intensity without signs/symptoms of physical distress.      Resistance Training   Training Prescription Yes    Weight 5 lb    Reps 10-15          Perform Capillary Blood Glucose checks as needed.  Exercise Prescription Changes:   Exercise Prescription Changes     Row Name 01/07/24 1000             Response to Exercise   Blood Pressure (Admit) 112/68       Blood Pressure (Exercise) 124/66       Blood Pressure (Exit) 118/66       Heart Rate (Admit) 95 bpm       Heart Rate (Exercise) 105  bpm       Heart Rate (Exit) 88 bpm       Oxygen Saturation (Admit) 93 %       Oxygen Saturation (Exercise) 83 %  Oxygen Saturation (Exit) 92 %       Rating of Perceived Exertion (Exercise) 11       Perceived Dyspnea (Exercise) 3       Symptoms SOB       Comments Results          Exercise Comments:   Exercise Goals and Review:   Exercise Goals     Row Name 01/07/24 1010             Exercise Goals   Increase Physical Activity Yes       Intervention Provide advice, education, support and counseling about physical activity/exercise needs.;Develop an individualized exercise prescription for aerobic and resistive training based on initial evaluation findings, risk stratification, comorbidities and participant's personal goals.       Expected Outcomes Short Term: Attend rehab on a regular basis to increase amount of physical activity.;Long Term: Add in home exercise to make exercise part of routine and to increase amount of physical activity.;Long Term: Exercising regularly at least 3-5 days a week.       Increase Strength and Stamina Yes       Intervention Provide advice, education, support and counseling about physical activity/exercise needs.;Develop an individualized exercise prescription for aerobic and resistive training based on initial evaluation findings, risk stratification, comorbidities and participant's personal goals.       Expected Outcomes Short Term: Perform resistance training exercises routinely during rehab and add in resistance training at home;Short Term: Increase workloads from initial exercise prescription for resistance, speed, and METs.;Long Term: Improve cardiorespiratory fitness, muscular endurance and strength as measured by increased METs and functional capacity ( )       Able to understand and use rate of perceived exertion (RPE) scale Yes       Intervention Provide education and explanation on how to use RPE scale       Expected Outcomes Short  Term: Able to use RPE daily in rehab to express subjective intensity level;Long Term:  Able to use RPE to guide intensity level when exercising independently       Able to understand and use Dyspnea scale Yes       Intervention Provide education and explanation on how to use Dyspnea scale       Expected Outcomes Long Term: Able to use Dyspnea scale to guide intensity level when exercising independently;Short Term: Able to use Dyspnea scale daily in rehab to express subjective sense of shortness of breath during exertion       Knowledge and understanding of Target Heart Rate Range (THRR) Yes       Intervention Provide education and explanation of THRR including how the numbers were predicted and where they are located for reference       Expected Outcomes Short Term: Able to state/look up THRR;Long Term: Able to use THRR to govern intensity when exercising independently;Short Term: Able to use daily as guideline for intensity in rehab       Able to check pulse independently Yes       Intervention Provide education and demonstration on how to check pulse in carotid and radial arteries.;Review the importance of being able to check your own pulse for safety during independent exercise       Expected Outcomes Short Term: Able to explain why pulse checking is important during independent exercise;Long Term: Able to check pulse independently and accurately       Understanding of Exercise Prescription Yes       Intervention Provide  education, explanation, and written materials on patient's individual exercise prescription       Expected Outcomes Short Term: Able to explain program exercise prescription;Long Term: Able to explain home exercise prescription to exercise independently          Exercise Goals Re-Evaluation :   Discharge Exercise Prescription (Final Exercise Prescription Changes):  Exercise Prescription Changes - 01/07/24 1000       Response to Exercise   Blood Pressure (Admit) 112/68     Blood Pressure (Exercise) 124/66    Blood Pressure (Exit) 118/66    Heart Rate (Admit) 95 bpm    Heart Rate (Exercise) 105 bpm    Heart Rate (Exit) 88 bpm    Oxygen Saturation (Admit) 93 %    Oxygen Saturation (Exercise) 83 %    Oxygen Saturation (Exit) 92 %    Rating of Perceived Exertion (Exercise) 11    Perceived Dyspnea (Exercise) 3    Symptoms SOB    Comments Results          Nutrition:  Target Goals: Understanding of nutrition guidelines, daily intake of sodium 1500mg , cholesterol 200mg , calories 30% from fat and 7% or less from saturated fats, daily to have 5 or more servings of fruits and vegetables.  Education: Nutrition 1 -Group instruction provided by verbal, written material, interactive activities, discussions, models, and posters to present general guidelines for heart healthy nutrition including macronutrients, label reading, and promoting whole foods over processed counterparts. Education serves as Pensions consultant of discussion of heart healthy eating for all. Written material provided at class time.     Education: Nutrition 2 -Group instruction provided by verbal, written material, interactive activities, discussions, models, and posters to present general guidelines for heart healthy nutrition including sodium, cholesterol, and saturated fat. Providing guidance of habit forming to improve blood pressure, cholesterol, and body weight. Written material provided at class time.     Biometrics:  Pre Biometrics - 01/07/24 1011       Pre Biometrics   Height 5' 9 (1.753 m)    Weight 181 lb 1.6 oz (82.1 kg)    Waist Circumference 38.5 inches    Hip Circumference 37.5 inches    Waist to Hip Ratio 1.03 %    BMI (Calculated) 26.73    Single Leg Stand 18.1 seconds           Nutrition Therapy Plan and Nutrition Goals:  Nutrition Therapy & Goals - 01/07/24 0955       Nutrition Therapy   RD appointment deferred Yes      Intervention Plan   Intervention  Prescribe, educate and counsel regarding individualized specific dietary modifications aiming towards targeted core components such as weight, hypertension, lipid management, diabetes, heart failure and other comorbidities.    Expected Outcomes Short Term Goal: A plan has been developed with personal nutrition goals set during dietitian appointment.;Short Term Goal: Understand basic principles of dietary content, such as calories, fat, sodium, cholesterol and nutrients.;Long Term Goal: Adherence to prescribed nutrition plan.          Nutrition Assessments:  MEDIFICTS Score Key: >=70 Need to make dietary changes  40-70 Heart Healthy Diet <= 40 Therapeutic Level Cholesterol Diet  Flowsheet Row Pulmonary Rehab from 01/07/2024 in [facility] Cardiac and Pulmonary Rehab  Picture Your Plate Total Score on Admission 37   Picture Your Plate Scores: <59 Unhealthy dietary pattern with much room for improvement. 41-50 Dietary pattern unlikely to meet recommendations for good health and room for improvement. 51-60 More  healthful dietary pattern, with some room for improvement.  >60 Healthy dietary pattern, although there may be some specific behaviors that could be improved.   Nutrition Goals Re-Evaluation:   Nutrition Goals Discharge (Final Nutrition Goals Re-Evaluation):   Psychosocial: Target Goals: Acknowledge presence or absence of significant depression and/or stress, maximize coping skills, provide positive support system. Participant is able to verbalize types and ability to use techniques and skills needed for reducing stress and depression.   Education: Stress, Anxiety, and Depression - Group verbal and visual presentation to define topics covered.  Reviews how body is impacted by stress, anxiety, and depression.  Also discusses healthy ways to reduce stress and to treat/manage anxiety and depression.  Written material provided at class time.   Education: Sleep Hygiene -Provides group  verbal and written instruction about how sleep can affect your health.  Define sleep hygiene, discuss sleep cycles and impact of sleep habits. Review good sleep hygiene tips.    Initial Review & Psychosocial Screening:  Initial Psych Review & Screening - 12/25/23 1014       Initial Review   Current issues with None Identified      Family Dynamics   Good Support System? Yes      Barriers   Psychosocial barriers to participate in program There are no identifiable barriers or psychosocial needs.      Screening Interventions   Interventions Encouraged to exercise;To provide support and resources with identified psychosocial needs;Provide feedback about the scores to participant    Expected Outcomes Short Term goal: Utilizing psychosocial counselor, staff and physician to assist with identification of specific Stressors or current issues interfering with healing process. Setting desired goal for each stressor or current issue identified.;Long Term Goal: Stressors or current issues are controlled or eliminated.;Short Term goal: Identification and review with participant of any Quality of Life or Depression concerns found by scoring the questionnaire.;Long Term goal: The participant improves quality of Life and PHQ9 Scores as seen by post scores and/or verbalization of changes          Quality of Life Scores:  Scores of 19 and below usually indicate a poorer quality of life in these areas.  A difference of  2-3 points is a clinically meaningful difference.  A difference of 2-3 points in the total score of the Quality of Life Index has been associated with significant improvement in overall quality of life, self-image, physical symptoms, and general health in studies assessing change in quality of life.  PHQ-9: Review Flowsheet  More data exists      01/07/2024 12/19/2023 05/28/2023 02/05/2023 12/13/2022  Depression screen PHQ 2/9  Decreased Interest 2 0 0 0 0  Down, Depressed, Hopeless 0 0 0  0 0  PHQ - 2 Score 2 0 0 0 0  Altered sleeping 0 0 - 0 -  Tired, decreased energy 3 0 - 0 -  Change in appetite 3 0 - 0 -  Feeling bad or failure about yourself  0 0 - 0 -  Trouble concentrating 0 0 - 0 -  Moving slowly or fidgety/restless 0 0 - 0 -  Suicidal thoughts 0 0 - 0 -  PHQ-9 Score 8 0 - 0 -  Difficult doing work/chores Very difficult Not difficult at all - Not difficult at all -   Interpretation of Total Score  Total Score Depression Severity:  1-4 = Minimal depression, 5-9 = Mild depression, 10-14 = Moderate depression, 15-19 = Moderately severe depression, 20-27 = Severe depression  Psychosocial Evaluation and Intervention:  Psychosocial Evaluation - 12/25/23 1014       Psychosocial Evaluation & Interventions   Interventions Encouraged to exercise with the program and follow exercise prescription    Comments Mr. Fulop is coming to pulmonary rehab with COPD. He states his shortness of breath has gotten worse over the last year and is ready to try this program to see if it will help. 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 He does not have any stress concerns at this time.    Expected Outcomes Short: attend pulmonary rehab for education and exercise Long: develop and maintain positive self care habits    Continue Psychosocial Services  Follow up required by staff          Psychosocial Re-Evaluation:   Psychosocial Discharge (Final Psychosocial Re-Evaluation):   Education: Education Goals: Education classes will be provided on a weekly basis, covering required topics. Participant will state understanding/return demonstration of topics presented.  Learning Barriers/Preferences:  Learning Barriers/Preferences - 12/25/23 1009       Learning Barriers/Preferences   Learning Barriers None    Learning Preferences None          General Pulmonary Education Topics:  Infection Prevention: - Provides verbal and written  material to individual with discussion of infection control including proper hand washing and proper equipment cleaning during exercise session. Flowsheet Row Pulmonary Rehab from 01/07/2024 in [facility] Cardiac and Pulmonary Rehab  Date 01/07/24  Educator NT  Instruction Review Code 1- Verbalizes Understanding    Falls Prevention: - Provides verbal and written material to individual with discussion of falls prevention and safety. Flowsheet Row Pulmonary Rehab from 01/07/2024 in [facility] Cardiac and Pulmonary Rehab  Date 01/07/24  Educator NT  Instruction Review Code 1- Verbalizes Understanding    Chronic Lung Disease Review: - Group verbal instruction with posters, models, PowerPoint presentations and videos,  to review new updates, new respiratory medications, new advancements in procedures and treatments. Providing information on websites and 800 numbers for continued self-education. Includes information about supplement oxygen, available portable oxygen systems, continuous and intermittent flow rates, oxygen safety, concentrators, and Medicare reimbursement for oxygen. Explanation of Pulmonary Drugs, including class, frequency, complications, importance of spacers, rinsing mouth after steroid MDI's, and proper cleaning methods for nebulizers. Review of basic lung anatomy and physiology related to function, structure, and complications of lung disease. Review of risk factors. Discussion about methods for diagnosing sleep apnea and types of masks and machines for OSA. Includes a review of the use of types of environmental controls: home humidity, furnaces, filters, dust mite/pet prevention, HEPA vacuums. Discussion about weather changes, air quality and the benefits of nasal washing. Instruction on Warning signs, infection symptoms, calling MD promptly, preventive modes, and value of vaccinations. Review of effective airway clearance, coughing and/or vibration techniques. Emphasizing that all should  Create an Action Plan. Written material provided at class time. Flowsheet Row Pulmonary Rehab from 01/07/2024 in [facility] Cardiac and Pulmonary Rehab  Education need identified 01/07/24    AED/CPR: - Group verbal and written instruction with the use of models to demonstrate the basic use of the AED with the basic ABC's of resuscitation.    Tests and Procedures:  - Group verbal and visual presentation and models provide information about basic cardiac anatomy and function. Reviews the testing methods done to diagnose heart disease and the outcomes of the test results. Describes the treatment choices: Medical Management, Angioplasty, or Coronary Bypass Surgery  for treating various heart conditions including Myocardial Infarction, Angina, Valve Disease, and Cardiac Arrhythmias.  Written material provided at class time.   Medication Safety: - Group verbal and visual instruction to review commonly prescribed medications for heart and lung disease. Reviews the medication, class of the drug, and side effects. Includes the steps to properly store meds and maintain the prescription regimen.  Written material given at graduation.   Other: -Provides group and verbal instruction on various topics (see comments)   Knowledge Questionnaire Score:  Knowledge Questionnaire Score - 01/07/24 0954       Knowledge Questionnaire Score   Pre Score 13/18           Core Components/Risk Factors/Patient Goals at Admission:  Personal Goals and Risk Factors at Admission - 12/25/23 1008       Core Components/Risk Factors/Patient Goals on Admission    Weight Management Weight Maintenance;Yes    Intervention Weight Management: Provide education and appropriate resources to help participant work on and attain dietary goals.;Weight Management: Develop a combined nutrition and exercise program designed to reach desired caloric intake, while maintaining appropriate intake of nutrient and fiber, sodium and fats, and  appropriate energy expenditure required for the weight goal.    Expected Outcomes Long Term: Adherence to nutrition and physical activity/exercise program aimed toward attainment of established weight goal;Short Term: Continue to assess and modify interventions until short term weight is achieved;Weight Maintenance: Understanding of the daily nutrition guidelines, which includes 25-35% calories from fat, 7% or less cal from saturated fats, less than 200mg  cholesterol, less than 1.5gm of sodium, & 5 or more servings of fruits and vegetables daily;Understanding recommendations for meals to include 15-35% energy as protein, 25-35% energy from fat, 35-60% energy from carbohydrates, less than 200mg  of dietary cholesterol, 20-35 gm of total fiber daily;Understanding of distribution of calorie intake throughout the day with the consumption of 4-5 meals/snacks    Diabetes Yes    Intervention Provide education about signs/symptoms and action to take for hypo/hyperglycemia.;Provide education about proper nutrition, including hydration, and aerobic/resistive exercise prescription along with prescribed medications to achieve blood glucose in normal ranges: Fasting glucose 65-99 mg/dL    Expected Outcomes Short Term: Participant verbalizes understanding of the signs/symptoms and immediate care of hyper/hypoglycemia, proper foot care and importance of medication, aerobic/resistive exercise and nutrition plan for blood glucose control.;Long Term: Attainment of HbA1C < 7%.    Hypertension Yes    Intervention Provide education on lifestyle modifcations including regular physical activity/exercise, weight management, moderate sodium restriction and increased consumption of fresh fruit, vegetables, and low fat dairy, alcohol moderation, and smoking cessation.;Monitor prescription use compliance.    Expected Outcomes Short Term: Continued assessment and intervention until BP is < 140/22mm HG in hypertensive participants. <  130/26mm HG in hypertensive participants with diabetes, heart failure or chronic kidney disease.;Long Term: Maintenance of blood pressure at goal levels.    Lipids Yes    Intervention Provide education and support for participant on nutrition & aerobic/resistive exercise along with prescribed medications to achieve LDL 70mg , HDL >40mg .    Expected Outcomes Short Term: Participant states understanding of desired cholesterol values and is compliant with medications prescribed. Participant is following exercise prescription and nutrition guidelines.;Long Term: Cholesterol controlled with medications as prescribed, with individualized exercise RX and with personalized nutrition plan. Value goals: LDL < 70mg , HDL > 40 mg.          Education:Diabetes - Individual verbal and written instruction to review signs/symptoms of diabetes, desired ranges  of glucose level fasting, after meals and with exercise. Acknowledge that pre and post exercise glucose checks will be done for 3 sessions at entry of program. Flowsheet Row Pulmonary Rehab from 01/07/2024 in [facility] Cardiac and Pulmonary Rehab  Date 12/25/23  Educator [facility]  Instruction Review Code 1- Verbalizes Understanding    Know Your Numbers and Heart Failure: - Group verbal and visual instruction to discuss disease risk factors for cardiac and pulmonary disease and treatment options.  Reviews associated critical values for Overweight/Obesity, Hypertension, Cholesterol, and Diabetes.  Discusses basics of heart failure: signs/symptoms and treatments.  Introduces Heart Failure Zone chart for action plan for heart failure. Written material provided at class time.   Core Components/Risk Factors/Patient Goals Review:    Core Components/Risk Factors/Patient Goals at Discharge (Final Review):    ITP Comments:  ITP Comments     Row Name 12/25/23 1016 01/07/24 0929         ITP Comments Initial phone call completed. Diagnosis can be found in [facility] 9/15. EP  Orientation scheduled for Tuesday 10/14 at 8am. Completed and gym orientation for pulmonary rehab. Initial ITP created and sent for review to Dr. Fuad Aleskerov, Medical Director.         Comments: Initial ITP

## 2024-01-08 ENCOUNTER — Other Ambulatory Visit

## 2024-01-08 ENCOUNTER — Ambulatory Visit

## 2024-01-08 DIAGNOSIS — Z7189 Other specified counseling: Secondary | ICD-10-CM

## 2024-01-08 DIAGNOSIS — J449 Chronic obstructive pulmonary disease, unspecified: Secondary | ICD-10-CM

## 2024-01-08 NOTE — Patient Instructions (Signed)
 Your next Dupixent dose is due on 01/22/24, 02/05/24, and every 14 days thereafter.   CONTINUE Breztri  160-9-4.8 mcg/act (Inhale 2 puffs into the lungs 2 (two) times daily). Dupixent does NOT replace your other medications.  Your prescription will be shipped from [facility]: [phone].  Please call to schedule shipment and confirm address. They will mail your medication to your home.  You will need to be seen by your provider in 3 to 4 months to assess how Dupixent is working for you. You have a follow-up appointment scheduled on 03/09/24 with Dr. Malka.   Stay up to date on all routine vaccines: influenza, pneumonia, COVID19, Shingles  How to manage an injection site reaction: Remember the 5 C's: COUNTER - leave on the counter at least 30 minutes but up to overnight to bring medication to room temperature. This may help prevent stinging COLD - place something cold (like an ice gel pack or cold water bottle) on the injection site just before cleansing with alcohol. This may help reduce pain CLARITIN - use Claritin (generic name is loratadine) for the first two weeks of treatment or the day of, the day before, and the day after injecting. This will help to minimize injection site reactions CORTISONE CREAM - apply if injection site is irritated and itching CALL ME - if injection site reaction is bigger than the size of your fist, looks infected, blisters, or if you develop hives

## 2024-01-08 NOTE — Progress Notes (Signed)
 Zachary Wallace was educated in detail about Dupixent use in clinic on 01/08/24. Able to self-administer. Tolerated well.   PLAN Continue Dupixent 300mg  every 14 days.  Next dose is due 01/22/24 and every 14 days thereafter. Rx sent to: [facility]: [phone] .  Patient provided with pharmacy phone number.  Continue maintenance inhaler regimen of: Breztri  160-9-4.8 mcg/act (Inhale 2 puffs into the lungs 2 (two) times daily)   All questions encouraged and answered.  Instructed patient to reach out with any further questions or concerns.  Aleck Puls, PharmD, BCPS, CPP Clinical Pharmacist  [facility]

## 2024-01-08 NOTE — Progress Notes (Signed)
 HPI Patient presents today to [facility] to see pharmacy team for Dupixent new start. He has COPD. There are four prescription dispenses of prednisone  this year per prescription dispense history.   Respiratory Medications Current regimen: Breztri  160-9-4.8 mcg/act (Inhale 2 puffs into the lungs 2 (two) times daily), Ventolin  HFA 108 mcg/act (Inhale 1 to 2 puffs by mouth every 4 hours as needed for wheezing or for shortness of breath)   Patient reports no known adherence challenges. However, he reports he frequently uses his Ventolin  inhaler.   OBJECTIVE No Known Allergies  Outpatient Encounter Medications as of 01/08/2024  Medication Sig   albuterol  (PROVENTIL ) (2.5 MG/3ML) 0.083% nebulizer solution USE ONE ampule via NEBULIZER EVERY 6 HOURS AS NEEDED SHORTNESS OF BREATH OR wheezing   albuterol  (VENTOLIN  HFA) 108 (90 Base) MCG/ACT inhaler INHALE 1 TO 2 PUFFS BY MOUTH EVERY 4 HOURS AS NEEDED FOR WHEEZING OR FOR SHORTNESS OF BREATH   amLODipine  (NORVASC ) 10 MG tablet Take 1 tablet (10 mg total) by mouth daily.   aspirin 81 MG tablet Take 81 mg by mouth.   atorvastatin  (LIPITOR) 40 MG tablet Take 1 tablet (40 mg total) by mouth daily.   B-D UF III MINI PEN NEEDLES 31G X 5 MM MISC    benzonatate  (TESSALON ) 100 MG capsule Take 1 capsule (100 mg total) by mouth 2 (two) times daily as needed for cough.   bisoprolol  (ZEBETA ) 5 MG tablet TAKE ONE TABLET BY MOUTH TWICE DAILY   budesonide-glycopyrrolate -formoterol  (BREZTRI  AEROSPHERE) 160-9-4.8 MCG/ACT AERO inhaler Inhale 2 puffs into the lungs 2 (two) times daily.   dicyclomine  (BENTYL ) 10 MG capsule TAKE ONE CAPSULE BY MOUTH THREE TIMES DAILY AS NEEDED FOR MUSCLE SPASMS   Dulaglutide 0.75 MG/0.5ML SOPN Inject 3 mg into the skin once a week.   Dupilumab (DUPIXENT) 300 MG/2ML SOAJ Inject 300 mg into the skin every 14 (fourteen) days.   glucose blood (ONETOUCH VERIO) test strip    insulin degludec (TRESIBA) 100 UNIT/ML FlexTouch Pen Inject  26 Units into the skin daily.   losartan -hydrochlorothiazide (HYZAAR) 100-12.5 MG tablet Take 1 tablet by mouth daily.   pantoprazole  (PROTONIX ) 20 MG tablet TAKE ONE TABLET BY MOUTH ONCE DAILY   sildenafil  (VIAGRA ) 100 MG tablet Take 1 tablet (100 mg total) by mouth as needed for erectile dysfunction.   Facility-Administered Encounter Medications as of 01/08/2024  Medication   ipratropium-albuterol  (DUONEB) 0.5-2.5 (3) MG/3ML nebulizer solution 3 mL     Immunization History  Administered Date(s) Administered   Fluad Quad(high Dose 65+) 12/26/2021   INFLUENZA, HIGH DOSE SEASONAL PF 11/28/2023   Influenza,inj,Quad PF,6+ Mos 12/02/2014, 12/02/2017   Influenza,inj,quad, With Preservative 01/05/2019   Influenza-Unspecified 12/30/2019, 11/24/2020, 11/25/2022   Moderna Sars-Covid-2 Vaccination 03/08/2020   PNEUMOCOCCAL CONJUGATE-20 09/01/2020   Pneumococcal Conjugate-13 06/30/2013, 12/02/2014   Pneumococcal Polysaccharide-23 02/27/2008   Tdap 02/03/2010, 09/25/2021   Zoster Recombinant(Shingrix ) 12/02/2017     PFTs    Latest Ref Rng & Units 08/17/2021    9:55 AM  PFT Results  FVC-Pre L 2.35   FVC-Predicted Pre % 61   FVC-Post L 2.33   FVC-Predicted Post % 61   Pre FEV1/FVC % % 38   Post FEV1/FCV % % 41   FEV1-Pre L 0.88   FEV1-Predicted Pre % 30   FEV1-Post L 0.95   DLCO uncorrected ml/min/mmHg 11.19   DLCO UNC% % 42   DLVA Predicted % 50   TLC L 5.75   TLC % Predicted % 84  RV % Predicted % 113      Eosinophils Most recent blood eosinophil count was 300 cells/microL taken on 02/23/2021.   Assessment   Biologics training for dupilumab (Dupixent)  Goals of therapy: Mechanism: human monoclonal IgG4 antibody that inhibits interleukin-4 and interleukin-13 cytokine-induced responses, including release of proinflammatory cytokines, chemokines, and IgE Reviewed that Dupixent is add-on medication and patient must continue maintenance inhaler regimen. Response to therapy:  may take 4 months to determine efficacy. Discussed that patients generally feel improvement sooner than 4 months.  Side effects: injection site reaction (6-18%), antibody development (5-16%), ophthalmic conjunctivitis (2-16%), transient blood eosinophilia (1-2%)  Dose: 300mg  every 14 days (for COPD)  Administration/Storage:  Reviewed administration sites of thigh or abdomen (at least 2-3 inches away from abdomen). Reviewed the upper arm is only appropriate if caregiver is administering injection  Do not shake pen/syringe as this could lead to product foaming or precipitation. Do not use if solution is discolored or contains particulate matter or if window on prefilled pen is yellow (indicates pen has been used).  Reviewed storage of medication in refrigerator. Reviewed that Dupixent can be stored at room temperature in unopened carton for up to 14 days.  Access: Approval of Dupixent through: insurance  Patient self-administered Dupixent 300mg /30ml x 1 (total dose 300mg ) in right lower abdomen using sample. Patient was sent home with remaining pen (Dupixent 300mg /34mL autoinjector, quantity 2mL) in sample box.  Dupixent 300mg /26mL autoinjector pen NDC: [id number] Lot: [id number] Expiration: 2025-11-23  Patient monitored for 30 minutes for adverse reaction.  Patient tolerated well. Patient denies itchiness and irritation at injection.  PLAN Continue Dupixent 300mg  every 14 days.  Next dose is due 01/22/24 and every 14 days thereafter. Rx sent to: [facility]: [phone] .  Patient provided with pharmacy phone number.  Continue maintenance inhaler regimen of: Breztri  160-9-4.8 mcg/act (Inhale 2 puffs into the lungs 2 (two) times daily)  All questions encouraged and answered.  Instructed patient to reach out with any further questions or concerns.  Thank you for allowing pharmacy to participate in this patient's care.  This appointment required 45 minutes of patient care  (this includes precharting, chart review, review of results, face-to-face care, etc.).

## 2024-01-10 ENCOUNTER — Other Ambulatory Visit: Payer: Self-pay

## 2024-01-10 DIAGNOSIS — E1122 Type 2 diabetes mellitus with diabetic chronic kidney disease: Secondary | ICD-10-CM

## 2024-01-14 ENCOUNTER — Encounter

## 2024-01-14 DIAGNOSIS — B351 Tinea unguium: Secondary | ICD-10-CM | POA: Diagnosis not present

## 2024-01-14 DIAGNOSIS — E119 Type 2 diabetes mellitus without complications: Secondary | ICD-10-CM | POA: Diagnosis not present

## 2024-01-14 DIAGNOSIS — R0602 Shortness of breath: Secondary | ICD-10-CM | POA: Diagnosis not present

## 2024-01-14 DIAGNOSIS — B353 Tinea pedis: Secondary | ICD-10-CM | POA: Diagnosis not present

## 2024-01-14 DIAGNOSIS — J449 Chronic obstructive pulmonary disease, unspecified: Secondary | ICD-10-CM

## 2024-01-14 LAB — GLUCOSE, CAPILLARY
Glucose-Capillary: 202 mg/dL — ABNORMAL HIGH (ref 70–99)
Glucose-Capillary: 80 mg/dL (ref 70–99)

## 2024-01-14 NOTE — Progress Notes (Signed)
 Daily Session Note  Patient Details  Name: Zachary Wallace MRN: [id number] Date of Birth: Dec 25, 1955 Referring Provider:   Conrad Ports Pulmonary Rehab from 01/07/2024 in [facility] Cardiac and Pulmonary Rehab  Referring Provider Dr. Darrin Barn, MD    Encounter Date: 01/14/2024  Check In:  Session Check In - 01/14/24 0915       Check-In   Supervising physician immediately available to respond to emergencies See telemetry face sheet for immediately available ER MD    Location [facility]-Cardiac & Pulmonary Rehab    Staff Present [facility];Karna Serve PhD, RN,CNS,CEN;Maxon [facility], Exercise Physiologist;Jason Elnor [facility]    Virtual Visit No    Medication changes reported     No    Fall or balance concerns reported    No    Warm-up and Cool-down Performed on first and last piece of equipment    Resistance Training Performed Yes    VAD Patient? No    PAD/SET Patient? No      Pain Assessment   Currently in Pain? No/denies             Social History   Tobacco Use  Smoking Status Former   Current packs/day: 0.00   Average packs/day: 1 pack/day for 41.0 years (41.0 ttl pk-yrs)   Types: Cigarettes   Start date: 03/13/1981   Quit date: 03/13/2022   Years since quitting: 1.8   Passive exposure: Past  Smokeless Tobacco Never  Tobacco Comments   Smoked last on 12/11.  Trying to quit.   Using patches.  HFB  03/13/2022    Goals Met:  Proper associated with RPD/PD & O2 Sat Independence with exercise equipment Using PLB without cueing & demonstrates good technique Exercise tolerated well No report of concerns or symptoms today Strength training completed today  Goals Unmet:  Not Applicable  Comments: First full day of exercise!  Patient was oriented to gym and equipment including functions, settings, policies, and procedures.  Patient's individual exercise prescription and treatment plan were reviewed.  All starting workloads were established based on  the results of the 6 minute walk test done at initial orientation visit.  The plan for exercise progression was also introduced and progression will be customized based on patient's performance and goals.    Dr. Oneil Pinal is Medical Director for [facility] Cardiac Rehabilitation.  Dr. Fuad Aleskerov is Medical Director for [facility] Pulmonary Rehabilitation.

## 2024-01-15 ENCOUNTER — Encounter: Payer: Self-pay | Admitting: *Deleted

## 2024-01-15 DIAGNOSIS — J449 Chronic obstructive pulmonary disease, unspecified: Secondary | ICD-10-CM

## 2024-01-15 NOTE — Progress Notes (Signed)
 Pulmonary Individual Treatment Plan  Patient Details  Name: Zachary Wallace MRN: [id number] Date of Birth: [birth year] Referring Provider:   Flowsheet Row Pulmonary Rehab from 01/07/2024 in [facility] Cardiac and Pulmonary Rehab  Referring Provider Dr. Darrin Barn, MD    Initial Encounter Date:  Flowsheet Row Pulmonary Rehab from 01/07/2024 in [facility] Cardiac and Pulmonary Rehab  Date 01/07/24    Visit Diagnosis: Chronic obstructive pulmonary disease, unspecified COPD type (HCC)  Patient's Home Medications on Admission:  Current Outpatient Medications:    albuterol  (PROVENTIL ) (2.5 MG/3ML) 0.083% nebulizer solution, USE ONE ampule via NEBULIZER EVERY 6 HOURS AS NEEDED SHORTNESS OF BREATH OR wheezing, Disp: 180 mL, Rfl: 3   albuterol  (VENTOLIN  HFA) 108 (90 Base) MCG/ACT inhaler, INHALE 1 TO 2 PUFFS BY MOUTH EVERY 4 HOURS AS NEEDED FOR WHEEZING OR FOR SHORTNESS OF BREATH, Disp: 18 g, Rfl: 3   amLODipine  (NORVASC ) 10 MG tablet, Take 1 tablet (10 mg total) by mouth daily., Disp: 90 tablet, Rfl: 1   aspirin 81 MG tablet, Take 81 mg by mouth., Disp: , Rfl:    atorvastatin  (LIPITOR) 40 MG tablet, Take 1 tablet (40 mg total) by mouth daily., Disp: 90 tablet, Rfl: 1   B-D UF III MINI PEN NEEDLES 31G X 5 MM MISC, , Disp: , Rfl:    benzonatate  (TESSALON ) 100 MG capsule, Take 1 capsule (100 mg total) by mouth 2 (two) times daily as needed for cough., Disp: 20 capsule, Rfl: 0   bisoprolol  (ZEBETA ) 5 MG tablet, TAKE ONE TABLET BY MOUTH TWICE DAILY, Disp: 60 tablet, Rfl: 2   budesonide-glycopyrrolate -formoterol  (BREZTRI  AEROSPHERE) 160-9-4.8 MCG/ACT AERO inhaler, Inhale 2 puffs into the lungs 2 (two) times daily., Disp: 10.7 g, Rfl: 11   dicyclomine  (BENTYL ) 10 MG capsule, TAKE ONE CAPSULE BY MOUTH THREE TIMES DAILY AS NEEDED FOR MUSCLE SPASMS, Disp: 270 capsule, Rfl: 0   Dulaglutide 0.75 MG/0.5ML SOPN, Inject 3 mg into the skin once a week., Disp: , Rfl:    Dupilumab (DUPIXENT) 300 MG/2ML SOAJ, Inject 300  mg into the skin every 14 (fourteen) days., Disp: 4 mL, Rfl: 3   glucose blood (ONETOUCH VERIO) test strip, , Disp: , Rfl:    insulin degludec (TRESIBA) 100 UNIT/ML FlexTouch Pen, Inject 26 Units into the skin daily., Disp: , Rfl:    losartan -hydrochlorothiazide (HYZAAR) 100-12.5 MG tablet, Take 1 tablet by mouth daily., Disp: 90 tablet, Rfl: 1   pantoprazole  (PROTONIX ) 20 MG tablet, TAKE ONE TABLET BY MOUTH ONCE DAILY, Disp: 90 tablet, Rfl: 1   sildenafil  (VIAGRA ) 100 MG tablet, Take 1 tablet (100 mg total) by mouth as needed for erectile dysfunction., Disp: 30 tablet, Rfl: 5  Current Facility-Administered Medications:    ipratropium-albuterol  (DUONEB) 0.5-2.5 (3) MG/3ML nebulizer solution 3 mL, 3 mL, Nebulization, Once,   Past Medical History: Past Medical History:  Diagnosis Date   COPD (chronic obstructive pulmonary disease) (HCC)    Diabetes mellitus without complication (HCC)    Hyperlipidemia    Hypertension    OSA (obstructive sleep apnea)     Tobacco Use: Social History   Tobacco Use  Smoking Status Former   Current packs/day: 0.00   Average packs/day: 1 pack/day for 41.0 years (41.0 ttl pk-yrs)   Types: Cigarettes   Start date: 03/13/1981   Quit date: 03/13/2022   Years since quitting: 1.8   Passive exposure: Past  Smokeless Tobacco Never  Tobacco Comments   Smoked last on 12/11.  Trying to quit.   Using patches.  HFB  03/13/2022    Labs: Review Flowsheet  More data exists      Latest Ref Rng & Units 06/28/2022 07/02/2022 11/23/2022 05/24/2023 11/28/2023  Labs for ITP Cardiac and Pulmonary Rehab  Cholestrol 0 - 200 mg/dL - 893  - - -  LDL (calc) 0 - 99 mg/dL - 43  - - -  HDL-C >59 mg/dL - 41  - - -  Trlycerides <150 mg/dL - 888  - - -  Hemoglobin A1c 4.0 - 5.6 % 9.4  - 6.9     7.6     7.4     Details       This result is from an external source.          Pulmonary Assessment Scores:  Pulmonary Assessment Scores     Row Name 01/07/24 0941         ADL  UCSD   ADL Phase Entry     SOB Score total 86     Rest 3     Walk 3     Stairs 3     Bath 5     Dress 4     Shop 2       CAT Score   CAT Score 21       mMRC Score   mMRC Score 2        UCSD: Self-administered rating of dyspnea associated with activities of daily living (ADLs) 6-point scale (0 = not at all to 5 = maximal or unable to do because of breathlessness)  Scoring Scores range from 0 to 120.  Minimally important difference is 5 units  CAT: CAT can identify the health impairment of COPD patients and is better correlated with disease progression.  CAT has a scoring range of zero to 40. The CAT score is classified into four groups of low (less than 10), medium (10 - 20), high (21-30) and very high (31-40) based on the impact level of disease on health status. A CAT score over 10 suggests significant symptoms.  A worsening CAT score could be explained by an exacerbation, poor medication adherence, poor inhaler technique, or progression of COPD or comorbid conditions.  CAT MCID is 2 points  mMRC: mMRC (Modified Medical Research Council) Dyspnea Scale is used to assess the degree of baseline functional disability in patients of respiratory disease due to dyspnea. No minimal important difference is established. A decrease in score of 1 point or greater is considered a positive change.   Pulmonary Function Assessment:   Exercise Target Goals: Exercise Program Goal: Individual exercise prescription set using results from initial 6 min walk test and THRR while considering  patient's activity barriers and safety.   Exercise Prescription Goal: Initial exercise prescription builds to 30-45 minutes a day of aerobic activity, 2-3 days per week.  Home exercise guidelines will be given to patient during program as part of exercise prescription that the participant will acknowledge.  Education: Aerobic Exercise: - Group verbal and visual presentation on the components of exercise  prescription. Introduces F.I.T.T principle from ACSM for exercise prescriptions.  Reviews F.I.T.T. principles of aerobic exercise including progression. Written material provided at class time.   Education: Resistance Exercise: - Group verbal and visual presentation on the components of exercise prescription. Introduces F.I.T.T principle from ACSM for exercise prescriptions  Reviews F.I.T.T. principles of resistance exercise including progression. Written material provided at class time.    Education: Exercise & Equipment Safety: - Individual verbal instruction and demonstration of  equipment use and safety with use of the equipment. Flowsheet Row Pulmonary Rehab from 01/07/2024 in [facility] Cardiac and Pulmonary Rehab  Date 01/07/24  Educator NT  Instruction Review Code 1- Verbalizes Understanding    Education: Exercise Physiology & General Exercise Guidelines: - Group verbal and written instruction with models to review the exercise physiology of the cardiovascular system and associated critical values. Provides general exercise guidelines with specific guidelines to those with heart or lung disease.    Education: Flexibility, Balance, Mind/Body Relaxation: - Group verbal and visual presentation with interactive activity on the components of exercise prescription. Introduces F.I.T.T principle from ACSM for exercise prescriptions. Reviews F.I.T.T. principles of flexibility and balance exercise training including progression. Also discusses the mind body connection.  Reviews various relaxation techniques to help reduce and manage stress (i.e. Deep breathing, progressive muscle relaxation, and visualization). Balance handout provided to take home. Written material provided at class time.   Activity Barriers & Risk Stratification:  Activity Barriers & Cardiac Risk Stratification - 12/25/23 1003       Activity Barriers & Cardiac Risk Stratification   Activity Barriers Left Knee Replacement           6 Minute Walk:  6 Minute Walk     Row Name 01/07/24 1007         6 Minute Walk   Phase Initial     Distance 1145 feet     Walk Time 6 minutes     # of Rest Breaks 0     MPH 2.17     METS 2.92     RPE 11     Perceived Dyspnea  3     VO2 Peak 10.22     Symptoms Yes (comment)     Comments SOB     Resting HR 95 bpm     Resting BP 112/68     Resting Oxygen Saturation  93 %     Exercise Oxygen Saturation  during 6 min walk 83 %     Max Ex. HR 105 bpm     Max Ex. BP 124/66     2 Minute Post BP 118/66       Interval HR   1 Minute HR 98     2 Minute HR 101     3 Minute HR 102     4 Minute HR 101     5 Minute HR 105     6 Minute HR 104     2 Minute Post HR 88     Interval Heart Rate? Yes       Interval Oxygen   Interval Oxygen? Yes     Baseline Oxygen Saturation % 93 %     1 Minute Oxygen Saturation % 86 %     1 Minute Liters of Oxygen 0 L  RA     2 Minute Oxygen Saturation % 85 %     2 Minute Liters of Oxygen 0 L     3 Minute Oxygen Saturation % 83 %     3 Minute Liters of Oxygen 0 L     4 Minute Oxygen Saturation % 84 %     4 Minute Liters of Oxygen 0 L     5 Minute Oxygen Saturation % 85 %     5 Minute Liters of Oxygen 0 L     6 Minute Oxygen Saturation % 84 %     6 Minute Liters of Oxygen 0 L  2 Minute Post Oxygen Saturation % 92 %     2 Minute Post Liters of Oxygen 0 L       Oxygen Initial Assessment:  Oxygen Initial Assessment - 12/25/23 1006       Home Oxygen   Home Oxygen Device None    Sleep Oxygen Prescription None    Home Exercise Oxygen Prescription None    Home Resting Oxygen Prescription None    Compliance with Home Oxygen Use Yes      Intervention   Short Term Goals To learn and understand importance of monitoring SPO2 with pulse oximeter and demonstrate accurate use of the pulse oximeter.;To learn and understand importance of maintaining oxygen saturations>88%;To learn and demonstrate proper pursed lip breathing techniques or other  breathing techniques. ;To learn and demonstrate proper use of respiratory medications    Long  Term Goals Verbalizes importance of monitoring SPO2 with pulse oximeter and return demonstration;Maintenance of O2 saturations>88%;Exhibits proper breathing techniques, such as pursed lip breathing or other method taught during program session;Compliance with respiratory medication;Demonstrates proper use of MDI's          Oxygen Re-Evaluation:  Oxygen Re-Evaluation     Row Name 01/14/24 0917             Home Oxygen   Home Oxygen Device None       Sleep Oxygen Prescription None       Home Exercise Oxygen Prescription None       Home Resting Oxygen Prescription None       Compliance with Home Oxygen Use Yes         Goals/Expected Outcomes   Short Term Goals To learn and understand importance of monitoring SPO2 with pulse oximeter and demonstrate accurate use of the pulse oximeter.;To learn and understand importance of maintaining oxygen saturations>88%;To learn and demonstrate proper pursed lip breathing techniques or other breathing techniques. ;To learn and demonstrate proper use of respiratory medications       Long  Term Goals Verbalizes importance of monitoring SPO2 with pulse oximeter and return demonstration;Maintenance of O2 saturations>88%;Exhibits proper breathing techniques, such as pursed lip breathing or other method taught during program session;Compliance with respiratory medication;Demonstrates proper use of MDI's       Comments Reviewed PLB technique with pt.  Talked about how it works and it's importance in maintaining their exercise saturations.       Goals/Expected Outcomes Short: Become more profiecient at using PLB. Long: Become independent at using PLB.          Oxygen Discharge (Final Oxygen Re-Evaluation):  Oxygen Re-Evaluation - 01/14/24 0917       Home Oxygen   Home Oxygen Device None    Sleep Oxygen Prescription None    Home Exercise Oxygen Prescription None     Home Resting Oxygen Prescription None    Compliance with Home Oxygen Use Yes      Goals/Expected Outcomes   Short Term Goals To learn and understand importance of monitoring SPO2 with pulse oximeter and demonstrate accurate use of the pulse oximeter.;To learn and understand importance of maintaining oxygen saturations>88%;To learn and demonstrate proper pursed lip breathing techniques or other breathing techniques. ;To learn and demonstrate proper use of respiratory medications    Long  Term Goals Verbalizes importance of monitoring SPO2 with pulse oximeter and return demonstration;Maintenance of O2 saturations>88%;Exhibits proper breathing techniques, such as pursed lip breathing or other method taught during program session;Compliance with respiratory medication;Demonstrates proper use of MDI's  Comments Reviewed PLB technique with pt.  Talked about how it works and it's importance in maintaining their exercise saturations.    Goals/Expected Outcomes Short: Become more profiecient at using PLB. Long: Become independent at using PLB.          Initial Exercise Prescription:  Initial Exercise Prescription - 01/07/24 1000       Date of Initial Exercise RX and Referring Provider   Date 01/07/24    Referring Provider Dr. Darrin Barn, MD      Oxygen   Maintain Oxygen Saturation 88% or higher      Treadmill   MPH 2    Grade 0    Minutes 15    METs 2.53      Recumbant Bike   Level 2    RPM 50    Watts 24    Minutes 15    METs 2.92      NuStep   Level 2   T6 nustep   SPM 80    Minutes 15    METs 2.92      T5 Nustep   Level 1    SPM 80    Minutes 15    METs 2.92      Prescription Details   Frequency (times per week) 2    Duration Progress to 30 minutes of continuous aerobic without signs/symptoms of physical distress      Intensity   THRR 40-80% of Max Heartrate 118-142    Ratings of Perceived Exertion 11-13    Perceived Dyspnea 0-4      Progression    Progression Continue to progress workloads to maintain intensity without signs/symptoms of physical distress.      Resistance Training   Training Prescription Yes    Weight 5 lb    Reps 10-15          Perform Capillary Blood Glucose checks as needed.  Exercise Prescription Changes:   Exercise Prescription Changes     Row Name 01/07/24 1000             Response to Exercise   Blood Pressure (Admit) 112/68       Blood Pressure (Exercise) 124/66       Blood Pressure (Exit) 118/66       Heart Rate (Admit) 95 bpm       Heart Rate (Exercise) 105 bpm       Heart Rate (Exit) 88 bpm       Oxygen Saturation (Admit) 93 %       Oxygen Saturation (Exercise) 83 %       Oxygen Saturation (Exit) 92 %       Rating of Perceived Exertion (Exercise) 11       Perceived Dyspnea (Exercise) 3       Symptoms SOB       Comments Results          Exercise Comments:   Exercise Comments     Row Name 01/14/24 0916           Exercise Comments First full day of exercise!  Patient was oriented to gym and equipment including functions, settings, policies, and procedures.  Patient's individual exercise prescription and treatment plan were reviewed.  All starting workloads were established based on the results of the 6 minute walk test done at initial orientation visit.  The plan for exercise progression was also introduced and progression will be customized based on patient's performance and goals.  Exercise Goals and Review:   Exercise Goals     Row Name 01/07/24 1010             Exercise Goals   Increase Physical Activity Yes       Intervention Provide advice, education, support and counseling about physical activity/exercise needs.;Develop an individualized exercise prescription for aerobic and resistive training based on initial evaluation findings, risk stratification, comorbidities and participant's personal goals.       Expected Outcomes Short Term: Attend rehab on a  regular basis to increase amount of physical activity.;Long Term: Add in home exercise to make exercise part of routine and to increase amount of physical activity.;Long Term: Exercising regularly at least 3-5 days a week.       Increase Strength and Stamina Yes       Intervention Provide advice, education, support and counseling about physical activity/exercise needs.;Develop an individualized exercise prescription for aerobic and resistive training based on initial evaluation findings, risk stratification, comorbidities and participant's personal goals.       Expected Outcomes Short Term: Perform resistance training exercises routinely during rehab and add in resistance training at home;Short Term: Increase workloads from initial exercise prescription for resistance, speed, and METs.;Long Term: Improve cardiorespiratory fitness, muscular endurance and strength as measured by increased METs and functional capacity ( )       Able to understand and use rate of perceived exertion (RPE) scale Yes       Intervention Provide education and explanation on how to use RPE scale       Expected Outcomes Short Term: Able to use RPE daily in rehab to express subjective intensity level;Long Term:  Able to use RPE to guide intensity level when exercising independently       Able to understand and use Dyspnea scale Yes       Intervention Provide education and explanation on how to use Dyspnea scale       Expected Outcomes Long Term: Able to use Dyspnea scale to guide intensity level when exercising independently;Short Term: Able to use Dyspnea scale daily in rehab to express subjective sense of shortness of breath during exertion       Knowledge and understanding of Target Heart Rate Range (THRR) Yes       Intervention Provide education and explanation of THRR including how the numbers were predicted and where they are located for reference       Expected Outcomes Short Term: Able to state/look up THRR;Long Term:  Able to use THRR to govern intensity when exercising independently;Short Term: Able to use daily as guideline for intensity in rehab       Able to check pulse independently Yes       Intervention Provide education and demonstration on how to check pulse in carotid and radial arteries.;Review the importance of being able to check your own pulse for safety during independent exercise       Expected Outcomes Short Term: Able to explain why pulse checking is important during independent exercise;Long Term: Able to check pulse independently and accurately       Understanding of Exercise Prescription Yes       Intervention Provide education, explanation, and written materials on patient's individual exercise prescription       Expected Outcomes Short Term: Able to explain program exercise prescription;Long Term: Able to explain home exercise prescription to exercise independently          Exercise Goals Re-Evaluation :  Exercise Goals Re-Evaluation  Row Name 01/14/24 0916             Exercise Goal Re-Evaluation   Exercise Goals Review Increase Physical Activity;Able to understand and use rate of perceived exertion (RPE) scale;Knowledge and understanding of Target Heart Rate Range (THRR);Understanding of Exercise Prescription;Increase Strength and Stamina;Able to understand and use Dyspnea scale;Able to check pulse independently       Comments Reviewed RPE and dyspnea scale, THR and program prescription with pt today.  Pt voiced understanding and was given a copy of goals to take home.       Expected Outcomes Short: Use RPE daily to regulate intensity. Long: Follow program prescription in THR.          Discharge Exercise Prescription (Final Exercise Prescription Changes):  Exercise Prescription Changes - 01/07/24 1000       Response to Exercise   Blood Pressure (Admit) 112/68    Blood Pressure (Exercise) 124/66    Blood Pressure (Exit) 118/66    Heart Rate (Admit) 95 bpm    Heart Rate  (Exercise) 105 bpm    Heart Rate (Exit) 88 bpm    Oxygen Saturation (Admit) 93 %    Oxygen Saturation (Exercise) 83 %    Oxygen Saturation (Exit) 92 %    Rating of Perceived Exertion (Exercise) 11    Perceived Dyspnea (Exercise) 3    Symptoms SOB    Comments Results          Nutrition:  Target Goals: Understanding of nutrition guidelines, daily intake of sodium 1500mg , cholesterol 200mg , calories 30% from fat and 7% or less from saturated fats, daily to have 5 or more servings of fruits and vegetables.  Education: Nutrition 1 -Group instruction provided by verbal, written material, interactive activities, discussions, models, and posters to present general guidelines for heart healthy nutrition including macronutrients, label reading, and promoting whole foods over processed counterparts. Education serves as Pensions consultant of discussion of heart healthy eating for all. Written material provided at class time.     Education: Nutrition 2 -Group instruction provided by verbal, written material, interactive activities, discussions, models, and posters to present general guidelines for heart healthy nutrition including sodium, cholesterol, and saturated fat. Providing guidance of habit forming to improve blood pressure, cholesterol, and body weight. Written material provided at class time.     Biometrics:  Pre Biometrics - 01/07/24 1011       Pre Biometrics   Height 5' 9 (1.753 m)    Weight 181 lb 1.6 oz (82.1 kg)    Waist Circumference 38.5 inches    Hip Circumference 37.5 inches    Waist to Hip Ratio 1.03 %    BMI (Calculated) 26.73    Single Leg Stand 18.1 seconds           Nutrition Therapy Plan and Nutrition Goals:  Nutrition Therapy & Goals - 01/07/24 0955       Nutrition Therapy   RD appointment deferred Yes      Intervention Plan   Intervention Prescribe, educate and counsel regarding individualized specific dietary modifications aiming towards targeted  core components such as weight, hypertension, lipid management, diabetes, heart failure and other comorbidities.    Expected Outcomes Short Term Goal: A plan has been developed with personal nutrition goals set during dietitian appointment.;Short Term Goal: Understand basic principles of dietary content, such as calories, fat, sodium, cholesterol and nutrients.;Long Term Goal: Adherence to prescribed nutrition plan.          Nutrition Assessments:  MEDIFICTS Score Key: >=70 Need to make dietary changes  40-70 Heart Healthy Diet <= 40 Therapeutic Level Cholesterol Diet  Flowsheet Row Pulmonary Rehab from 01/07/2024 in [facility] Cardiac and Pulmonary Rehab  Picture Your Plate Total Score on Admission 37   Picture Your Plate Scores: <59 Unhealthy dietary pattern with much room for improvement. 41-50 Dietary pattern unlikely to meet recommendations for good health and room for improvement. 51-60 More healthful dietary pattern, with some room for improvement.  >60 Healthy dietary pattern, although there may be some specific behaviors that could be improved.   Nutrition Goals Re-Evaluation:   Nutrition Goals Discharge (Final Nutrition Goals Re-Evaluation):   Psychosocial: Target Goals: Acknowledge presence or absence of significant depression and/or stress, maximize coping skills, provide positive support system. Participant is able to verbalize types and ability to use techniques and skills needed for reducing stress and depression.   Education: Stress, Anxiety, and Depression - Group verbal and visual presentation to define topics covered.  Reviews how body is impacted by stress, anxiety, and depression.  Also discusses healthy ways to reduce stress and to treat/manage anxiety and depression.  Written material provided at class time.   Education: Sleep Hygiene -Provides group verbal and written instruction about how sleep can affect your health.  Define sleep hygiene, discuss sleep cycles  and impact of sleep habits. Review good sleep hygiene tips.    Initial Review & Psychosocial Screening:  Initial Psych Review & Screening - 12/25/23 1014       Initial Review   Current issues with None Identified      Family Dynamics   Good Support System? Yes      Barriers   Psychosocial barriers to participate in program There are no identifiable barriers or psychosocial needs.      Screening Interventions   Interventions Encouraged to exercise;To provide support and resources with identified psychosocial needs;Provide feedback about the scores to participant    Expected Outcomes Short Term goal: Utilizing psychosocial counselor, staff and physician to assist with identification of specific Stressors or current issues interfering with healing process. Setting desired goal for each stressor or current issue identified.;Long Term Goal: Stressors or current issues are controlled or eliminated.;Short Term goal: Identification and review with participant of any Quality of Life or Depression concerns found by scoring the questionnaire.;Long Term goal: The participant improves quality of Life and PHQ9 Scores as seen by post scores and/or verbalization of changes          Quality of Life Scores:  Scores of 19 and below usually indicate a poorer quality of life in these areas.  A difference of  2-3 points is a clinically meaningful difference.  A difference of 2-3 points in the total score of the Quality of Life Index has been associated with significant improvement in overall quality of life, self-image, physical symptoms, and general health in studies assessing change in quality of life.  PHQ-9: Review Flowsheet  More data exists      01/07/2024 12/19/2023 05/28/2023 02/05/2023 12/13/2022  Depression screen PHQ 2/9  Decreased Interest 2 0 0 0 0  Down, Depressed, Hopeless 0 0 0 0 0  PHQ - 2 Score 2 0 0 0 0  Altered sleeping 0 0 - 0 -  Tired, decreased energy 3 0 - 0 -  Change in appetite  3 0 - 0 -  Feeling bad or failure about yourself  0 0 - 0 -  Trouble concentrating 0 0 - 0 -  Moving  slowly or fidgety/restless 0 0 - 0 -  Suicidal thoughts 0 0 - 0 -  PHQ-9 Score 8 0 - 0 -  Difficult doing work/chores Very difficult Not difficult at all - Not difficult at all -   Interpretation of Total Score  Total Score Depression Severity:  1-4 = Minimal depression, 5-9 = Mild depression, 10-14 = Moderate depression, 15-19 = Moderately severe depression, 20-27 = Severe depression   Psychosocial Evaluation and Intervention:  Psychosocial Evaluation - 12/25/23 1014       Psychosocial Evaluation & Interventions   Interventions Encouraged to exercise with the program and follow exercise prescription    Comments Mr. Terrance is coming to pulmonary rehab with COPD. He states his shortness of breath has gotten worse over the last year and is ready to try this program to see if it will help. 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 He does not have any stress concerns at this time.    Expected Outcomes Short: attend pulmonary rehab for education and exercise Long: develop and maintain positive self care habits    Continue Psychosocial Services  Follow up required by staff          Psychosocial Re-Evaluation:   Psychosocial Discharge (Final Psychosocial Re-Evaluation):   Education: Education Goals: Education classes will be provided on a weekly basis, covering required topics. Participant will state understanding/return demonstration of topics presented.  Learning Barriers/Preferences:  Learning Barriers/Preferences - 12/25/23 1009       Learning Barriers/Preferences   Learning Barriers None    Learning Preferences None          General Pulmonary Education Topics:  Infection Prevention: - Provides verbal and written material to individual with discussion of infection control including proper hand washing and proper equipment cleaning  during exercise session. Flowsheet Row Pulmonary Rehab from 01/07/2024 in [facility] Cardiac and Pulmonary Rehab  Date 01/07/24  Educator NT  Instruction Review Code 1- Verbalizes Understanding    Falls Prevention: - Provides verbal and written material to individual with discussion of falls prevention and safety. Flowsheet Row Pulmonary Rehab from 01/07/2024 in [facility] Cardiac and Pulmonary Rehab  Date 01/07/24  Educator NT  Instruction Review Code 1- Verbalizes Understanding    Chronic Lung Disease Review: - Group verbal instruction with posters, models, PowerPoint presentations and videos,  to review new updates, new respiratory medications, new advancements in procedures and treatments. Providing information on websites and 800 numbers for continued self-education. Includes information about supplement oxygen, available portable oxygen systems, continuous and intermittent flow rates, oxygen safety, concentrators, and Medicare reimbursement for oxygen. Explanation of Pulmonary Drugs, including class, frequency, complications, importance of spacers, rinsing mouth after steroid MDI's, and proper cleaning methods for nebulizers. Review of basic lung anatomy and physiology related to function, structure, and complications of lung disease. Review of risk factors. Discussion about methods for diagnosing sleep apnea and types of masks and machines for OSA. Includes a review of the use of types of environmental controls: home humidity, furnaces, filters, dust mite/pet prevention, HEPA vacuums. Discussion about weather changes, air quality and the benefits of nasal washing. Instruction on Warning signs, infection symptoms, calling MD promptly, preventive modes, and value of vaccinations. Review of effective airway clearance, coughing and/or vibration techniques. Emphasizing that all should Create an Action Plan. Written material provided at class time. Flowsheet Row Pulmonary Rehab from 01/07/2024 in [facility]  Cardiac and Pulmonary Rehab  Education need identified 01/07/24    AED/CPR: -  Group verbal and written instruction with the use of models to demonstrate the basic use of the AED with the basic ABC's of resuscitation.    Tests and Procedures:  - Group verbal and visual presentation and models provide information about basic cardiac anatomy and function. Reviews the testing methods done to diagnose heart disease and the outcomes of the test results. Describes the treatment choices: Medical Management, Angioplasty, or Coronary Bypass Surgery for treating various heart conditions including Myocardial Infarction, Angina, Valve Disease, and Cardiac Arrhythmias.  Written material provided at class time.   Medication Safety: - Group verbal and visual instruction to review commonly prescribed medications for heart and lung disease. Reviews the medication, class of the drug, and side effects. Includes the steps to properly store meds and maintain the prescription regimen.  Written material given at graduation.   Other: -Provides group and verbal instruction on various topics (see comments)   Knowledge Questionnaire Score:  Knowledge Questionnaire Score - 01/07/24 0954       Knowledge Questionnaire Score   Pre Score 13/18           Core Components/Risk Factors/Patient Goals at Admission:  Personal Goals and Risk Factors at Admission - 12/25/23 1008       Core Components/Risk Factors/Patient Goals on Admission    Weight Management Weight Maintenance;Yes    Intervention Weight Management: Provide education and appropriate resources to help participant work on and attain dietary goals.;Weight Management: Develop a combined nutrition and exercise program designed to reach desired caloric intake, while maintaining appropriate intake of nutrient and fiber, sodium and fats, and appropriate energy expenditure required for the weight goal.    Expected Outcomes Long Term: Adherence to nutrition and  physical activity/exercise program aimed toward attainment of established weight goal;Short Term: Continue to assess and modify interventions until short term weight is achieved;Weight Maintenance: Understanding of the daily nutrition guidelines, which includes 25-35% calories from fat, 7% or less cal from saturated fats, less than 200mg  cholesterol, less than 1.5gm of sodium, & 5 or more servings of fruits and vegetables daily;Understanding recommendations for meals to include 15-35% energy as protein, 25-35% energy from fat, 35-60% energy from carbohydrates, less than 200mg  of dietary cholesterol, 20-35 gm of total fiber daily;Understanding of distribution of calorie intake throughout the day with the consumption of 4-5 meals/snacks    Diabetes Yes    Intervention Provide education about signs/symptoms and action to take for hypo/hyperglycemia.;Provide education about proper nutrition, including hydration, and aerobic/resistive exercise prescription along with prescribed medications to achieve blood glucose in normal ranges: Fasting glucose 65-99 mg/dL    Expected Outcomes Short Term: Participant verbalizes understanding of the signs/symptoms and immediate care of hyper/hypoglycemia, proper foot care and importance of medication, aerobic/resistive exercise and nutrition plan for blood glucose control.;Long Term: Attainment of HbA1C < 7%.    Hypertension Yes    Intervention Provide education on lifestyle modifcations including regular physical activity/exercise, weight management, moderate sodium restriction and increased consumption of fresh fruit, vegetables, and low fat dairy, alcohol moderation, and smoking cessation.;Monitor prescription use compliance.    Expected Outcomes Short Term: Continued assessment and intervention until BP is < 140/87mm HG in hypertensive participants. < 130/80mm HG in hypertensive participants with diabetes, heart failure or chronic kidney disease.;Long Term: Maintenance of  blood pressure at goal levels.    Lipids Yes    Intervention Provide education and support for participant on nutrition & aerobic/resistive exercise along with prescribed medications to achieve LDL 70mg , HDL >40mg .  Expected Outcomes Short Term: Participant states understanding of desired cholesterol values and is compliant with medications prescribed. Participant is following exercise prescription and nutrition guidelines.;Long Term: Cholesterol controlled with medications as prescribed, with individualized exercise RX and with personalized nutrition plan. Value goals: LDL < 70mg , HDL > 40 mg.          Education:Diabetes - Individual verbal and written instruction to review signs/symptoms of diabetes, desired ranges of glucose level fasting, after meals and with exercise. Acknowledge that pre and post exercise glucose checks will be done for 3 sessions at entry of program. Flowsheet Row Pulmonary Rehab from 01/07/2024 in [facility] Cardiac and Pulmonary Rehab  Date 12/25/23  Educator [facility]  Instruction Review Code 1- Verbalizes Understanding    Know Your Numbers and Heart Failure: - Group verbal and visual instruction to discuss disease risk factors for cardiac and pulmonary disease and treatment options.  Reviews associated critical values for Overweight/Obesity, Hypertension, Cholesterol, and Diabetes.  Discusses basics of heart failure: signs/symptoms and treatments.  Introduces Heart Failure Zone chart for action plan for heart failure. Written material provided at class time.   Core Components/Risk Factors/Patient Goals Review:    Core Components/Risk Factors/Patient Goals at Discharge (Final Review):    ITP Comments:  ITP Comments     Row Name 12/25/23 1016 01/07/24 0929 01/14/24 0916 01/15/24 0944     ITP Comments Initial phone call completed. Diagnosis can be found in [facility] 9/15. EP Orientation scheduled for Tuesday 10/14 at 8am. Completed and gym orientation for pulmonary  rehab. Initial ITP created and sent for review to Dr. Fuad Aleskerov, Medical Director. First full day of exercise!  Patient was oriented to gym and equipment including functions, settings, policies, and procedures.  Patient's individual exercise prescription and treatment plan were reviewed.  All starting workloads were established based on the results of the 6 minute walk test done at initial orientation visit.  The plan for exercise progression was also introduced and progression will be customized based on patient's performance and goals. 30 Day review completed. Medical Director ITP review done, changes made as directed, and signed approval by Medical Director.       Comments: 30 day review

## 2024-01-16 ENCOUNTER — Encounter: Admitting: Emergency Medicine

## 2024-01-16 DIAGNOSIS — J449 Chronic obstructive pulmonary disease, unspecified: Secondary | ICD-10-CM | POA: Diagnosis not present

## 2024-01-16 DIAGNOSIS — R0602 Shortness of breath: Secondary | ICD-10-CM | POA: Diagnosis not present

## 2024-01-16 LAB — GLUCOSE, CAPILLARY: Glucose-Capillary: 152 mg/dL — ABNORMAL HIGH (ref 70–99)

## 2024-01-16 NOTE — Progress Notes (Signed)
 Daily Session Note  Patient Details  Name: Zachary Wallace MRN: [id number] Date of Birth: Jul 21, 1955 Referring Provider:   Flowsheet Row Pulmonary Rehab from 01/07/2024 in [facility] Cardiac and Pulmonary Rehab  Referring Provider Dr. Darrin Barn, MD    Encounter Date: 01/16/2024  Check In:  Session Check In - 01/16/24 0947       Check-In   Supervising physician immediately available to respond to emergencies See telemetry face sheet for immediately available ER MD    Location [facility]-Cardiac & Pulmonary Rehab    Staff Present Rollene Paterson, MS, Exercise Physiologist;Maxon Conetta BS, Exercise Physiologist;Joseph Rolinda RCP,RRT,BSRT;Carnetta Losada RN,BSN    Virtual Visit No    Medication changes reported     No    Fall or balance concerns reported    No    Warm-up and Cool-down Performed on first and last piece of equipment    Resistance Training Performed Yes    VAD Patient? No    PAD/SET Patient? No      Pain Assessment   Currently in Pain? No/denies             Social History   Tobacco Use  Smoking Status Former   Current packs/day: 0.00   Average packs/day: 1 pack/day for 41.0 years (41.0 ttl pk-yrs)   Types: Cigarettes   Start date: 03/13/1981   Quit date: 03/13/2022   Years since quitting: 1.8   Passive exposure: Past  Smokeless Tobacco Never  Tobacco Comments   Smoked last on 12/11.  Trying to quit.   Using patches.  HFB  03/13/2022    Goals Met:  Proper associated with RPD/PD & O2 Sat Independence with exercise equipment Using PLB without cueing & demonstrates good technique Exercise tolerated well No report of concerns or symptoms today Strength training completed today  Goals Unmet:  Not Applicable  Comments: Pt able to follow exercise prescription today without complaint.  Will continue to monitor for progression.    Dr. Oneil Pinal is Medical Director for [facility] Cardiac Rehabilitation.  Dr. Fuad Aleskerov is Medical Director for [facility]  Pulmonary Rehabilitation.

## 2024-01-17 ENCOUNTER — Other Ambulatory Visit: Payer: Self-pay | Admitting: Internal Medicine

## 2024-01-17 DIAGNOSIS — E1159 Type 2 diabetes mellitus with other circulatory complications: Secondary | ICD-10-CM | POA: Diagnosis not present

## 2024-01-17 DIAGNOSIS — E1165 Type 2 diabetes mellitus with hyperglycemia: Secondary | ICD-10-CM | POA: Diagnosis not present

## 2024-01-17 DIAGNOSIS — I152 Hypertension secondary to endocrine disorders: Secondary | ICD-10-CM | POA: Diagnosis not present

## 2024-01-17 DIAGNOSIS — Z794 Long term (current) use of insulin: Secondary | ICD-10-CM | POA: Diagnosis not present

## 2024-01-17 DIAGNOSIS — J42 Unspecified chronic bronchitis: Secondary | ICD-10-CM

## 2024-01-17 DIAGNOSIS — E559 Vitamin D deficiency, unspecified: Secondary | ICD-10-CM | POA: Diagnosis not present

## 2024-01-17 DIAGNOSIS — E1169 Type 2 diabetes mellitus with other specified complication: Secondary | ICD-10-CM | POA: Diagnosis not present

## 2024-01-17 DIAGNOSIS — E785 Hyperlipidemia, unspecified: Secondary | ICD-10-CM | POA: Diagnosis not present

## 2024-01-18 NOTE — Telephone Encounter (Signed)
 Too soon for refill.  Requested Prescriptions  Pending Prescriptions Disp Refills   albuterol  (VENTOLIN  HFA) 108 (90 Base) MCG/ACT inhaler [Pharmacy Med Name: albuterol  sulfate HFA 90 mcg/actuation aerosol inhaler] 8.5 g 3    Sig: INHALE 1 TO 2 PUFFS BY MOUTH EVERY 4 HOURS AS NEEDED FOR WHEEZING OR FOR SHORTNESS OF BREATH     Pulmonology:  Beta Agonists 2 Passed - 01/18/2024 10:55 AM      Passed - Last BP in normal range    BP Readings from Last 1 Encounters:  12/09/23 128/78         Passed - Last Heart Rate in normal range    Pulse Readings from Last 1 Encounters:  12/09/23 78         Passed - Valid encounter within last 12 months    Recent Outpatient Visits           1 month ago Essential hypertension   [facility] Bernardo Fend, DO   2 months ago COPD exacerbation [facility]   [facility] Gareth Mliss FALCON, FNP   3 months ago COPD exacerbation [facility])   [facility] Bernardo Fend, DO   5 months ago COPD exacerbation [facility])   [facility] Bernardo Fend, DO   6 months ago Bilateral impacted cerumen   [facility] Bernardo Fend, DO       Future Appointments             In 1 month Bernardo Fend, DO [facility] [place]

## 2024-01-21 ENCOUNTER — Encounter

## 2024-01-21 DIAGNOSIS — R0602 Shortness of breath: Secondary | ICD-10-CM | POA: Diagnosis not present

## 2024-01-21 DIAGNOSIS — J449 Chronic obstructive pulmonary disease, unspecified: Secondary | ICD-10-CM

## 2024-01-21 LAB — GLUCOSE, CAPILLARY
Glucose-Capillary: 150 mg/dL — ABNORMAL HIGH (ref 70–99)
Glucose-Capillary: 95 mg/dL (ref 70–99)

## 2024-01-21 NOTE — Progress Notes (Signed)
 Daily Session Note  Patient Details  Name: Zachary Wallace MRN: [id number] Date of Birth: 18-Jun-1955 Referring Provider:   Flowsheet Row Pulmonary Rehab from 01/07/2024 in [facility] Cardiac and Pulmonary Rehab  Referring Provider Dr. Darrin Barn, MD    Encounter Date: 01/21/2024  Check In:  Session Check In - 01/21/24 0906       Check-In   Supervising physician immediately available to respond to emergencies See telemetry face sheet for immediately available ER MD    Location [facility]-Cardiac & Pulmonary Rehab    Staff Present Burnard Davenport RN,BSN,MPA;[name] BS, Exercise Physiologist;Margaret Best, MS, Exercise Physiologist;Jason Elnor RDN,LDN    Virtual Visit No    Medication changes reported     No    Fall or balance concerns reported    No    Warm-up and Cool-down Performed on first and last piece of equipment    Resistance Training Performed Yes    VAD Patient? No    PAD/SET Patient? No      Pain Assessment   Currently in Pain? No/denies             Social History   Tobacco Use  Smoking Status Former   Current packs/day: 0.00   Average packs/day: 1 pack/day for 41.0 years (41.0 ttl pk-yrs)   Types: Cigarettes   Start date: 03/13/1981   Quit date: 03/13/2022   Years since quitting: 1.8   Passive exposure: Past  Smokeless Tobacco Never  Tobacco Comments   Smoked last on 12/11.  Trying to quit.   Using patches.  HFB  03/13/2022    Goals Met:  Proper associated with RPD/PD & O2 Sat Independence with exercise equipment Using PLB without cueing & demonstrates good technique Exercise tolerated well No report of concerns or symptoms today Strength training completed today  Goals Unmet:  Not Applicable  Comments: Pt able to follow exercise prescription today without complaint.  Will continue to monitor for progression.    Dr. Oneil Pinal is Medical Director for [facility] Cardiac Rehabilitation.  Dr. Fuad Aleskerov is Medical Director for [facility]  Pulmonary Rehabilitation.

## 2024-01-23 ENCOUNTER — Encounter

## 2024-01-27 ENCOUNTER — Other Ambulatory Visit: Payer: Self-pay

## 2024-01-28 ENCOUNTER — Encounter: Attending: Pulmonary Disease | Admitting: *Deleted

## 2024-01-28 DIAGNOSIS — J449 Chronic obstructive pulmonary disease, unspecified: Secondary | ICD-10-CM | POA: Insufficient documentation

## 2024-01-28 NOTE — Progress Notes (Signed)
 Daily Session Note  Patient Details  Name: DAERON CARRENO MRN: [id number] Date of Birth: 1955-05-15 Referring Provider:   Flowsheet Row Pulmonary Rehab from 01/07/2024 in [facility] Cardiac and Pulmonary Rehab  Referring Provider Dr. Darrin Barn, MD    Encounter Date: 01/28/2024  Check In:      Social History   Tobacco Use  Smoking Status Former   Current packs/day: 0.00   Average packs/day: 1 pack/day for 41.0 years (41.0 ttl pk-yrs)   Types: Cigarettes   Start date: 03/13/1981   Quit date: 03/13/2022   Years since quitting: 1.8   Passive exposure: Past  Smokeless Tobacco Never  Tobacco Comments   Smoked last on 12/11.  Trying to quit.   Using patches.  HFB  03/13/2022    Goals Met:  Independence with exercise equipment Exercise tolerated well No report of concerns or symptoms today  Goals Unmet:  Not Applicable  Comments: .exgoo   Dr. Oneil Pinal is Medical Director for [facility] Cardiac Rehabilitation.  Dr. Fuad Aleskerov is Medical Director for [facility] Pulmonary Rehabilitation.

## 2024-01-29 ENCOUNTER — Other Ambulatory Visit: Payer: Self-pay

## 2024-01-30 ENCOUNTER — Encounter: Admitting: Emergency Medicine

## 2024-01-30 DIAGNOSIS — J449 Chronic obstructive pulmonary disease, unspecified: Secondary | ICD-10-CM | POA: Diagnosis not present

## 2024-01-30 NOTE — Progress Notes (Signed)
 Daily Session Note  Patient Details  Name: Zachary Wallace MRN: [id number] Date of Birth: June 20, 1955 Referring Provider:   Flowsheet Row Pulmonary Rehab from 01/07/2024 in [facility] Cardiac and Pulmonary Rehab  Referring Provider Dr. Darrin Barn, MD    Encounter Date: 01/30/2024  Check In:  Session Check In - 01/30/24 0949       Check-In   Supervising physician immediately available to respond to emergencies See telemetry face sheet for immediately available ER MD    Location [facility]-Cardiac & Pulmonary Rehab    Staff Present Leita Franks RN,BSN;Joseph [facility] BS, Exercise Physiologist;Mary Godley, RN, DNP, NE-BC    Virtual Visit No    Medication changes reported     No    Fall or balance concerns reported    No    Tobacco Cessation No Change    Warm-up and Cool-down Performed on first and last piece of equipment    Resistance Training Performed Yes    VAD Patient? No    PAD/SET Patient? No      Pain Assessment   Currently in Pain? No/denies             Social History   Tobacco Use  Smoking Status Former   Current packs/day: 0.00   Average packs/day: 1 pack/day for 41.0 years (41.0 ttl pk-yrs)   Types: Cigarettes   Start date: 03/13/1981   Quit date: 03/13/2022   Years since quitting: 1.8   Passive exposure: Past  Smokeless Tobacco Never  Tobacco Comments   Smoked last on 12/11.  Trying to quit.   Using patches.  HFB  03/13/2022    Goals Met:  Proper associated with RPD/PD & O2 Sat Independence with exercise equipment Using PLB without cueing & demonstrates good technique Exercise tolerated well No report of concerns or symptoms today Strength training completed today  Goals Unmet:  Not Applicable  Comments: Pt able to follow exercise prescription today without complaint.  Will continue to monitor for progression.    Dr. Oneil Pinal is Medical Director for [facility] Cardiac Rehabilitation.  Dr. Fuad Aleskerov is Medical  Director for [facility] Pulmonary Rehabilitation.

## 2024-01-31 ENCOUNTER — Other Ambulatory Visit (HOSPITAL_COMMUNITY): Payer: Self-pay

## 2024-02-04 ENCOUNTER — Encounter

## 2024-02-04 DIAGNOSIS — J449 Chronic obstructive pulmonary disease, unspecified: Secondary | ICD-10-CM | POA: Diagnosis not present

## 2024-02-04 NOTE — Progress Notes (Signed)
 Daily Session Note  Patient Details  Name: Zachary Wallace MRN: [id number] Date of Birth: Aug 02, 1955 Referring Provider:   Flowsheet Row Pulmonary Rehab from 01/07/2024 in [facility] Cardiac and Pulmonary Rehab  Referring Provider Dr. Darrin Barn, MD    Encounter Date: 02/04/2024  Check In:  Session Check In - 02/04/24 0915       Check-In   Supervising physician immediately available to respond to emergencies See telemetry face sheet for immediately available ER MD    Location [facility]-Cardiac & Pulmonary Rehab    Staff Present Selinda Pereyra RDN,LDN;Margaret Best, MS, Exercise Physiologist;Maxon Conetta BS, Exercise Physiologist;Shina Wass [facility]    Virtual Visit No    Medication changes reported     No    Fall or balance concerns reported    No    Tobacco Cessation No Change    Warm-up and Cool-down Performed on first and last piece of equipment    Resistance Training Performed Yes    VAD Patient? No    PAD/SET Patient? No      Pain Assessment   Currently in Pain? No/denies             Social History   Tobacco Use  Smoking Status Former   Current packs/day: 0.00   Average packs/day: 1 pack/day for 41.0 years (41.0 ttl pk-yrs)   Types: Cigarettes   Start date: 03/13/1981   Quit date: 03/13/2022   Years since quitting: 1.8   Passive exposure: Past  Smokeless Tobacco Never  Tobacco Comments   Smoked last on 12/11.  Trying to quit.   Using patches.  HFB  03/13/2022    Goals Met:  Proper associated with RPD/PD & O2 Sat Independence with exercise equipment Using PLB without cueing & demonstrates good technique Exercise tolerated well No report of concerns or symptoms today Strength training completed today  Goals Unmet:  Not Applicable  Comments: Pt able to follow exercise prescription today without complaint.  Will continue to monitor for progression.    Dr. Oneil Pinal is Medical Director for [facility] Cardiac Rehabilitation.  Dr. Fuad Aleskerov is  Medical Director for [facility] Pulmonary Rehabilitation.

## 2024-02-06 ENCOUNTER — Encounter: Admitting: Emergency Medicine

## 2024-02-06 DIAGNOSIS — J449 Chronic obstructive pulmonary disease, unspecified: Secondary | ICD-10-CM

## 2024-02-06 NOTE — Progress Notes (Signed)
 Daily Session Note  Patient Details  Name: Zachary Wallace MRN: [id number] Date of Birth: [birth year] Referring Provider:   Flowsheet Row Pulmonary Rehab from 01/07/2024 in [facility] Cardiac and Pulmonary Rehab  Referring Provider Dr. Darrin Barn, MD    Encounter Date: 02/06/2024  Check In:  Session Check In - 02/06/24 0940       Check-In   Supervising physician immediately available to respond to emergencies See telemetry face sheet for immediately available ER MD    Location [facility]-Cardiac & Pulmonary Rehab    Staff Present Fairy Plater RCP,RRT,BSRT;Maxon Conetta BS, Exercise Physiologist;Adileny Delon RN,BSN;Noah Tickle, BS, Exercise Physiologist    Virtual Visit No    Medication changes reported     No    Fall or balance concerns reported    No    Tobacco Cessation No Change    Warm-up and Cool-down Performed on first and last piece of equipment    Resistance Training Performed Yes    VAD Patient? No    PAD/SET Patient? No      Pain Assessment   Currently in Pain? No/denies             Social History   Tobacco Use  Smoking Status Former   Current packs/day: 0.00   Average packs/day: 1 pack/day for 41.0 years (41.0 ttl pk-yrs)   Types: Cigarettes   Start date: 03/13/1981   Quit date: 03/13/2022   Years since quitting: 1.9   Passive exposure: Past  Smokeless Tobacco Never  Tobacco Comments   Smoked last on 12/11.  Trying to quit.   Using patches.  HFB  03/13/2022    Goals Met:  Proper associated with RPD/PD & O2 Sat Independence with exercise equipment Using PLB without cueing & demonstrates good technique Exercise tolerated well No report of concerns or symptoms today Strength training completed today  Goals Unmet:  Not Applicable  Comments: Pt able to follow exercise prescription today without complaint.  Will continue to monitor for progression.    Dr. Oneil Pinal is Medical Director for [facility] Cardiac Rehabilitation.  Dr. Fuad Aleskerov is  Medical Director for [facility] Pulmonary Rehabilitation.

## 2024-02-10 ENCOUNTER — Other Ambulatory Visit: Payer: Self-pay | Admitting: Internal Medicine

## 2024-02-10 DIAGNOSIS — J42 Unspecified chronic bronchitis: Secondary | ICD-10-CM

## 2024-02-11 ENCOUNTER — Encounter

## 2024-02-11 DIAGNOSIS — J449 Chronic obstructive pulmonary disease, unspecified: Secondary | ICD-10-CM | POA: Diagnosis not present

## 2024-02-11 NOTE — Telephone Encounter (Signed)
 Pt called to check on status of refill. I advised 48-72 hrs.

## 2024-02-11 NOTE — Progress Notes (Signed)
 Daily Session Note  Patient Details  Name: Zachary Wallace MRN: [id number] Date of Birth: [birth year] Referring Provider:   Flowsheet Row Pulmonary Rehab from 01/07/2024 in [facility] Cardiac and Pulmonary Rehab  Referring Provider Dr. Darrin Barn, MD    Encounter Date: 02/11/2024  Check In:  Session Check In - 02/11/24 0909       Check-In   Supervising physician immediately available to respond to emergencies See telemetry face sheet for immediately available ER MD    Location [facility]-Cardiac & Pulmonary Rehab    Staff Present Selinda Pereyra RDN,LDN;Margaret Best, MS, Exercise Physiologist;Maxon Burnell HECKLE, Exercise Physiologist;Sherrilyn Nairn RN,BSN,MPA;Mary Godley, RN, DNP, NE-BC    Virtual Visit No    Medication changes reported     No    Fall or balance concerns reported    No    Tobacco Cessation No Change    Warm-up and Cool-down Performed on first and last piece of equipment    Resistance Training Performed Yes    VAD Patient? No    PAD/SET Patient? No      Pain Assessment   Currently in Pain? No/denies             Social History   Tobacco Use  Smoking Status Former   Current packs/day: 0.00   Average packs/day: 1 pack/day for 41.0 years (41.0 ttl pk-yrs)   Types: Cigarettes   Start date: 03/13/1981   Quit date: 03/13/2022   Years since quitting: 1.9   Passive exposure: Past  Smokeless Tobacco Never  Tobacco Comments   Smoked last on 12/11.  Trying to quit.   Using patches.  HFB  03/13/2022    Goals Met:  Proper associated with RPD/PD & O2 Sat Independence with exercise equipment Using PLB without cueing & demonstrates good technique Exercise tolerated well No report of concerns or symptoms today Strength training completed today  Goals Unmet:  Not Applicable  Comments: Pt able to follow exercise prescription today without complaint.  Will continue to monitor for progression.    Dr. Oneil Pinal is Medical Director for [facility] Cardiac  Rehabilitation.  Dr. Fuad Aleskerov is Medical Director for [facility] Pulmonary Rehabilitation.

## 2024-02-12 ENCOUNTER — Encounter: Payer: Self-pay | Admitting: *Deleted

## 2024-02-12 DIAGNOSIS — J449 Chronic obstructive pulmonary disease, unspecified: Secondary | ICD-10-CM

## 2024-02-12 NOTE — Progress Notes (Signed)
 Pulmonary Individual Treatment Plan  Patient Details  Name: Zachary Wallace MRN: [id number] Date of Birth: 07/03/1955 Referring Provider:   Flowsheet Row Pulmonary Rehab from 01/07/2024 in [facility] Cardiac and Pulmonary Rehab  Referring Provider Dr. Darrin Barn, MD    Initial Encounter Date:  Flowsheet Row Pulmonary Rehab from 01/07/2024 in [facility] Cardiac and Pulmonary Rehab  Date 01/07/24    Visit Diagnosis: Chronic obstructive pulmonary disease, unspecified COPD type (HCC)  Patient's Home Medications on Admission:  Current Outpatient Medications:    albuterol  (PROVENTIL ) (2.5 MG/3ML) 0.083% nebulizer solution, USE ONE ampule via NEBULIZER EVERY 6 HOURS AS NEEDED SHORTNESS OF BREATH OR wheezing, Disp: 180 mL, Rfl: 3   albuterol  (VENTOLIN  HFA) 108 (90 Base) MCG/ACT inhaler, INHALE 1 TO 2 PUFFS BY MOUTH EVERY 4 HOURS AS NEEDED FOR WHEEZING OR FOR SHORTNESS OF BREATH, Disp: 8.5 g, Rfl: 1   amLODipine  (NORVASC ) 10 MG tablet, Take 1 tablet (10 mg total) by mouth daily., Disp: 90 tablet, Rfl: 1   aspirin 81 MG tablet, Take 81 mg by mouth., Disp: , Rfl:    atorvastatin  (LIPITOR) 40 MG tablet, Take 1 tablet (40 mg total) by mouth daily., Disp: 90 tablet, Rfl: 1   B-D UF III MINI PEN NEEDLES 31G X 5 MM MISC, , Disp: , Rfl:    benzonatate  (TESSALON ) 100 MG capsule, Take 1 capsule (100 mg total) by mouth 2 (two) times daily as needed for cough., Disp: 20 capsule, Rfl: 0   bisoprolol  (ZEBETA ) 5 MG tablet, TAKE ONE TABLET BY MOUTH TWICE DAILY, Disp: 60 tablet, Rfl: 2   budesonide-glycopyrrolate -formoterol  (BREZTRI  AEROSPHERE) 160-9-4.8 MCG/ACT AERO inhaler, Inhale 2 puffs into the lungs 2 (two) times daily., Disp: 10.7 g, Rfl: 11   dicyclomine  (BENTYL ) 10 MG capsule, TAKE ONE CAPSULE BY MOUTH THREE TIMES DAILY AS NEEDED FOR MUSCLE SPASMS, Disp: 270 capsule, Rfl: 0   Dulaglutide 0.75 MG/0.5ML SOPN, Inject 3 mg into the skin once a week., Disp: , Rfl:    Dupilumab (DUPIXENT) 300 MG/2ML SOAJ, Inject  300 mg into the skin every 14 (fourteen) days., Disp: 4 mL, Rfl: 3   glucose blood (ONETOUCH VERIO) test strip, , Disp: , Rfl:    insulin degludec (TRESIBA) 100 UNIT/ML FlexTouch Pen, Inject 26 Units into the skin daily., Disp: , Rfl:    losartan -hydrochlorothiazide (HYZAAR) 100-12.5 MG tablet, Take 1 tablet by mouth daily., Disp: 90 tablet, Rfl: 1   pantoprazole  (PROTONIX ) 20 MG tablet, TAKE ONE TABLET BY MOUTH ONCE DAILY, Disp: 90 tablet, Rfl: 1   sildenafil  (VIAGRA ) 100 MG tablet, Take 1 tablet (100 mg total) by mouth as needed for erectile dysfunction., Disp: 30 tablet, Rfl: 5  Current Facility-Administered Medications:    ipratropium-albuterol  (DUONEB) 0.5-2.5 (3) MG/3ML nebulizer solution 3 mL, 3 mL, Nebulization, Once,   Past Medical History: Past Medical History:  Diagnosis Date   COPD (chronic obstructive pulmonary disease) (HCC)    Diabetes mellitus without complication (HCC)    Hyperlipidemia    Hypertension    OSA (obstructive sleep apnea)     Tobacco Use: Social History   Tobacco Use  Smoking Status Former   Current packs/day: 0.00   Average packs/day: 1 pack/day for 41.0 years (41.0 ttl pk-yrs)   Types: Cigarettes   Start date: 03/13/1981   Quit date: 03/13/2022   Years since quitting: 1.9   Passive exposure: Past  Smokeless Tobacco Never  Tobacco Comments   Smoked last on 12/11.  Trying to quit.   Using patches.  [facility]FB  03/13/2022    Labs: Review Flowsheet  More data exists      Latest Ref Rng & Units 06/28/2022 07/02/2022 11/23/2022 05/24/2023 11/28/2023  Labs for ITP Cardiac and Pulmonary Rehab  Cholestrol 0 - 200 mg/dL - 893  - - -  LDL (calc) 0 - 99 mg/dL - 43  - - -  HDL-C >59 mg/dL - 41  - - -  Trlycerides <150 mg/dL - 888  - - -  Hemoglobin A1c 4.0 - 5.6 % 9.4  - 6.9     7.6     7.4     Details       This result is from an external source.          Pulmonary Assessment Scores:  Pulmonary Assessment Scores     Row Name 01/07/24 0941          ADL UCSD   ADL Phase Entry     SOB Score total 86     Rest 3     Walk 3     Stairs 3     Bath 5     Dress 4     Shop 2       CAT Score   CAT Score 21       mMRC Score   mMRC Score 2        UCSD: Self-administered rating of dyspnea associated with activities of daily living (ADLs) 6-point scale (0 = not at all to 5 = maximal or unable to do because of breathlessness)  Scoring Scores range from 0 to 120.  Minimally important difference is 5 units  CAT: CAT can identify the health impairment of COPD patients and is better correlated with disease progression.  CAT has a scoring range of zero to 40. The CAT score is classified into four groups of low (less than 10), medium (10 - 20), high (21-30) and very high (31-40) based on the impact level of disease on health status. A CAT score over 10 suggests significant symptoms.  A worsening CAT score could be explained by an exacerbation, poor medication adherence, poor inhaler technique, or progression of COPD or comorbid conditions.  CAT MCID is 2 points  mMRC: mMRC (Modified Medical Research Council) Dyspnea Scale is used to assess the degree of baseline functional disability in patients of respiratory disease due to dyspnea. No minimal important difference is established. A decrease in score of 1 point or greater is considered a positive change.   Pulmonary Function Assessment:   Exercise Target Goals: Exercise Program Goal: Individual exercise prescription set using results from initial 6 min walk test and THRR while considering  patient's activity barriers and safety.   Exercise Prescription Goal: Initial exercise prescription builds to 30-45 minutes a day of aerobic activity, 2-3 days per week.  Home exercise guidelines will be given to patient during program as part of exercise prescription that the participant will acknowledge.  Education: Aerobic Exercise: - Group verbal and visual presentation on the components of  exercise prescription. Introduces F.I.T.T principle from ACSM for exercise prescriptions.  Reviews F.I.T.T. principles of aerobic exercise including progression. Written material provided at class time.   Education: Resistance Exercise: - Group verbal and visual presentation on the components of exercise prescription. Introduces F.I.T.T principle from ACSM for exercise prescriptions  Reviews F.I.T.T. principles of resistance exercise including progression. Written material provided at class time.    Education: Exercise & Equipment Safety: - Individual verbal instruction and demonstration of  equipment use and safety with use of the equipment. Flowsheet Row Pulmonary Rehab from 01/07/2024 in [facility] Cardiac and Pulmonary Rehab  Date 01/07/24  Educator NT  Instruction Review Code 1- Verbalizes Understanding    Education: Exercise Physiology & General Exercise Guidelines: - Group verbal and written instruction with models to review the exercise physiology of the cardiovascular system and associated critical values. Provides general exercise guidelines with specific guidelines to those with heart or lung disease.    Education: Flexibility, Balance, Mind/Body Relaxation: - Group verbal and visual presentation with interactive activity on the components of exercise prescription. Introduces F.I.T.T principle from ACSM for exercise prescriptions. Reviews F.I.T.T. principles of flexibility and balance exercise training including progression. Also discusses the mind body connection.  Reviews various relaxation techniques to help reduce and manage stress (i.e. Deep breathing, progressive muscle relaxation, and visualization). Balance handout provided to take home. Written material provided at class time.   Activity Barriers & Risk Stratification:  Activity Barriers & Cardiac Risk Stratification - 12/25/23 1003       Activity Barriers & Cardiac Risk Stratification   Activity Barriers Left Knee  Replacement          6 Minute Walk:  6 Minute Walk     Row Name 01/07/24 1007         6 Minute Walk   Phase Initial     Distance 1145 feet     Walk Time 6 minutes     # of Rest Breaks 0     MPH 2.17     METS 2.92     RPE 11     Perceived Dyspnea  3     VO2 Peak 10.22     Symptoms Yes (comment)     Comments SOB     Resting HR 95 bpm     Resting BP 112/68     Resting Oxygen Saturation  93 %     Exercise Oxygen Saturation  during 6 min walk 83 %     Max Ex. HR 105 bpm     Max Ex. BP 124/66     2 Minute Post BP 118/66       Interval HR   1 Minute HR 98     2 Minute HR 101     3 Minute HR 102     4 Minute HR 101     5 Minute HR 105     6 Minute HR 104     2 Minute Post HR 88     Interval Heart Rate? Yes       Interval Oxygen   Interval Oxygen? Yes     Baseline Oxygen Saturation % 93 %     1 Minute Oxygen Saturation % 86 %     1 Minute Liters of Oxygen 0 L  RA     2 Minute Oxygen Saturation % 85 %     2 Minute Liters of Oxygen 0 L     3 Minute Oxygen Saturation % 83 %     3 Minute Liters of Oxygen 0 L     4 Minute Oxygen Saturation % 84 %     4 Minute Liters of Oxygen 0 L     5 Minute Oxygen Saturation % 85 %     5 Minute Liters of Oxygen 0 L     6 Minute Oxygen Saturation % 84 %     6 Minute Liters of Oxygen 0 L  2 Minute Post Oxygen Saturation % 92 %     2 Minute Post Liters of Oxygen 0 L       Oxygen Initial Assessment:  Oxygen Initial Assessment - 12/25/23 1006       Home Oxygen   Home Oxygen Device None    Sleep Oxygen Prescription None    Home Exercise Oxygen Prescription None    Home Resting Oxygen Prescription None    Compliance with Home Oxygen Use Yes      Intervention   Short Term Goals To learn and understand importance of monitoring SPO2 with pulse oximeter and demonstrate accurate use of the pulse oximeter.;To learn and understand importance of maintaining oxygen saturations>88%;To learn and demonstrate proper pursed lip breathing  techniques or other breathing techniques. ;To learn and demonstrate proper use of respiratory medications    Long  Term Goals Verbalizes importance of monitoring SPO2 with pulse oximeter and return demonstration;Maintenance of O2 saturations>88%;Exhibits proper breathing techniques, such as pursed lip breathing or other method taught during program session;Compliance with respiratory medication;Demonstrates proper use of MDI's          Oxygen Re-Evaluation:  Oxygen Re-Evaluation     Row Name 01/14/24 0917 02/04/24 0951           Program Oxygen Prescription   Program Oxygen Prescription -- None        Home Oxygen   Home Oxygen Device None None      Sleep Oxygen Prescription None None      Home Exercise Oxygen Prescription None None      Home Resting Oxygen Prescription None None      Compliance with Home Oxygen Use Yes Yes        Goals/Expected Outcomes   Short Term Goals To learn and understand importance of monitoring SPO2 with pulse oximeter and demonstrate accurate use of the pulse oximeter.;To learn and understand importance of maintaining oxygen saturations>88%;To learn and demonstrate proper pursed lip breathing techniques or other breathing techniques. ;To learn and demonstrate proper use of respiratory medications --      Long  Term Goals Verbalizes importance of monitoring SPO2 with pulse oximeter and return demonstration;Maintenance of O2 saturations>88%;Exhibits proper breathing techniques, such as pursed lip breathing or other method taught during program session;Compliance with respiratory medication;Demonstrates proper use of MDI's --      Comments Reviewed PLB technique with pt.  Talked about how it works and it's importance in maintaining their exercise saturations. Zachary Wallace is not using any supplemental O2 in the program or at home. He has access to a pulse ox at home to monitor his saturation. We reviewed PLB, and he continues to use techniques when he is in breathing  distress, as well as his medications.      Goals/Expected Outcomes Short: Become more profiecient at using PLB. Long: Become independent at using PLB. Short: Continue to check O2 sats when doing yardwork. Long: Continue to exercise, use PLB techniques, and take medication to manage breathing.         Oxygen Discharge (Final Oxygen Re-Evaluation):  Oxygen Re-Evaluation - 02/04/24 0951       Program Oxygen Prescription   Program Oxygen Prescription None      Home Oxygen   Home Oxygen Device None    Sleep Oxygen Prescription None    Home Exercise Oxygen Prescription None    Home Resting Oxygen Prescription None    Compliance with Home Oxygen Use Yes      Goals/Expected Outcomes  Comments Even is not using any supplemental O2 in the program or at home. He has access to a pulse ox at home to monitor his saturation. We reviewed PLB, and he continues to use techniques when he is in breathing distress, as well as his medications.    Goals/Expected Outcomes Short: Continue to check O2 sats when doing yardwork. Long: Continue to exercise, use PLB techniques, and take medication to manage breathing.          Initial Exercise Prescription:  Initial Exercise Prescription - 01/07/24 1000       Date of Initial Exercise RX and Referring Provider   Date 01/07/24    Referring Provider Dr. Darrin Barn, MD      Oxygen   Maintain Oxygen Saturation 88% or higher      Treadmill   MPH 2    Grade 0    Minutes 15    METs 2.53      Recumbant Bike   Level 2    RPM 50    Watts 24    Minutes 15    METs 2.92      NuStep   Level 2   T6 nustep   SPM 80    Minutes 15    METs 2.92      T5 Nustep   Level 1    SPM 80    Minutes 15    METs 2.92      Prescription Details   Frequency (times per week) 2    Duration Progress to 30 minutes of continuous aerobic without signs/symptoms of physical distress      Intensity   THRR 40-80% of Max Heartrate 118-142    Ratings of Perceived  Exertion 11-13    Perceived Dyspnea 0-4      Progression   Progression Continue to progress workloads to maintain intensity without signs/symptoms of physical distress.      Resistance Training   Training Prescription Yes    Weight 5 lb    Reps 10-15          Perform Capillary Blood Glucose checks as needed.  Exercise Prescription Changes:   Exercise Prescription Changes     Row Name 01/07/24 1000 01/21/24 1500 02/05/24 1600         Response to Exercise   Blood Pressure (Admit) 112/68 108/66 120/70     Blood Pressure (Exercise) 124/66 124/62 132/64     Blood Pressure (Exit) 118/66 112/62 104/62     Heart Rate (Admit) 95 bpm 84 bpm 80 bpm     Heart Rate (Exercise) 105 bpm 106 bpm 105 bpm     Heart Rate (Exit) 88 bpm 92 bpm 81 bpm     Oxygen Saturation (Admit) 93 % 91 % 91 %     Oxygen Saturation (Exercise) 83 % 88 % 88 %     Oxygen Saturation (Exit) 92 % 90 % 95 %     Rating of Perceived Exertion (Exercise) 11 15 14      Perceived Dyspnea (Exercise) 3 4 1      Symptoms SOB none none     Comments Results 1st week of exercise sessions --     Duration -- Progress to 30 minutes of  aerobic without signs/symptoms of physical distress Progress to 30 minutes of  aerobic without signs/symptoms of physical distress     Intensity -- THRR unchanged THRR unchanged       Progression   Progression -- Continue to progress workloads to  maintain intensity without signs/symptoms of physical distress. Continue to progress workloads to maintain intensity without signs/symptoms of physical distress.     Average METs -- 2.42 2.27       Resistance Training   Training Prescription -- Yes Yes     Weight -- 5 lb 5 lb     Reps -- 10-15 10-15       Interval Training   Interval Training -- No No       Treadmill   MPH -- 1.5 1.1     Grade -- 0 0     Minutes -- 15 15     METs -- 2.15 1.84       Recumbant Bike   Level -- 3 4     Watts -- 24 25     Minutes -- 15 15     METs -- 2.91  2.95       NuStep   Level -- 2  T6 nustep 3  T6 nustep     Minutes -- 15 15     METs -- -- 2       T5 Nustep   Level -- 2 3     Minutes -- 15 15     METs -- 2.2 2       Oxygen   Maintain Oxygen Saturation -- 88% or higher 88% or higher        Exercise Comments:   Exercise Comments     Row Name 01/14/24 [phone]           Exercise Comments First full day of exercise!  Patient was oriented to gym and equipment including functions, settings, policies, and procedures.  Patient's individual exercise prescription and treatment plan were reviewed.  All starting workloads were established based on the results of the 6 minute walk test done at initial orientation visit.  The plan for exercise progression was also introduced and progression will be customized based on patient's performance and goals.          Exercise Goals and Review:   Exercise Goals     Row Name 01/07/24 1010             Exercise Goals   Increase Physical Activity Yes       Intervention Provide advice, education, support and counseling about physical activity/exercise needs.;Develop an individualized exercise prescription for aerobic and resistive training based on initial evaluation findings, risk stratification, comorbidities and participant's personal goals.       Expected Outcomes Short Term: Attend rehab on a regular basis to increase amount of physical activity.;Long Term: Add in home exercise to make exercise part of routine and to increase amount of physical activity.;Long Term: Exercising regularly at least 3-5 days a week.       Increase Strength and Stamina Yes       Intervention Provide advice, education, support and counseling about physical activity/exercise needs.;Develop an individualized exercise prescription for aerobic and resistive training based on initial evaluation findings, risk stratification, comorbidities and participant's personal goals.       Expected Outcomes Short Term: Perform  resistance training exercises routinely during rehab and add in resistance training at home;Short Term: Increase workloads from initial exercise prescription for resistance, speed, and METs.;Long Term: Improve cardiorespiratory fitness, muscular endurance and strength as measured by increased METs and functional capacity ( )       Able to understand and use rate of perceived exertion (RPE) scale Yes       Intervention Provide  education and explanation on how to use RPE scale       Expected Outcomes Short Term: Able to use RPE daily in rehab to express subjective intensity level;Long Term:  Able to use RPE to guide intensity level when exercising independently       Able to understand and use Dyspnea scale Yes       Intervention Provide education and explanation on how to use Dyspnea scale       Expected Outcomes Long Term: Able to use Dyspnea scale to guide intensity level when exercising independently;Short Term: Able to use Dyspnea scale daily in rehab to express subjective sense of shortness of breath during exertion       Knowledge and understanding of Target Heart Rate Range (THRR) Yes       Intervention Provide education and explanation of THRR including how the numbers were predicted and where they are located for reference       Expected Outcomes Short Term: Able to state/look up THRR;Long Term: Able to use THRR to govern intensity when exercising independently;Short Term: Able to use daily as guideline for intensity in rehab       Able to check pulse independently Yes       Intervention Provide education and demonstration on how to check pulse in carotid and radial arteries.;Review the importance of being able to check your own pulse for safety during independent exercise       Expected Outcomes Short Term: Able to explain why pulse checking is important during independent exercise;Long Term: Able to check pulse independently and accurately       Understanding of Exercise Prescription Yes        Intervention Provide education, explanation, and written materials on patient's individual exercise prescription       Expected Outcomes Short Term: Able to explain program exercise prescription;Long Term: Able to explain home exercise prescription to exercise independently          Exercise Goals Re-Evaluation :  Exercise Goals Re-Evaluation     Row Name 01/14/24 0916 01/21/24 1510 02/05/24 1609         Exercise Goal Re-Evaluation   Exercise Goals Review Increase Physical Activity;Able to understand and use rate of perceived exertion (RPE) scale;Knowledge and understanding of Target Heart Rate Range (THRR);Understanding of Exercise Prescription;Increase Strength and Stamina;Able to understand and use Dyspnea scale;Able to check pulse independently Increase Physical Activity;Understanding of Exercise Prescription;Increase Strength and Stamina Increase Physical Activity;Understanding of Exercise Prescription;Increase Strength and Stamina     Comments Reviewed RPE and dyspnea scale, THR and program prescription with pt today.  Pt voiced understanding and was given a copy of goals to take home. Zachary Wallace is off to a good start in the program and he completed his first week in this review period. He had a workload on the treadmill of a speed of 1.5 mph and no incline. He also worked at level 2 on the T5 and T6 nusteps, and level 3 on the recumbent bike. We will continue to monitor his progress in the program. Zachary Wallace is doing well in rehab. He increased to level 4 on the recumbent bike. He also increased to level 3 on the T6 and T5 nustep. We will continue to monitor his progress in the program.     Expected Outcomes Short: Use RPE daily to regulate intensity. Long: Follow program prescription in THR. Short: Continue to follow current exercise prescription. Long: Continue exercise to improve strength and stamina. Short: Continue to progressively increase  workloads on the recumbent bike and nustep. Long:  Continue exercise to improve strength and stamina.        Discharge Exercise Prescription (Final Exercise Prescription Changes):  Exercise Prescription Changes - 02/05/24 1600       Response to Exercise   Blood Pressure (Admit) 120/70    Blood Pressure (Exercise) 132/64    Blood Pressure (Exit) 104/62    Heart Rate (Admit) 80 bpm    Heart Rate (Exercise) 105 bpm    Heart Rate (Exit) 81 bpm    Oxygen Saturation (Admit) 91 %    Oxygen Saturation (Exercise) 88 %    Oxygen Saturation (Exit) 95 %    Rating of Perceived Exertion (Exercise) 14    Perceived Dyspnea (Exercise) 1    Symptoms none    Duration Progress to 30 minutes of  aerobic without signs/symptoms of physical distress    Intensity THRR unchanged      Progression   Progression Continue to progress workloads to maintain intensity without signs/symptoms of physical distress.    Average METs 2.27      Resistance Training   Training Prescription Yes    Weight 5 lb    Reps 10-15      Interval Training   Interval Training No      Treadmill   MPH 1.1    Grade 0    Minutes 15    METs 1.84      Recumbant Bike   Level 4    Watts 25    Minutes 15    METs 2.95      NuStep   Level 3   T6 nustep   Minutes 15    METs 2      T5 Nustep   Level 3    Minutes 15    METs 2      Oxygen   Maintain Oxygen Saturation 88% or higher          Nutrition:  Target Goals: Understanding of nutrition guidelines, daily intake of sodium 1500mg , cholesterol 200mg , calories 30% from fat and 7% or less from saturated fats, daily to have 5 or more servings of fruits and vegetables.  Education: Nutrition 1 -Group instruction provided by verbal, written material, interactive activities, discussions, models, and posters to present general guidelines for heart healthy nutrition including macronutrients, label reading, and promoting whole foods over processed counterparts. Education serves as pensions consultant of discussion of heart healthy  eating for all. Written material provided at class time.     Education: Nutrition 2 -Group instruction provided by verbal, written material, interactive activities, discussions, models, and posters to present general guidelines for heart healthy nutrition including sodium, cholesterol, and saturated fat. Providing guidance of habit forming to improve blood pressure, cholesterol, and body weight. Written material provided at class time.     Biometrics:  Pre Biometrics - 01/07/24 1011       Pre Biometrics   Height 5' 9 (1.753 m)    Weight 181 lb 1.6 oz (82.1 kg)    Waist Circumference 38.5 inches    Hip Circumference 37.5 inches    Waist to Hip Ratio 1.03 %    BMI (Calculated) 26.73    Single Leg Stand 18.1 seconds           Nutrition Therapy Plan and Nutrition Goals:  Nutrition Therapy & Goals - 01/07/24 0955       Nutrition Therapy   RD appointment deferred Yes      Intervention Plan  Intervention Prescribe, educate and counsel regarding individualized specific dietary modifications aiming towards targeted core components such as weight, hypertension, lipid management, diabetes, heart failure and other comorbidities.    Expected Outcomes Short Term Goal: A plan has been developed with personal nutrition goals set during dietitian appointment.;Short Term Goal: Understand basic principles of dietary content, such as calories, fat, sodium, cholesterol and nutrients.;Long Term Goal: Adherence to prescribed nutrition plan.          Nutrition Assessments:  MEDIFICTS Score Key: >=70 Need to make dietary changes  40-70 Heart Healthy Diet <= 40 Therapeutic Level Cholesterol Diet  Flowsheet Row Pulmonary Rehab from 01/07/2024 in [facility] Cardiac and Pulmonary Rehab  Picture Your Plate Total Score on Admission 37   Picture Your Plate Scores: <59 Unhealthy dietary pattern with much room for improvement. 41-50 Dietary pattern unlikely to meet recommendations for good health  and room for improvement. 51-60 More healthful dietary pattern, with some room for improvement.  >60 Healthy dietary pattern, although there may be some specific behaviors that could be improved.   Nutrition Goals Re-Evaluation:  Nutrition Goals Re-Evaluation     Row Name 02/04/24 0947             Goals   Comment RD appt. deferred          Nutrition Goals Discharge (Final Nutrition Goals Re-Evaluation):  Nutrition Goals Re-Evaluation - 02/04/24 0947       Goals   Comment RD appt. deferred          Psychosocial: Target Goals: Acknowledge presence or absence of significant depression and/or stress, maximize coping skills, provide positive support system. Participant is able to verbalize types and ability to use techniques and skills needed for reducing stress and depression.   Education: Stress, Anxiety, and Depression - Group verbal and visual presentation to define topics covered.  Reviews how body is impacted by stress, anxiety, and depression.  Also discusses healthy ways to reduce stress and to treat/manage anxiety and depression.  Written material provided at class time.   Education: Sleep Hygiene -Provides group verbal and written instruction about how sleep can affect your health.  Define sleep hygiene, discuss sleep cycles and impact of sleep habits. Review good sleep hygiene tips.    Initial Review & Psychosocial Screening:  Initial Psych Review & Screening - 12/25/23 1014       Initial Review   Current issues with None Identified      Family Dynamics   Good Support System? Yes      Barriers   Psychosocial barriers to participate in program There are no identifiable barriers or psychosocial needs.      Screening Interventions   Interventions Encouraged to exercise;To provide support and resources with identified psychosocial needs;Provide feedback about the scores to participant    Expected Outcomes Short Term goal: Utilizing psychosocial counselor,  staff and physician to assist with identification of specific Stressors or current issues interfering with healing process. Setting desired goal for each stressor or current issue identified.;Long Term Goal: Stressors or current issues are controlled or eliminated.;Short Term goal: Identification and review with participant of any Quality of Life or Depression concerns found by scoring the questionnaire.;Long Term goal: The participant improves quality of Life and PHQ9 Scores as seen by post scores and/or verbalization of changes          Quality of Life Scores:  Scores of 19 and below usually indicate a poorer quality of life in these areas.  A difference of  2-3 points is a clinically meaningful difference.  A difference of 2-3 points in the total score of the Quality of Life Index has been associated with significant improvement in overall quality of life, self-image, physical symptoms, and general health in studies assessing change in quality of life.  PHQ-9: Review Flowsheet  More data exists      02/04/2024 01/07/2024 12/19/2023 05/28/2023 02/05/2023  Depression screen PHQ 2/9  Decreased Interest 1 2 0 0 0  Down, Depressed, Hopeless 0 0 0 0 0  PHQ - 2 Score 1 2 0 0 0  Altered sleeping 0 0 0 - 0  Tired, decreased energy 2 3 0 - 0  Change in appetite 2 3 0 - 0  Feeling bad or failure about yourself  0 0 0 - 0  Trouble concentrating 0 0 0 - 0  Moving slowly or fidgety/restless 0 0 0 - 0  Suicidal thoughts 0 0 0 - 0  PHQ-9 Score 5 8  0  - 0   Difficult doing work/chores Somewhat difficult Very difficult Not difficult at all - Not difficult at all    Details       Data saved with a previous flowsheet row definition        Interpretation of Total Score  Total Score Depression Severity:  1-4 = Minimal depression, 5-9 = Mild depression, 10-14 = Moderate depression, 15-19 = Moderately severe depression, 20-27 = Severe depression   Psychosocial Evaluation and Intervention:   Psychosocial Evaluation - 12/25/23 1014       Psychosocial Evaluation & Interventions   Interventions Encouraged to exercise with the program and follow exercise prescription    Comments Zachary Wallace is coming to pulmonary rehab with COPD. He states his shortness of breath has gotten worse over the last year and is ready to try this program to see if it will help. 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 He does not have any stress concerns at this time.    Expected Outcomes Short: attend pulmonary rehab for education and exercise Long: develop and maintain positive self care habits    Continue Psychosocial Services  Follow up required by staff          Psychosocial Re-Evaluation:  Psychosocial Re-Evaluation     Row Name 02/04/24 0944             Psychosocial Re-Evaluation   Current issues with None Identified       Comments Zachary Wallace is doing well and denies having any stress at this time. He has a good support system made up of his wife. He reports having no sleep concerns at this time, he states that he wakes up occasionally but it doesnt bother him.       Expected Outcomes Short: Continue to utilize support system, and exercise to relieve stress. Long: Continue to attend [facility] to manage stress.       Interventions Encouraged to attend Pulmonary Rehabilitation for the exercise       Continue Psychosocial Services  Follow up required by staff          Psychosocial Discharge (Final Psychosocial Re-Evaluation):  Psychosocial Re-Evaluation - 02/04/24 0944       Psychosocial Re-Evaluation   Current issues with None Identified    Comments Zachary Wallace is doing well and denies having any stress at this time. He has a good support system made up of his wife. He reports having no sleep concerns at  this time, he states that he wakes up occasionally but it doesnt bother him.    Expected Outcomes Short: Continue to utilize support system, and exercise to  relieve stress. Long: Continue to attend [facility] to manage stress.    Interventions Encouraged to attend Pulmonary Rehabilitation for the exercise    Continue Psychosocial Services  Follow up required by staff          Education: Education Goals: Education classes will be provided on a weekly basis, covering required topics. Participant will state understanding/return demonstration of topics presented.  Learning Barriers/Preferences:  Learning Barriers/Preferences - 12/25/23 1009       Learning Barriers/Preferences   Learning Barriers None    Learning Preferences None          General Pulmonary Education Topics:  Infection Prevention: - Provides verbal and written material to individual with discussion of infection control including proper hand washing and proper equipment cleaning during exercise session. Flowsheet Row Pulmonary Rehab from 01/07/2024 in [facility] Cardiac and Pulmonary Rehab  Date 01/07/24  Educator NT  Instruction Review Code 1- Verbalizes Understanding    Falls Prevention: - Provides verbal and written material to individual with discussion of falls prevention and safety. Flowsheet Row Pulmonary Rehab from 01/07/2024 in [facility] Cardiac and Pulmonary Rehab  Date 01/07/24  Educator NT  Instruction Review Code 1- Verbalizes Understanding    Chronic Lung Disease Review: - Group verbal instruction with posters, models, PowerPoint presentations and videos,  to review new updates, new respiratory medications, new advancements in procedures and treatments. Providing information on websites and 800 numbers for continued self-education. Includes information about supplement oxygen, available portable oxygen systems, continuous and intermittent flow rates, oxygen safety, concentrators, and Medicare reimbursement for oxygen. Explanation of Pulmonary Drugs, including class, frequency, complications, importance of spacers, rinsing mouth after steroid MDI's, and proper  cleaning methods for nebulizers. Review of basic lung anatomy and physiology related to function, structure, and complications of lung disease. Review of risk factors. Discussion about methods for diagnosing sleep apnea and types of masks and machines for OSA. Includes a review of the use of types of environmental controls: home humidity, furnaces, filters, dust mite/pet prevention, HEPA vacuums. Discussion about weather changes, air quality and the benefits of nasal washing. Instruction on Warning signs, infection symptoms, calling MD promptly, preventive modes, and value of vaccinations. Review of effective airway clearance, coughing and/or vibration techniques. Emphasizing that all should Create an Action Plan. Written material provided at class time. Flowsheet Row Pulmonary Rehab from 01/07/2024 in [facility] Cardiac and Pulmonary Rehab  Education need identified 01/07/24    AED/CPR: - Group verbal and written instruction with the use of models to demonstrate the basic use of the AED with the basic ABC's of resuscitation.    Tests and Procedures:  - Group verbal and visual presentation and models provide information about basic cardiac anatomy and function. Reviews the testing methods done to diagnose heart disease and the outcomes of the test results. Describes the treatment choices: Medical Management, Angioplasty, or Coronary Bypass Surgery for treating various heart conditions including Myocardial Infarction, Angina, Valve Disease, and Cardiac Arrhythmias.  Written material provided at class time.   Medication Safety: - Group verbal and visual instruction to review commonly prescribed medications for heart and lung disease. Reviews the medication, class of the drug, and side effects. Includes the steps to properly store meds and maintain the prescription regimen.  Written material given at graduation.   Other: -Provides group and verbal instruction on various  topics (see comments)   Knowledge  Questionnaire Score:  Knowledge Questionnaire Score - 01/07/24 0954       Knowledge Questionnaire Score   Pre Score 13/18           Core Components/Risk Factors/Patient Goals at Admission:  Personal Goals and Risk Factors at Admission - 12/25/23 1008       Core Components/Risk Factors/Patient Goals on Admission    Weight Management Weight Maintenance;Yes    Intervention Weight Management: Provide education and appropriate resources to help participant work on and attain dietary goals.;Weight Management: Develop a combined nutrition and exercise program designed to reach desired caloric intake, while maintaining appropriate intake of nutrient and fiber, sodium and fats, and appropriate energy expenditure required for the weight goal.    Expected Outcomes Long Term: Adherence to nutrition and physical activity/exercise program aimed toward attainment of established weight goal;Short Term: Continue to assess and modify interventions until short term weight is achieved;Weight Maintenance: Understanding of the daily nutrition guidelines, which includes 25-35% calories from fat, 7% or less cal from saturated fats, less than 200mg  cholesterol, less than 1.5gm of sodium, & 5 or more servings of fruits and vegetables daily;Understanding recommendations for meals to include 15-35% energy as protein, 25-35% energy from fat, 35-60% energy from carbohydrates, less than 200mg  of dietary cholesterol, 20-35 gm of total fiber daily;Understanding of distribution of calorie intake throughout the day with the consumption of 4-5 meals/snacks    Diabetes Yes    Intervention Provide education about signs/symptoms and action to take for hypo/hyperglycemia.;Provide education about proper nutrition, including hydration, and aerobic/resistive exercise prescription along with prescribed medications to achieve blood glucose in normal ranges: Fasting glucose 65-99 mg/dL    Expected Outcomes Short Term: Participant verbalizes  understanding of the signs/symptoms and immediate care of hyper/hypoglycemia, proper foot care and importance of medication, aerobic/resistive exercise and nutrition plan for blood glucose control.;Long Term: Attainment of HbA1C < 7%.    Hypertension Yes    Intervention Provide education on lifestyle modifcations including regular physical activity/exercise, weight management, moderate sodium restriction and increased consumption of fresh fruit, vegetables, and low fat dairy, alcohol moderation, and smoking cessation.;Monitor prescription use compliance.    Expected Outcomes Short Term: Continued assessment and intervention until BP is < 140/23mm HG in hypertensive participants. < 130/59mm HG in hypertensive participants with diabetes, heart failure or chronic kidney disease.;Long Term: Maintenance of blood pressure at goal levels.    Lipids Yes    Intervention Provide education and support for participant on nutrition & aerobic/resistive exercise along with prescribed medications to achieve LDL 70mg , HDL >40mg .    Expected Outcomes Short Term: Participant states understanding of desired cholesterol values and is compliant with medications prescribed. Participant is following exercise prescription and nutrition guidelines.;Long Term: Cholesterol controlled with medications as prescribed, with individualized exercise RX and with personalized nutrition plan. Value goals: LDL < 70mg , HDL > 40 mg.          Education:Diabetes - Individual verbal and written instruction to review signs/symptoms of diabetes, desired ranges of glucose level fasting, after meals and with exercise. Acknowledge that pre and post exercise glucose checks will be done for 3 sessions at entry of program. Flowsheet Row Pulmonary Rehab from 01/07/2024 in [facility] Cardiac and Pulmonary Rehab  Date 12/25/23  Educator [facility]  Instruction Review Code 1- Verbalizes Understanding    Know Your Numbers and Heart Failure: - Group verbal and  visual instruction to discuss disease risk factors for cardiac and pulmonary disease and  treatment options.  Reviews associated critical values for Overweight/Obesity, Hypertension, Cholesterol, and Diabetes.  Discusses basics of heart failure: signs/symptoms and treatments.  Introduces Heart Failure Zone chart for action plan for heart failure. Written material provided at class time.   Core Components/Risk Factors/Patient Goals Review:   Goals and Risk Factor Review     Row Name 02/04/24 0948             Core Components/Risk Factors/Patient Goals Review   Personal Goals Review Weight Management/Obesity;Diabetes;Hypertension       Review Tycen states that he would like to lose some weight but it is not a huge priority for him today, and he is comfortable with maintaining. He is taking both his BP and blood sugar every day. He states that his systolic BP stays around 110, and his blood sugar is in the mid 100's.       Expected Outcomes Short: Continue to take BP and sugar daily, maintain weight. Long: Continue to exercise and diet in order to manage hypertension, and weight goals.          Core Components/Risk Factors/Patient Goals at Discharge (Final Review):   Goals and Risk Factor Review - 02/04/24 0948       Core Components/Risk Factors/Patient Goals Review   Personal Goals Review Weight Management/Obesity;Diabetes;Hypertension    Review Zachary Wallace states that he would like to lose some weight but it is not a huge priority for him today, and he is comfortable with maintaining. He is taking both his BP and blood sugar every day. He states that his systolic BP stays around 110, and his blood sugar is in the mid 100's.    Expected Outcomes Short: Continue to take BP and sugar daily, maintain weight. Long: Continue to exercise and diet in order to manage hypertension, and weight goals.          ITP Comments:  ITP Comments     Row Name 12/25/23 1016 01/07/24 0929 01/14/24 0916 01/15/24  0944     ITP Comments Initial phone call completed. Diagnosis can be found in [facility] 9/15. EP Orientation scheduled for Tuesday 10/14 at 8am. Completed and gym orientation for pulmonary rehab. Initial ITP created and sent for review to Dr. Fuad Aleskerov, Medical Director. First full day of exercise!  Patient was oriented to gym and equipment including functions, settings, policies, and procedures.  Patient's individual exercise prescription and treatment plan were reviewed.  All starting workloads were established based on the results of the 6 minute walk test done at initial orientation visit.  The plan for exercise progression was also introduced and progression will be customized based on patient's performance and goals. 30 Day review completed. Medical Director ITP review done, changes made as directed, and signed approval by Medical Director.       Comments: 30 Day Review

## 2024-02-13 ENCOUNTER — Other Ambulatory Visit: Payer: Self-pay

## 2024-02-13 ENCOUNTER — Encounter: Admitting: Emergency Medicine

## 2024-02-13 DIAGNOSIS — J449 Chronic obstructive pulmonary disease, unspecified: Secondary | ICD-10-CM

## 2024-02-13 NOTE — Progress Notes (Signed)
 Specialty Pharmacy Refill Coordination Note  Zachary Wallace is a 68 y.o. male contacted today regarding refills of specialty medication(s) Dupilumab (Dupixent)   Patient requested Delivery   Delivery date: 02/28/24   Verified address: [address] [place], [place] [address]   Medication will be filled on: 02/27/24

## 2024-02-13 NOTE — Progress Notes (Signed)
 Daily Session Note  Patient Details  Name: AUGUSTO DECKMAN MRN: [id number] Date of Birth: 1956-01-23 Referring Provider:   Flowsheet Row Pulmonary Rehab from 01/07/2024 in [facility] Cardiac and Pulmonary Rehab  Referring Provider Dr. Darrin Barn, MD    Encounter Date: 02/13/2024  Check In:  Session Check In - 02/13/24 0929       Check-In   Supervising physician immediately available to respond to emergencies See telemetry face sheet for immediately available ER MD    Location [facility]-Cardiac & Pulmonary Rehab    Staff Present Rollene Paterson, MS, Exercise Physiologist;Maxon Conetta BS, Exercise Physiologist;Author Hatlestad RN,BSN;Jason Elnor RDN,LDN    Virtual Visit No    Medication changes reported     No    Fall or balance concerns reported    No    Tobacco Cessation No Change    Warm-up and Cool-down Performed on first and last piece of equipment    Resistance Training Performed Yes    VAD Patient? No    PAD/SET Patient? No      Pain Assessment   Currently in Pain? No/denies             Social History   Tobacco Use  Smoking Status Former   Current packs/day: 0.00   Average packs/day: 1 pack/day for 41.0 years (41.0 ttl pk-yrs)   Types: Cigarettes   Start date: 03/13/1981   Quit date: 03/13/2022   Years since quitting: 1.9   Passive exposure: Past  Smokeless Tobacco Never  Tobacco Comments   Smoked last on 12/11.  Trying to quit.   Using patches.  HFB  03/13/2022    Goals Met:  Proper associated with RPD/PD & O2 Sat Independence with exercise equipment Using PLB without cueing & demonstrates good technique Exercise tolerated well No report of concerns or symptoms today Strength training completed today  Goals Unmet:  Not Applicable  Comments: Pt able to follow exercise prescription today without complaint.  Will continue to monitor for progression.    Dr. Oneil Pinal is Medical Director for [facility] Cardiac Rehabilitation.  Dr. Fuad Aleskerov is Medical  Director for [facility] Pulmonary Rehabilitation.

## 2024-02-18 ENCOUNTER — Encounter

## 2024-02-18 DIAGNOSIS — J449 Chronic obstructive pulmonary disease, unspecified: Secondary | ICD-10-CM

## 2024-02-18 NOTE — Progress Notes (Signed)
 Daily Session Note  Patient Details  Name: Zachary Wallace MRN: [id number] Date of Birth: 1955/10/18 Referring Provider:   Flowsheet Row Pulmonary Rehab from 01/07/2024 in [facility] Cardiac and Pulmonary Rehab  Referring Provider Dr. Darrin Barn, MD    Encounter Date: 02/18/2024  Check In:  Session Check In - 02/18/24 0952       Check-In   Supervising physician immediately available to respond to emergencies See telemetry face sheet for immediately available ER MD    Location [facility]-Cardiac & Pulmonary Rehab    Staff Present Burnard Davenport RN,BSN,MPA;Maxon Conetta BS, Exercise Physiologist;Margaret Best, MS, Exercise Physiologist;Jason Elnor RDN,LDN    Virtual Visit No    Medication changes reported     No    Fall or balance concerns reported    No    Tobacco Cessation No Change    Warm-up and Cool-down Performed on first and last piece of equipment    Resistance Training Performed Yes    VAD Patient? No    PAD/SET Patient? No      Pain Assessment   Currently in Pain? No/denies             Social History   Tobacco Use  Smoking Status Former   Current packs/day: 0.00   Average packs/day: 1 pack/day for 41.0 years (41.0 ttl pk-yrs)   Types: Cigarettes   Start date: 03/13/1981   Quit date: 03/13/2022   Years since quitting: 1.9   Passive exposure: Past  Smokeless Tobacco Never  Tobacco Comments   Smoked last on 12/11.  Trying to quit.   Using patches.  HFB  03/13/2022    Goals Met:  Proper associated with RPD/PD & O2 Sat Independence with exercise equipment Using PLB without cueing & demonstrates good technique Exercise tolerated well No report of concerns or symptoms today Strength training completed today  Goals Unmet:  Not Applicable  Comments: Pt able to follow exercise prescription today without complaint.  Will continue to monitor for progression.    Dr. Oneil Pinal is Medical Director for [facility] Cardiac Rehabilitation.  Dr. Fuad Aleskerov is  Medical Director for [facility] Pulmonary Rehabilitation.

## 2024-02-25 ENCOUNTER — Encounter: Attending: Pulmonary Disease

## 2024-02-25 DIAGNOSIS — J449 Chronic obstructive pulmonary disease, unspecified: Secondary | ICD-10-CM | POA: Diagnosis present

## 2024-02-25 NOTE — Progress Notes (Signed)
 Daily Session Note  Patient Details  Name: Zachary Wallace MRN: [id number] Date of Birth: 07/29/1955 Referring Provider:   Flowsheet Row Pulmonary Rehab from 01/07/2024 in [facility] Cardiac and Pulmonary Rehab  Referring Provider Dr. Darrin Barn, MD    Encounter Date: 02/25/2024  Check In:  Session Check In - 02/25/24 0932       Check-In   Supervising physician immediately available to respond to emergencies See telemetry face sheet for immediately available ER MD    Location [facility]-Cardiac & Pulmonary Rehab    Staff Present Burnard Davenport RN,BSN,MPA;Maxon Conetta BS, Exercise Physiologist;Margaret Best, MS, Exercise Physiologist;Jason Elnor RDN,LDN;Noah Tickle, BS, Exercise Physiologist    Virtual Visit No    Medication changes reported     No    Fall or balance concerns reported    No    Tobacco Cessation No Change    Warm-up and Cool-down Performed on first and last piece of equipment    Resistance Training Performed Yes    VAD Patient? No    PAD/SET Patient? No      Pain Assessment   Currently in Pain? No/denies             Social History   Tobacco Use  Smoking Status Former   Current packs/day: 0.00   Average packs/day: 1 pack/day for 41.0 years (41.0 ttl pk-yrs)   Types: Cigarettes   Start date: 03/13/1981   Quit date: 03/13/2022   Years since quitting: 1.9   Passive exposure: Past  Smokeless Tobacco Never  Tobacco Comments   Smoked last on 12/11.  Trying to quit.   Using patches.  HFB  03/13/2022    Goals Met:  Proper associated with RPD/PD & O2 Sat Independence with exercise equipment Using PLB without cueing & demonstrates good technique Exercise tolerated well No report of concerns or symptoms today Strength training completed today  Goals Unmet:  Not Applicable  Comments: Pt able to follow exercise prescription today without complaint.  Will continue to monitor for progression.    Dr. Oneil Pinal is Medical Director for [facility] Cardiac  Rehabilitation.  Dr. Fuad Aleskerov is Medical Director for [facility] Pulmonary Rehabilitation.

## 2024-02-27 ENCOUNTER — Encounter

## 2024-02-27 ENCOUNTER — Other Ambulatory Visit: Payer: Self-pay

## 2024-02-27 DIAGNOSIS — E119 Type 2 diabetes mellitus without complications: Secondary | ICD-10-CM | POA: Diagnosis not present

## 2024-02-27 DIAGNOSIS — H2513 Age-related nuclear cataract, bilateral: Secondary | ICD-10-CM | POA: Diagnosis not present

## 2024-02-27 LAB — OPHTHALMOLOGY REPORT-SCANNED

## 2024-02-28 ENCOUNTER — Ambulatory Visit: Admitting: Internal Medicine

## 2024-03-03 ENCOUNTER — Encounter

## 2024-03-05 ENCOUNTER — Encounter: Admitting: Emergency Medicine

## 2024-03-05 DIAGNOSIS — J449 Chronic obstructive pulmonary disease, unspecified: Secondary | ICD-10-CM

## 2024-03-05 NOTE — Progress Notes (Signed)
 Daily Session Note  Patient Details  Name: Zachary Wallace MRN: [id number] Date of Birth: 1955-09-06 Referring Provider:   Flowsheet Row Pulmonary Rehab from 01/07/2024 in [facility] Cardiac and Pulmonary Rehab  Referring Provider Dr. Darrin Barn, MD    Encounter Date: 03/05/2024  Check In:  Session Check In - 03/05/24 0949       Check-In   Supervising physician immediately available to respond to emergencies See telemetry face sheet for immediately available ER MD    Location [facility]-Cardiac & Pulmonary Rehab    Staff Present Leita Franks RN,BSN;[facility] BS, Exercise Physiologist;Kristen Coble RN,BC,MSN    Virtual Visit No    Medication changes reported     No    Fall or balance concerns reported    No    Tobacco Cessation No Change    Warm-up and Cool-down Performed on first and last piece of equipment    Resistance Training Performed Yes    VAD Patient? No    PAD/SET Patient? No      Pain Assessment   Currently in Pain? No/denies             Tobacco Use History[1]  Goals Met:  Proper associated with RPD/PD & O2 Sat Independence with exercise equipment Using PLB without cueing & demonstrates good technique Exercise tolerated well No report of concerns or symptoms today Strength training completed today  Goals Unmet:  Not Applicable  Comments: Pt able to follow exercise prescription today without complaint.  Will continue to monitor for progression.    Dr. Oneil Pinal is Medical Director for [facility] Cardiac Rehabilitation.  Dr. Fuad Aleskerov is Medical Director for [facility] Pulmonary Rehabilitation.    [1]  Social History Tobacco Use  Smoking Status Former   Current packs/day: 0.00   Average packs/day: 1 pack/day for 41.0 years (41.0 ttl pk-yrs)   Types: Cigarettes   Start date: 03/13/1981   Quit date: 03/13/2022   Years since quitting: 1.9   Passive exposure: Past  Smokeless Tobacco Never  Tobacco Comments    Smoked last on 12/11.  Trying to quit.   Using patches.  HFB  03/13/2022

## 2024-03-09 ENCOUNTER — Encounter: Payer: Self-pay | Admitting: Pulmonary Disease

## 2024-03-09 ENCOUNTER — Ambulatory Visit: Admitting: Pulmonary Disease

## 2024-03-09 VITALS — BP 126/80 | HR 77 | Temp 98.5°F | Ht 69.0 in | Wt 179.8 lb

## 2024-03-09 DIAGNOSIS — J4489 Other specified chronic obstructive pulmonary disease: Secondary | ICD-10-CM | POA: Diagnosis not present

## 2024-03-09 MED ORDER — BREZTRI AEROSPHERE 160-9-4.8 MCG/ACT IN AERO: 2.0000 | INHALATION_SPRAY | Freq: Two times a day (BID) | RESPIRATORY_TRACT | Status: DC

## 2024-03-09 NOTE — Progress Notes (Signed)
 Synopsis: Referred in  by Bernardo Fend, DO   Subjective:   PATIENT ID: Zachary Wallace: male DOB: 02-15-1956, MRN: [id number]  Chief Complaint  Patient presents with   COPD    DOE. No wheezing or cough. Doing Pulm Rehab and feels like it helps. Breztri - BID, helps with his breathing. Albuterol - BID. Albuterol  Neb- PRN    HPI Zachary Wallace is a pleasant 68 year old male patient with a past medical history of COPD stage IV gp B on triple inhaler therapy previously following with Dr. Darlean presenting today to the pulmonary clinic for a follow up visit.   Last seen Dr. Darlean in March 2024 presenting today for a follow up visit. Reports shortness of breath mostly on exertion associated with cough sometimes dry and sometimes productive. Unable to go uphill without feeling shortness of breath. CAT in clinic 20.   He is enrolled in the low dose CT scan program with recent CT chest lung rads 2. pending repeat CT in January 2025. It showed emphysema.   FH - Denies any family history of pulmonary diseases.   Social History - Quit smoking last year. Smoked 1/2 ppd for years.    OV 12/09/2023 Zachary Wallace is here to follow-up on his COPD.  He has stage IV COPD with high mMRC.  For the last 4 weeks he has been having shortness of breath on exertion and with minimal daily activities.  Discussed with him that this is likely due to his underlying advanced COPD.  I doubt that this is an acute exacerbation or pneumonia.  I have discussed with him the importance of pulmonary rehab which he is agreeable.  His peripheral eosinophil count was 300 and therefore he would qualify for Dupixent .  I will put in a referral to start Dupixent .  OV 03/09/2024 - Zachary Wallace is here to follow-up on his COPD stage IV.  He feels generally well with Breztri  and Dupixent  that was started on 11/2018.  Using albuterol  daily though less frequently as before.  He is ongoing pulmonary rehab and feels like it is helping him with  daily activities.  He scored 14 on his CAT.  Did not have any exacerbations since I last saw him.  I will see him in 6 months and assess need for Ensifentrine .   ROS All systems were reviewed and are negative except for the above.  Objective:   Vitals:   03/09/24 0848  BP: 126/80  Pulse: 77  Temp: 98.5 F (36.9 C)  SpO2: 96%  Weight: 179 lb 12.8 oz (81.6 kg)  Height: 5' 9 (1.753 m)   96% on RA BMI Readings from Last 3 Encounters:  03/09/24 26.55 kg/m  01/07/24 26.74 kg/m  12/09/23 29.09 kg/m   Wt Readings from Last 3 Encounters:  03/09/24 179 lb 12.8 oz (81.6 kg)  01/07/24 181 lb 1.6 oz (82.1 kg)  12/09/23 180 lb 3.2 oz (81.7 kg)    Physical Exam GEN: NAD HEENT: Supple Neck, Reactive Pupils, EOMI  CVS: Normal S1, Normal S2, RRR, No murmurs or ES appreciated  Lungs: Poor air movement bilaterally.  Abdomen: Soft, non tender, non distended, + BS  Extremities: Warm and well perfused, No edema   Labs and imaging were reviewed.   Ancillary Information   CBC    Component Value Date/Time   WBC 6.0 02/23/2021 0920   RBC 5.23 02/23/2021 0920   HGB 15.5 02/23/2021 0920   HCT 46.9 02/23/2021 0920   PLT  193 02/23/2021 0920   MCV 89.7 02/23/2021 0920   MCH 29.6 02/23/2021 0920   MCHC 33.0 02/23/2021 0920   RDW 12.3 02/23/2021 0920   LYMPHSABS 1,554 02/23/2021 0920   EOSABS 300 02/23/2021 0920   BASOSABS 30 02/23/2021 0920    Imaging  LDCT 03/2022  Central airways are patent. Mild centrilobular emphysema. Mild bilateral bronchial wall thickening, unchanged when compared with the prior exam. No consolidation, pleural effusion or pneumothorax. Previously seen small solid pulmonary nodules have resolved. New tiny solid nodule of the left lower lobe measuring 2.2 mm on image 231.  LDCT 03/2023 - Lung-RADS 1 negative.      Latest Ref Rng & Units 08/17/2021    9:55 AM  PFT Results  FVC-Pre L 2.35   FVC-Predicted Pre % 61   FVC-Post L 2.33   FVC-Predicted Post  % 61   Pre FEV1/FVC % % 38   Post FEV1/FCV % % 41   FEV1-Pre L 0.88   FEV1-Predicted Pre % 30   FEV1-Post L 0.95   DLCO uncorrected ml/min/mmHg 11.19   DLCO UNC% % 42   DLVA Predicted % 50   TLC L 5.75   TLC % Predicted % 84   RV % Predicted % 113      Assessment & Plan:  Zachary Wallace is a pleasant 68 year old male patient with a past medical history of COPD stage IV gp B on triple inhaler therapy previously following with Dr. Darlean presenting today to the pulmonary clinic for a follow up visit.   #COPD stage III-IV gp B CAT 20 - CAT 14 02/2024  Peripheral eosinophil count 300 []  c/w Budesonide-Formoterol -Glycopyrrolate  [Breztri ] 2 puffs BID.  []  c/w Albuterol  2puffs Q6H as needed  []  C/w Dupixent  (11/2023) []  C/w Pulmonary Rehab placed.   #Tobacco use  Remains Abstinent from smoking.  Enrolled in LDCT next CT chest on 03/2024.    RTC 6 months  I personally spent a total of 30 minutes in the care of the patient today including preparing to see the patient, getting/reviewing separately obtained history, performing a medically appropriate exam/evaluation, counseling and educating, documenting clinical information in the EHR, independently interpreting results, and communicating results.   Darrin Barn, MD [facility] 03/09/2024 9:05 AM

## 2024-03-10 ENCOUNTER — Encounter

## 2024-03-10 DIAGNOSIS — J449 Chronic obstructive pulmonary disease, unspecified: Secondary | ICD-10-CM | POA: Diagnosis not present

## 2024-03-10 NOTE — Progress Notes (Signed)
 Daily Session Note  Patient Details  Name: Zachary Wallace MRN: [id number] Date of Birth: Jul 11, 1955 Referring Provider:   Flowsheet Row Pulmonary Rehab from 01/07/2024 in [facility] Cardiac and Pulmonary Rehab  Referring Provider Dr. Darrin Barn, MD    Encounter Date: 03/10/2024  Check In:  Session Check In - 03/10/24 0943       Check-In   Supervising physician immediately available to respond to emergencies See telemetry face sheet for immediately available ER MD    Location [facility]-Cardiac & Pulmonary Rehab    Staff Present Burnard Davenport RN,BSN,MPA;Maxon Conetta BS, Exercise Physiologist;Margaret Best, MS, Exercise Physiologist;Jason Elnor RDN,LDN    Virtual Visit No    Medication changes reported     No    Fall or balance concerns reported    No    Tobacco Cessation No Change    Warm-up and Cool-down Performed on first and last piece of equipment    Resistance Training Performed Yes    VAD Patient? No    PAD/SET Patient? No      Pain Assessment   Currently in Pain? No/denies             Tobacco Use History[1]  Goals Met:  Proper associated with RPD/PD & O2 Sat Independence with exercise equipment Using PLB without cueing & demonstrates good technique Exercise tolerated well No report of concerns or symptoms today Strength training completed today  Goals Unmet:  Not Applicable  Comments: Pt able to follow exercise prescription today without complaint.  Will continue to monitor for progression.    Dr. Oneil Pinal is Medical Director for [facility] Cardiac Rehabilitation.  Dr. Fuad Aleskerov is Medical Director for [facility] Pulmonary Rehabilitation.    [1]  Social History Tobacco Use  Smoking Status Former   Current packs/day: 0.00   Average packs/day: 1 pack/day for 41.0 years (41.0 ttl pk-yrs)   Types: Cigarettes   Start date: 03/13/1981   Quit date: 03/13/2022   Years since quitting: 1.9   Passive exposure: Past  Smokeless Tobacco Never   Tobacco Comments   Smoked last on 12/11.  Trying to quit.   Using patches.  HFB  03/13/2022

## 2024-03-11 ENCOUNTER — Encounter: Payer: Self-pay | Admitting: *Deleted

## 2024-03-11 DIAGNOSIS — J449 Chronic obstructive pulmonary disease, unspecified: Secondary | ICD-10-CM

## 2024-03-11 NOTE — Progress Notes (Signed)
 Pulmonary Individual Treatment Plan  Patient Details  Name: Zachary Wallace MRN: [id number] Date of Birth: [birth year] Referring Provider:   Flowsheet Row Pulmonary Rehab from 01/07/2024 in [facility] Cardiac and Pulmonary Rehab  Referring Provider Dr. Darrin Barn, MD    Initial Encounter Date:  Flowsheet Row Pulmonary Rehab from 01/07/2024 in [facility] Cardiac and Pulmonary Rehab  Date 01/07/24    Visit Diagnosis: Chronic obstructive pulmonary disease, unspecified COPD type (HCC)  Patient's Home Medications on Admission: Current Medications[1]  Past Medical History: Past Medical History:  Diagnosis Date   COPD (chronic obstructive pulmonary disease) (HCC)    Diabetes mellitus without complication (HCC)    Hyperlipidemia    Hypertension    OSA (obstructive sleep apnea)     Tobacco Use: Tobacco Use History[2]  Labs: Review Flowsheet  More data exists      Latest Ref Rng & Units 06/28/2022 07/02/2022 11/23/2022 05/24/2023 11/28/2023  Labs for ITP Cardiac and Pulmonary Rehab  Cholestrol 0 - 200 mg/dL - 893  - - -  LDL (calc) 0 - 99 mg/dL - 43  - - -  HDL-C >59 mg/dL - 41  - - -  Trlycerides <150 mg/dL - 888  - - -  Hemoglobin A1c 4.0 - 5.6 % 9.4  - 6.9     7.6     7.4     Details       This result is from an external source.          Pulmonary Assessment Scores:  Pulmonary Assessment Scores     Row Name 01/07/24 0941         ADL UCSD   ADL Phase Entry     SOB Score total 86     Rest 3     Walk 3     Stairs 3     Bath 5     Dress 4     Shop 2       CAT Score   CAT Score 21       mMRC Score   mMRC Score 2        UCSD: Self-administered rating of dyspnea associated with activities of daily living (ADLs) 6-point scale (0 = not at all to 5 = maximal or unable to do because of breathlessness)  Scoring Scores range from 0 to 120.  Minimally important difference is 5 units  CAT: CAT can identify the health impairment of COPD patients and is better  correlated with disease progression.  CAT has a scoring range of zero to 40. The CAT score is classified into four groups of low (less than 10), medium (10 - 20), high (21-30) and very high (31-40) based on the impact level of disease on health status. A CAT score over 10 suggests significant symptoms.  A worsening CAT score could be explained by an exacerbation, poor medication adherence, poor inhaler technique, or progression of COPD or comorbid conditions.  CAT MCID is 2 points  mMRC: mMRC (Modified Medical Research Council) Dyspnea Scale is used to assess the degree of baseline functional disability in patients of respiratory disease due to dyspnea. No minimal important difference is established. A decrease in score of 1 point or greater is considered a positive change.   Pulmonary Function Assessment:   Exercise Target Goals: Exercise Program Goal: Individual exercise prescription set using results from initial 6 min walk test and THRR while considering  patients activity barriers and safety.   Exercise Prescription Goal: Initial exercise prescription  builds to 30-45 minutes a day of aerobic activity, 2-3 days per week.  Home exercise guidelines will be given to patient during program as part of exercise prescription that the participant will acknowledge.  Education: Aerobic Exercise: - Group verbal and visual presentation on the components of exercise prescription. Introduces F.I.T.T principle from ACSM for exercise prescriptions.  Reviews F.I.T.T. principles of aerobic exercise including progression. Written material provided at class time.   Education: Resistance Exercise: - Group verbal and visual presentation on the components of exercise prescription. Introduces F.I.T.T principle from ACSM for exercise prescriptions  Reviews F.I.T.T. principles of resistance exercise including progression. Written material provided at class time.    Education: Exercise & Equipment Safety: -  Individual verbal instruction and demonstration of equipment use and safety with use of the equipment. Flowsheet Row Pulmonary Rehab from 01/07/2024 in [facility] Cardiac and Pulmonary Rehab  Date 01/07/24  Educator NT  Instruction Review Code 1- Verbalizes Understanding    Education: Exercise Physiology & General Exercise Guidelines: - Group verbal and written instruction with models to review the exercise physiology of the cardiovascular system and associated critical values. Provides general exercise guidelines with specific guidelines to those with heart or lung disease.    Education: Flexibility, Balance, Mind/Body Relaxation: - Group verbal and visual presentation with interactive activity on the components of exercise prescription. Introduces F.I.T.T principle from ACSM for exercise prescriptions. Reviews F.I.T.T. principles of flexibility and balance exercise training including progression. Also discusses the mind body connection.  Reviews various relaxation techniques to help reduce and manage stress (i.e. Deep breathing, progressive muscle relaxation, and visualization). Balance handout provided to take home. Written material provided at class time.   Activity Barriers & Risk Stratification:  Activity Barriers & Cardiac Risk Stratification - 12/25/23 1003       Activity Barriers & Cardiac Risk Stratification   Activity Barriers Left Knee Replacement          6 Minute Walk:  6 Minute Walk     Row Name 01/07/24 1007         6 Minute Walk   Phase Initial     Distance 1145 feet     Walk Time 6 minutes     # of Rest Breaks 0     MPH 2.17     METS 2.92     RPE 11     Perceived Dyspnea  3     VO2 Peak 10.22     Symptoms Yes (comment)     Comments SOB     Resting HR 95 bpm     Resting BP 112/68     Resting Oxygen Saturation  93 %     Exercise Oxygen Saturation  during 6 min walk 83 %     Max Ex. HR 105 bpm     Max Ex. BP 124/66     2 Minute Post BP 118/66        Interval HR   1 Minute HR 98     2 Minute HR 101     3 Minute HR 102     4 Minute HR 101     5 Minute HR 105     6 Minute HR 104     2 Minute Post HR 88     Interval Heart Rate? Yes       Interval Oxygen   Interval Oxygen? Yes     Baseline Oxygen Saturation % 93 %     1 Minute Oxygen Saturation %  86 %     1 Minute Liters of Oxygen 0 L  RA     2 Minute Oxygen Saturation % 85 %     2 Minute Liters of Oxygen 0 L     3 Minute Oxygen Saturation % 83 %     3 Minute Liters of Oxygen 0 L     4 Minute Oxygen Saturation % 84 %     4 Minute Liters of Oxygen 0 L     5 Minute Oxygen Saturation % 85 %     5 Minute Liters of Oxygen 0 L     6 Minute Oxygen Saturation % 84 %     6 Minute Liters of Oxygen 0 L     2 Minute Post Oxygen Saturation % 92 %     2 Minute Post Liters of Oxygen 0 L       Oxygen Initial Assessment:  Oxygen Initial Assessment - 12/25/23 1006       Home Oxygen   Home Oxygen Device None    Sleep Oxygen Prescription None    Home Exercise Oxygen Prescription None    Home Resting Oxygen Prescription None    Compliance with Home Oxygen Use Yes      Intervention   Short Term Goals To learn and understand importance of monitoring SPO2 with pulse oximeter and demonstrate accurate use of the pulse oximeter.;To learn and understand importance of maintaining oxygen saturations>88%;To learn and demonstrate proper pursed lip breathing techniques or other breathing techniques. ;To learn and demonstrate proper use of respiratory medications    Long  Term Goals Verbalizes importance of monitoring SPO2 with pulse oximeter and return demonstration;Maintenance of O2 saturations>88%;Exhibits proper breathing techniques, such as pursed lip breathing or other method taught during program session;Compliance with respiratory medication;Demonstrates proper use of MDIs          Oxygen Re-Evaluation:  Oxygen Re-Evaluation     Row Name 01/14/24 0917 02/04/24 0951 03/10/24 0937          Program Oxygen Prescription   Program Oxygen Prescription -- None None       Home Oxygen   Home Oxygen Device None None None     Sleep Oxygen Prescription None None None     Home Exercise Oxygen Prescription None None --     Home Resting Oxygen Prescription None None None     Compliance with Home Oxygen Use Yes Yes Yes       Goals/Expected Outcomes   Short Term Goals To learn and understand importance of monitoring SPO2 with pulse oximeter and demonstrate accurate use of the pulse oximeter.;To learn and understand importance of maintaining oxygen saturations>88%;To learn and demonstrate proper pursed lip breathing techniques or other breathing techniques. ;To learn and demonstrate proper use of respiratory medications -- To learn and understand importance of monitoring SPO2 with pulse oximeter and demonstrate accurate use of the pulse oximeter.;To learn and understand importance of maintaining oxygen saturations>88%;To learn and demonstrate proper pursed lip breathing techniques or other breathing techniques. ;To learn and demonstrate proper use of respiratory medications     Long  Term Goals Verbalizes importance of monitoring SPO2 with pulse oximeter and return demonstration;Maintenance of O2 saturations>88%;Exhibits proper breathing techniques, such as pursed lip breathing or other method taught during program session;Compliance with respiratory medication;Demonstrates proper use of MDIs -- Verbalizes importance of monitoring SPO2 with pulse oximeter and return demonstration;Maintenance of O2 saturations>88%;Exhibits proper breathing techniques, such as pursed lip breathing or other method taught  during program session;Compliance with respiratory medication;Demonstrates proper use of MDIs     Comments Reviewed PLB technique with pt.  Talked about how it works and it's importance in maintaining their exercise saturations. Orion is not using any supplemental O2 in the program or at home. He has  access to a pulse ox at home to monitor his saturation. We reviewed PLB, and he continues to use techniques when he is in breathing distress, as well as his medications. Brendon is not using any supplemental O2 in the program or at home. He has access to a pulse ox at home to monitor his saturation. We reviewed PLB, and he continues to use techniques when he is in breathing distress, as well as his medications.     Goals/Expected Outcomes Short: Become more profiecient at using PLB. Long: Become independent at using PLB. Short: Continue to check O2 sats when doing yardwork. Long: Continue to exercise, use PLB techniques, and take medication to manage breathing. Short: Continue to check O2 sats when doing yardwork. Long: Continue to exercise, use PLB techniques, and take medication to manage breathing.        Oxygen Discharge (Final Oxygen Re-Evaluation):  Oxygen Re-Evaluation - 03/10/24 0937       Program Oxygen Prescription   Program Oxygen Prescription None      Home Oxygen   Home Oxygen Device None    Sleep Oxygen Prescription None    Home Resting Oxygen Prescription None    Compliance with Home Oxygen Use Yes      Goals/Expected Outcomes   Short Term Goals To learn and understand importance of monitoring SPO2 with pulse oximeter and demonstrate accurate use of the pulse oximeter.;To learn and understand importance of maintaining oxygen saturations>88%;To learn and demonstrate proper pursed lip breathing techniques or other breathing techniques. ;To learn and demonstrate proper use of respiratory medications    Long  Term Goals Verbalizes importance of monitoring SPO2 with pulse oximeter and return demonstration;Maintenance of O2 saturations>88%;Exhibits proper breathing techniques, such as pursed lip breathing or other method taught during program session;Compliance with respiratory medication;Demonstrates proper use of MDIs    Comments Zohan is not using any supplemental O2 in the program or  at home. He has access to a pulse ox at home to monitor his saturation. We reviewed PLB, and he continues to use techniques when he is in breathing distress, as well as his medications.    Goals/Expected Outcomes Short: Continue to check O2 sats when doing yardwork. Long: Continue to exercise, use PLB techniques, and take medication to manage breathing.          Initial Exercise Prescription:  Initial Exercise Prescription - 01/07/24 1000       Date of Initial Exercise RX and Referring Provider   Date 01/07/24    Referring Provider Dr. Darrin Barn, MD      Oxygen   Maintain Oxygen Saturation 88% or higher      Treadmill   MPH 2    Grade 0    Minutes 15    METs 2.53      Recumbant Bike   Level 2    RPM 50    Watts 24    Minutes 15    METs 2.92      NuStep   Level 2   T6 nustep   SPM 80    Minutes 15    METs 2.92      T5 Nustep   Level 1    SPM  80    Minutes 15    METs 2.92      Prescription Details   Frequency (times per week) 2    Duration Progress to 30 minutes of continuous aerobic without signs/symptoms of physical distress      Intensity   THRR 40-80% of Max Heartrate 118-142    Ratings of Perceived Exertion 11-13    Perceived Dyspnea 0-4      Progression   Progression Continue to progress workloads to maintain intensity without signs/symptoms of physical distress.      Resistance Training   Training Prescription Yes    Weight 5 lb    Reps 10-15          Perform Capillary Blood Glucose checks as needed.  Exercise Prescription Changes:   Exercise Prescription Changes     Row Name 01/07/24 1000 01/21/24 1500 02/05/24 1600 02/18/24 1400 03/05/24 1100     Response to Exercise   Blood Pressure (Admit) 112/68 108/66 120/70 126/64 116/70   Blood Pressure (Exercise) 124/66 124/62 132/64 128/64 --   Blood Pressure (Exit) 118/66 112/62 104/62 116/60 122/66   Heart Rate (Admit) 95 bpm 84 bpm 80 bpm 74 bpm 89 bpm   Heart Rate (Exercise) 105  bpm 106 bpm 105 bpm 94 bpm 96 bpm   Heart Rate (Exit) 88 bpm 92 bpm 81 bpm 87 bpm 86 bpm   Oxygen Saturation (Admit) 93 % 91 % 91 % 93 % 90 %   Oxygen Saturation (Exercise) 83 % 88 % 88 % 88 % 87 %  went up to 88% with rest   Oxygen Saturation (Exit) 92 % 90 % 95 % 90 % 90 %   Rating of Perceived Exertion (Exercise) 11 15 14 11 11    Perceived Dyspnea (Exercise) 3 4 1 2 3    Symptoms SOB none none none none   Comments Results 1st week of exercise sessions -- -- --   Duration -- Progress to 30 minutes of  aerobic without signs/symptoms of physical distress Progress to 30 minutes of  aerobic without signs/symptoms of physical distress Progress to 30 minutes of  aerobic without signs/symptoms of physical distress Progress to 30 minutes of  aerobic without signs/symptoms of physical distress   Intensity -- THRR unchanged THRR unchanged THRR unchanged THRR unchanged     Progression   Progression -- Continue to progress workloads to maintain intensity without signs/symptoms of physical distress. Continue to progress workloads to maintain intensity without signs/symptoms of physical distress. Continue to progress workloads to maintain intensity without signs/symptoms of physical distress. Continue to progress workloads to maintain intensity without signs/symptoms of physical distress.   Average METs -- 2.42 2.27 2.08 2.63     Resistance Training   Training Prescription -- Yes Yes Yes Yes   Weight -- 5 lb 5 lb 5 lb 5 lb   Reps -- 10-15 10-15 10-15 10-15     Interval Training   Interval Training -- No No No No     Treadmill   MPH -- 1.5 1.1 1 1.4   Grade -- 0 0 0 1.5   Minutes -- 15 15 15 15    METs -- 2.15 1.84 1.77 2.36     Recumbant Bike   Level -- 3 4 4 4    Watts -- 24 25 23 25    Minutes -- 15 15 15 15    METs -- 2.91 2.95 2.97 2.97     NuStep   Level -- 2  T6 nustep  3  T6 nustep 3  T6 nustep 2  T6   Minutes -- 15 15 15 15    METs -- -- 2 2.2 2.2     T5 Nustep   Level -- 2 3 3 2     Minutes -- 15 15 15 15    METs -- 2.2 2 2  2.97     Oxygen   Maintain Oxygen Saturation -- 88% or higher 88% or higher 88% or higher 88% or higher      Exercise Comments:   Exercise Comments     Row Name 01/14/24 0916           Exercise Comments First full day of exercise!  Patient was oriented to gym and equipment including functions, settings, policies, and procedures.  Patient's individual exercise prescription and treatment plan were reviewed.  All starting workloads were established based on the results of the 6 minute walk test done at initial orientation visit.  The plan for exercise progression was also introduced and progression will be customized based on patient's performance and goals.          Exercise Goals and Review:   Exercise Goals     Row Name 01/07/24 1010             Exercise Goals   Increase Physical Activity Yes       Intervention Provide advice, education, support and counseling about physical activity/exercise needs.;Develop an individualized exercise prescription for aerobic and resistive training based on initial evaluation findings, risk stratification, comorbidities and participant's personal goals.       Expected Outcomes Short Term: Attend rehab on a regular basis to increase amount of physical activity.;Long Term: Add in home exercise to make exercise part of routine and to increase amount of physical activity.;Long Term: Exercising regularly at least 3-5 days a week.       Increase Strength and Stamina Yes       Intervention Provide advice, education, support and counseling about physical activity/exercise needs.;Develop an individualized exercise prescription for aerobic and resistive training based on initial evaluation findings, risk stratification, comorbidities and participant's personal goals.       Expected Outcomes Short Term: Perform resistance training exercises routinely during rehab and add in resistance training at home;Short Term:  Increase workloads from initial exercise prescription for resistance, speed, and METs.;Long Term: Improve cardiorespiratory fitness, muscular endurance and strength as measured by increased METs and functional capacity ( )       Able to understand and use rate of perceived exertion (RPE) scale Yes       Intervention Provide education and explanation on how to use RPE scale       Expected Outcomes Short Term: Able to use RPE daily in rehab to express subjective intensity level;Long Term:  Able to use RPE to guide intensity level when exercising independently       Able to understand and use Dyspnea scale Yes       Intervention Provide education and explanation on how to use Dyspnea scale       Expected Outcomes Long Term: Able to use Dyspnea scale to guide intensity level when exercising independently;Short Term: Able to use Dyspnea scale daily in rehab to express subjective sense of shortness of breath during exertion       Knowledge and understanding of Target Heart Rate Range (THRR) Yes       Intervention Provide education and explanation of THRR including how the numbers were predicted and where they are located for  reference       Expected Outcomes Short Term: Able to state/look up THRR;Long Term: Able to use THRR to govern intensity when exercising independently;Short Term: Able to use daily as guideline for intensity in rehab       Able to check pulse independently Yes       Intervention Provide education and demonstration on how to check pulse in carotid and radial arteries.;Review the importance of being able to check your own pulse for safety during independent exercise       Expected Outcomes Short Term: Able to explain why pulse checking is important during independent exercise;Long Term: Able to check pulse independently and accurately       Understanding of Exercise Prescription Yes       Intervention Provide education, explanation, and written materials on patient's individual exercise  prescription       Expected Outcomes Short Term: Able to explain program exercise prescription;Long Term: Able to explain home exercise prescription to exercise independently          Exercise Goals Re-Evaluation :  Exercise Goals Re-Evaluation     Row Name 01/14/24 9083 01/21/24 1510 02/05/24 1609 02/18/24 1458 03/05/24 1111     Exercise Goal Re-Evaluation   Exercise Goals Review Increase Physical Activity;Able to understand and use rate of perceived exertion (RPE) scale;Knowledge and understanding of Target Heart Rate Range (THRR);Understanding of Exercise Prescription;Increase Strength and Stamina;Able to understand and use Dyspnea scale;Able to check pulse independently Increase Physical Activity;Understanding of Exercise Prescription;Increase Strength and Stamina Increase Physical Activity;Understanding of Exercise Prescription;Increase Strength and Stamina Increase Physical Activity;Understanding of Exercise Prescription;Increase Strength and Stamina Increase Physical Activity;Understanding of Exercise Prescription;Increase Strength and Stamina   Comments Reviewed RPE and dyspnea scale, THR and program prescription with pt today.  Pt voiced understanding and was given a copy of goals to take home. Gottfried is off to a good start in the program and he completed his first week in this review period. He had a workload on the treadmill of a speed of 1.5 mph and no incline. He also worked at level 2 on the T5 and T6 nusteps, and level 3 on the recumbent bike. We will continue to monitor his progress in the program. Barnie is doing well in rehab. He increased to level 4 on the recumbent bike. He also increased to level 3 on the T6 and T5 nustep. We will continue to monitor his progress in the program. Blas is doing well in rehab. He was able to increase to level 4 on the T6 nustep. He maintained level 3 on the T5 nustep and level 4 on the recumbent bike. He walked at a speed of 1 mph on the treadmill with  no incline. We will continue to monitor his progress in the program. Shawnta is doing well in rehab. He increased his workload on the treadmill to a speed of 1.4 mph with 1.5% incline. He maintained level 4 on the recumbent bike. He worked at level 2 on the T5 and T6 nusteps. We will continue to monitor his progress in the program.   Expected Outcomes Short: Use RPE daily to regulate intensity. Long: Follow program prescription in THR. Short: Continue to follow current exercise prescription. Long: Continue exercise to improve strength and stamina. Short: Continue to progressively increase workloads on the recumbent bike and nustep. Long: Continue exercise to improve strength and stamina. Short: Continue to progressively increase workload on the recumbent bike and treadmill. Long: Continue exercise to improve strength and  stamina. Short: Get back to level 3 on the nustep. Long: Continue exercise to improve strength and stamina.      Discharge Exercise Prescription (Final Exercise Prescription Changes):  Exercise Prescription Changes - 03/05/24 1100       Response to Exercise   Blood Pressure (Admit) 116/70    Blood Pressure (Exit) 122/66    Heart Rate (Admit) 89 bpm    Heart Rate (Exercise) 96 bpm    Heart Rate (Exit) 86 bpm    Oxygen Saturation (Admit) 90 %    Oxygen Saturation (Exercise) 87 %   went up to 88% with rest   Oxygen Saturation (Exit) 90 %    Rating of Perceived Exertion (Exercise) 11    Perceived Dyspnea (Exercise) 3    Symptoms none    Duration Progress to 30 minutes of  aerobic without signs/symptoms of physical distress    Intensity THRR unchanged      Progression   Progression Continue to progress workloads to maintain intensity without signs/symptoms of physical distress.    Average METs 2.63      Resistance Training   Training Prescription Yes    Weight 5 lb    Reps 10-15      Interval Training   Interval Training No      Treadmill   MPH 1.4    Grade 1.5     Minutes 15    METs 2.36      Recumbant Bike   Level 4    Watts 25    Minutes 15    METs 2.97      NuStep   Level 2   T6   Minutes 15    METs 2.2      T5 Nustep   Level 2    Minutes 15    METs 2.97      Oxygen   Maintain Oxygen Saturation 88% or higher          Nutrition:  Target Goals: Understanding of nutrition guidelines, daily intake of sodium 1500mg , cholesterol 200mg , calories 30% from fat and 7% or less from saturated fats, daily to have 5 or more servings of fruits and vegetables.  Education: Nutrition 1 -Group instruction provided by verbal, written material, interactive activities, discussions, models, and posters to present general guidelines for heart healthy nutrition including macronutrients, label reading, and promoting whole foods over processed counterparts. Education serves as pensions consultant of discussion of heart healthy eating for all. Written material provided at class time.     Education: Nutrition 2 -Group instruction provided by verbal, written material, interactive activities, discussions, models, and posters to present general guidelines for heart healthy nutrition including sodium, cholesterol, and saturated fat. Providing guidance of habit forming to improve blood pressure, cholesterol, and body weight. Written material provided at class time.     Biometrics:  Pre Biometrics - 01/07/24 1011       Pre Biometrics   Height 5' 9 (1.753 m)    Weight 181 lb 1.6 oz (82.1 kg)    Waist Circumference 38.5 inches    Hip Circumference 37.5 inches    Waist to Hip Ratio 1.03 %    BMI (Calculated) 26.73    Single Leg Stand 18.1 seconds           Nutrition Therapy Plan and Nutrition Goals:  Nutrition Therapy & Goals - 01/07/24 0955       Nutrition Therapy   RD appointment deferred Yes      Intervention Plan  Intervention Prescribe, educate and counsel regarding individualized specific dietary modifications aiming towards targeted core  components such as weight, hypertension, lipid management, diabetes, heart failure and other comorbidities.    Expected Outcomes Short Term Goal: A plan has been developed with personal nutrition goals set during dietitian appointment.;Short Term Goal: Understand basic principles of dietary content, such as calories, fat, sodium, cholesterol and nutrients.;Long Term Goal: Adherence to prescribed nutrition plan.          Nutrition Assessments:  MEDIFICTS Score Key: >=70 Need to make dietary changes  40-70 Heart Healthy Diet <= 40 Therapeutic Level Cholesterol Diet  Flowsheet Row Pulmonary Rehab from 01/07/2024 in [facility] Cardiac and Pulmonary Rehab  Picture Your Plate Total Score on Admission 37   Picture Your Plate Scores: <59 Unhealthy dietary pattern with much room for improvement. 41-50 Dietary pattern unlikely to meet recommendations for good health and room for improvement. 51-60 More healthful dietary pattern, with some room for improvement.  >60 Healthy dietary pattern, although there may be some specific behaviors that could be improved.   Nutrition Goals Re-Evaluation:  Nutrition Goals Re-Evaluation     Row Name 02/04/24 0947 03/10/24 0937           Goals   Comment RD appt. deferred RD appt. deferred         Nutrition Goals Discharge (Final Nutrition Goals Re-Evaluation):  Nutrition Goals Re-Evaluation - 03/10/24 9062       Goals   Comment RD appt. deferred          Psychosocial: Target Goals: Acknowledge presence or absence of significant depression and/or stress, maximize coping skills, provide positive support system. Participant is able to verbalize types and ability to use techniques and skills needed for reducing stress and depression.   Education: Stress, Anxiety, and Depression - Group verbal and visual presentation to define topics covered.  Reviews how body is impacted by stress, anxiety, and depression.  Also discusses healthy ways to reduce stress  and to treat/manage anxiety and depression.  Written material provided at class time.   Education: Sleep Hygiene -Provides group verbal and written instruction about how sleep can affect your health.  Define sleep hygiene, discuss sleep cycles and impact of sleep habits. Review good sleep hygiene tips.    Initial Review & Psychosocial Screening:  Initial Psych Review & Screening - 12/25/23 1014       Initial Review   Current issues with None Identified      Family Dynamics   Good Support System? Yes      Barriers   Psychosocial barriers to participate in program There are no identifiable barriers or psychosocial needs.      Screening Interventions   Interventions Encouraged to exercise;To provide support and resources with identified psychosocial needs;Provide feedback about the scores to participant    Expected Outcomes Short Term goal: Utilizing psychosocial counselor, staff and physician to assist with identification of specific Stressors or current issues interfering with healing process. Setting desired goal for each stressor or current issue identified.;Long Term Goal: Stressors or current issues are controlled or eliminated.;Short Term goal: Identification and review with participant of any Quality of Life or Depression concerns found by scoring the questionnaire.;Long Term goal: The participant improves quality of Life and PHQ9 Scores as seen by post scores and/or verbalization of changes          Quality of Life Scores:  Scores of 19 and below usually indicate a poorer quality of life in these areas.  A  difference of  2-3 points is a clinically meaningful difference.  A difference of 2-3 points in the total score of the Quality of Life Index has been associated with significant improvement in overall quality of life, self-image, physical symptoms, and general health in studies assessing change in quality of life.  PHQ-9: Review Flowsheet  More data exists      02/04/2024  01/07/2024 12/19/2023 05/28/2023 02/05/2023  Depression screen PHQ 2/9  Decreased Interest 1 2 0 0 0  Down, Depressed, Hopeless 0 0 0 0 0  PHQ - 2 Score 1 2 0 0 0  Altered sleeping 0 0 0 - 0  Tired, decreased energy 2 3 0 - 0  Change in appetite 2 3 0 - 0  Feeling bad or failure about yourself  0 0 0 - 0  Trouble concentrating 0 0 0 - 0  Moving slowly or fidgety/restless 0 0 0 - 0  Suicidal thoughts 0 0 0 - 0  PHQ-9 Score 5 8  0  - 0   Difficult doing work/chores Somewhat difficult Very difficult Not difficult at all - Not difficult at all    Details       Data saved with a previous flowsheet row definition        Interpretation of Total Score  Total Score Depression Severity:  1-4 = Minimal depression, 5-9 = Mild depression, 10-14 = Moderate depression, 15-19 = Moderately severe depression, 20-27 = Severe depression   Psychosocial Evaluation and Intervention:  Psychosocial Evaluation - 12/25/23 1014       Psychosocial Evaluation & Interventions   Interventions Encouraged to exercise with the program and follow exercise prescription    Comments Mr. Nin is coming to pulmonary rehab with COPD. He states his shortness of breath has gotten worse over the last year and is ready to try this program to see if it will help. 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 He does not have any stress concerns at this time.    Expected Outcomes Short: attend pulmonary rehab for education and exercise Long: develop and maintain positive self care habits    Continue Psychosocial Services  Follow up required by staff          Psychosocial Re-Evaluation:  Psychosocial Re-Evaluation     Row Name 02/04/24 0944 03/10/24 0935           Psychosocial Re-Evaluation   Current issues with None Identified None Identified      Comments Jaysen is doing well and denies having any stress at this time. He has a good support system made up of his wife. He reports  having no sleep concerns at this time, he states that he wakes up occasionally but it doesnt bother him. Kaysan continues to do well and does not have any stress at this time. he states that he gets regular sleep, other waking up a few times to use the bathroom.      Expected Outcomes Short: Continue to utilize support system, and exercise to relieve stress. Long: Continue to attend [facility] to manage stress. Short: Continue to utilize supports system to deal with stress. Long: Continue to attend [facility] to manage stress.      Interventions Encouraged to attend Pulmonary Rehabilitation for the exercise Encouraged to attend Pulmonary Rehabilitation for the exercise      Continue Psychosocial Services  Follow up required by staff Follow up required by staff  Psychosocial Discharge (Final Psychosocial Re-Evaluation):  Psychosocial Re-Evaluation - 03/10/24 0935       Psychosocial Re-Evaluation   Current issues with None Identified    Comments Librado continues to do well and does not have any stress at this time. he states that he gets regular sleep, other waking up a few times to use the bathroom.    Expected Outcomes Short: Continue to utilize supports system to deal with stress. Long: Continue to attend [facility] to manage stress.    Interventions Encouraged to attend Pulmonary Rehabilitation for the exercise    Continue Psychosocial Services  Follow up required by staff          Education: Education Goals: Education classes will be provided on a weekly basis, covering required topics. Participant will state understanding/return demonstration of topics presented.  Learning Barriers/Preferences:  Learning Barriers/Preferences - 12/25/23 1009       Learning Barriers/Preferences   Learning Barriers None    Learning Preferences None          General Pulmonary Education Topics:  Infection Prevention: - Provides verbal and written material to individual with discussion of  infection control including proper hand washing and proper equipment cleaning during exercise session. Flowsheet Row Pulmonary Rehab from 01/07/2024 in [facility] Cardiac and Pulmonary Rehab  Date 01/07/24  Educator NT  Instruction Review Code 1- Verbalizes Understanding    Falls Prevention: - Provides verbal and written material to individual with discussion of falls prevention and safety. Flowsheet Row Pulmonary Rehab from 01/07/2024 in [facility] Cardiac and Pulmonary Rehab  Date 01/07/24  Educator NT  Instruction Review Code 1- Verbalizes Understanding    Chronic Lung Disease Review: - Group verbal instruction with posters, models, PowerPoint presentations and videos,  to review new updates, new respiratory medications, new advancements in procedures and treatments. Providing information on websites and 800 numbers for continued self-education. Includes information about supplement oxygen, available portable oxygen systems, continuous and intermittent flow rates, oxygen safety, concentrators, and Medicare reimbursement for oxygen. Explanation of Pulmonary Drugs, including class, frequency, complications, importance of spacers, rinsing mouth after steroid MDI's, and proper cleaning methods for nebulizers. Review of basic lung anatomy and physiology related to function, structure, and complications of lung disease. Review of risk factors. Discussion about methods for diagnosing sleep apnea and types of masks and machines for OSA. Includes a review of the use of types of environmental controls: home humidity, furnaces, filters, dust mite/pet prevention, HEPA vacuums. Discussion about weather changes, air quality and the benefits of nasal washing. Instruction on Warning signs, infection symptoms, calling MD promptly, preventive modes, and value of vaccinations. Review of effective airway clearance, coughing and/or vibration techniques. Emphasizing that all should Create an Action Plan. Written material  provided at class time. Flowsheet Row Pulmonary Rehab from 01/07/2024 in [facility] Cardiac and Pulmonary Rehab  Education need identified 01/07/24    AED/CPR: - Group verbal and written instruction with the use of models to demonstrate the basic use of the AED with the basic ABC's of resuscitation.    Tests and Procedures:  - Group verbal and visual presentation and models provide information about basic cardiac anatomy and function. Reviews the testing methods done to diagnose heart disease and the outcomes of the test results. Describes the treatment choices: Medical Management, Angioplasty, or Coronary Bypass Surgery for treating various heart conditions including Myocardial Infarction, Angina, Valve Disease, and Cardiac Arrhythmias.  Written material provided at class time.   Medication Safety: - Group verbal and visual instruction to review  commonly prescribed medications for heart and lung disease. Reviews the medication, class of the drug, and side effects. Includes the steps to properly store meds and maintain the prescription regimen.  Written material given at graduation.   Other: -Provides group and verbal instruction on various topics (see comments)   Knowledge Questionnaire Score:  Knowledge Questionnaire Score - 01/07/24 0954       Knowledge Questionnaire Score   Pre Score 13/18           Core Components/Risk Factors/Patient Goals at Admission:  Personal Goals and Risk Factors at Admission - 12/25/23 1008       Core Components/Risk Factors/Patient Goals on Admission    Weight Management Weight Maintenance;Yes    Intervention Weight Management: Provide education and appropriate resources to help participant work on and attain dietary goals.;Weight Management: Develop a combined nutrition and exercise program designed to reach desired caloric intake, while maintaining appropriate intake of nutrient and fiber, sodium and fats, and appropriate energy expenditure required  for the weight goal.    Expected Outcomes Long Term: Adherence to nutrition and physical activity/exercise program aimed toward attainment of established weight goal;Short Term: Continue to assess and modify interventions until short term weight is achieved;Weight Maintenance: Understanding of the daily nutrition guidelines, which includes 25-35% calories from fat, 7% or less cal from saturated fats, less than 200mg  cholesterol, less than 1.5gm of sodium, & 5 or more servings of fruits and vegetables daily;Understanding recommendations for meals to include 15-35% energy as protein, 25-35% energy from fat, 35-60% energy from carbohydrates, less than 200mg  of dietary cholesterol, 20-35 gm of total fiber daily;Understanding of distribution of calorie intake throughout the day with the consumption of 4-5 meals/snacks    Diabetes Yes    Intervention Provide education about signs/symptoms and action to take for hypo/hyperglycemia.;Provide education about proper nutrition, including hydration, and aerobic/resistive exercise prescription along with prescribed medications to achieve blood glucose in normal ranges: Fasting glucose 65-99 mg/dL    Expected Outcomes Short Term: Participant verbalizes understanding of the signs/symptoms and immediate care of hyper/hypoglycemia, proper foot care and importance of medication, aerobic/resistive exercise and nutrition plan for blood glucose control.;Long Term: Attainment of HbA1C < 7%.    Hypertension Yes    Intervention Provide education on lifestyle modifcations including regular physical activity/exercise, weight management, moderate sodium restriction and increased consumption of fresh fruit, vegetables, and low fat dairy, alcohol moderation, and smoking cessation.;Monitor prescription use compliance.    Expected Outcomes Short Term: Continued assessment and intervention until BP is < 140/52mm HG in hypertensive participants. < 130/53mm HG in hypertensive participants with  diabetes, heart failure or chronic kidney disease.;Long Term: Maintenance of blood pressure at goal levels.    Lipids Yes    Intervention Provide education and support for participant on nutrition & aerobic/resistive exercise along with prescribed medications to achieve LDL 70mg , HDL >40mg .    Expected Outcomes Short Term: Participant states understanding of desired cholesterol values and is compliant with medications prescribed. Participant is following exercise prescription and nutrition guidelines.;Long Term: Cholesterol controlled with medications as prescribed, with individualized exercise RX and with personalized nutrition plan. Value goals: LDL < 70mg , HDL > 40 mg.          Education:Diabetes - Individual verbal and written instruction to review signs/symptoms of diabetes, desired ranges of glucose level fasting, after meals and with exercise. Acknowledge that pre and post exercise glucose checks will be done for 3 sessions at entry of program. Flowsheet Row Pulmonary Rehab from 01/07/2024  in [facility] Cardiac and Pulmonary Rehab  Date 12/25/23  Educator [facility]  Instruction Review Code 1- Verbalizes Understanding    Know Your Numbers and Heart Failure: - Group verbal and visual instruction to discuss disease risk factors for cardiac and pulmonary disease and treatment options.  Reviews associated critical values for Overweight/Obesity, Hypertension, Cholesterol, and Diabetes.  Discusses basics of heart failure: signs/symptoms and treatments.  Introduces Heart Failure Zone chart for action plan for heart failure. Written material provided at class time.   Core Components/Risk Factors/Patient Goals Review:   Goals and Risk Factor Review     Row Name 02/04/24 0948 03/10/24 0937           Core Components/Risk Factors/Patient Goals Review   Personal Goals Review Weight Management/Obesity;Diabetes;Hypertension Weight Management/Obesity;Diabetes;Hypertension      Review Edilson states that he  would like to lose some weight but it is not a huge priority for him today, and he is comfortable with maintaining. He is taking both his BP and blood sugar every day. He states that his systolic BP stays around 110, and his blood sugar is in the mid 100's. Dalyn is comfortable maintaining his weight at this time. He is not checking his BP at home, and does not have access to a cuff. We discussed the improtance of obtaining a cuff and checking his BP regularly to manage his hypertension. Delma is checking his blood sugar everyday, and it continues to be in a healthy range. He continues to take all of his medications as prescribed.      Expected Outcomes Short: Continue to take BP and sugar daily, maintain weight. Long: Continue to exercise and diet in order to manage hypertension, and weight goals. Short: Continue to take BP and sugar daily, maintain weight. Long: Continue to exercise and diet in order to manage hypertension, and weight goals.         Core Components/Risk Factors/Patient Goals at Discharge (Final Review):   Goals and Risk Factor Review - 03/10/24 0937       Core Components/Risk Factors/Patient Goals Review   Personal Goals Review Weight Management/Obesity;Diabetes;Hypertension    Review Sasan is comfortable maintaining his weight at this time. He is not checking his BP at home, and does not have access to a cuff. We discussed the improtance of obtaining a cuff and checking his BP regularly to manage his hypertension. Todd is checking his blood sugar everyday, and it continues to be in a healthy range. He continues to take all of his medications as prescribed.    Expected Outcomes Short: Continue to take BP and sugar daily, maintain weight. Long: Continue to exercise and diet in order to manage hypertension, and weight goals.          ITP Comments:  ITP Comments     Row Name 12/25/23 1016 01/07/24 0929 01/14/24 0916 01/15/24 0944 02/12/24 1125   ITP Comments Initial phone call  completed. Diagnosis can be found in [facility] 9/15. EP Orientation scheduled for Tuesday 10/14 at 8am. Completed and gym orientation for pulmonary rehab. Initial ITP created and sent for review to Dr. Fuad Aleskerov, Medical Director. First full day of exercise!  Patient was oriented to gym and equipment including functions, settings, policies, and procedures.  Patient's individual exercise prescription and treatment plan were reviewed.  All starting workloads were established based on the results of the 6 minute walk test done at initial orientation visit.  The plan for exercise progression was also introduced and progression will  be customized based on patient's performance and goals. 30 Day review completed. Medical Director ITP review done, changes made as directed, and signed approval by Medical Director. 30 Day review completed. Medical Director ITP review done, changes made as directed, and signed approval by Medical Director.    Row Name 03/11/24 1320           ITP Comments 30 Day review completed. Medical Director ITP review done, changes made as directed, and signed approval by Medical Director.          Comments: 30 Day Review      [1]  Current Outpatient Medications:    albuterol  (PROVENTIL ) (2.5 MG/3ML) 0.083% nebulizer solution, USE ONE ampule via NEBULIZER EVERY 6 HOURS AS NEEDED SHORTNESS OF BREATH OR wheezing, Disp: 180 mL, Rfl: 3   albuterol  (VENTOLIN  HFA) 108 (90 Base) MCG/ACT inhaler, INHALE 1 TO 2 PUFFS BY MOUTH EVERY 4 HOURS AS NEEDED FOR WHEEZING OR FOR SHORTNESS OF BREATH, Disp: 8.5 g, Rfl: 1   amLODipine  (NORVASC ) 10 MG tablet, Take 1 tablet (10 mg total) by mouth daily., Disp: 90 tablet, Rfl: 1   aspirin 81 MG tablet, Take 81 mg by mouth., Disp: , Rfl:    atorvastatin  (LIPITOR) 40 MG tablet, Take 1 tablet (40 mg total) by mouth daily., Disp: 90 tablet, Rfl: 1   B-D UF III MINI PEN NEEDLES 31G X 5 MM MISC, , Disp: , Rfl:    benzonatate  (TESSALON ) 100 MG capsule, Take 1  capsule (100 mg total) by mouth 2 (two) times daily as needed for cough., Disp: 20 capsule, Rfl: 0   bisoprolol  (ZEBETA ) 5 MG tablet, TAKE ONE TABLET BY MOUTH TWICE DAILY, Disp: 60 tablet, Rfl: 2   budesonide-glycopyrrolate -formoterol  (BREZTRI  AEROSPHERE) 160-9-4.8 MCG/ACT AERO inhaler, Inhale 2 puffs into the lungs 2 (two) times daily., Disp: 10.7 g, Rfl: 11   budesonide-glycopyrrolate -formoterol  (BREZTRI  AEROSPHERE) 160-9-4.8 MCG/ACT AERO inhaler, Inhale 2 puffs into the lungs in the morning and at bedtime., Disp: 10.8 g, Rfl:    dicyclomine  (BENTYL ) 10 MG capsule, TAKE ONE CAPSULE BY MOUTH THREE TIMES DAILY AS NEEDED FOR MUSCLE SPASMS, Disp: 270 capsule, Rfl: 0   Dulaglutide 0.75 MG/0.5ML SOPN, Inject 3 mg into the skin once a week., Disp: , Rfl:    Dupilumab  (DUPIXENT ) 300 MG/2ML SOAJ, Inject 300 mg into the skin every 14 (fourteen) days., Disp: 4 mL, Rfl: 3   glucose blood (ONETOUCH VERIO) test strip, , Disp: , Rfl:    insulin degludec (TRESIBA) 100 UNIT/ML FlexTouch Pen, Inject 26 Units into the skin daily., Disp: , Rfl:    losartan -hydrochlorothiazide (HYZAAR) 100-12.5 MG tablet, Take 1 tablet by mouth daily., Disp: 90 tablet, Rfl: 1   pantoprazole  (PROTONIX ) 20 MG tablet, TAKE ONE TABLET BY MOUTH ONCE DAILY, Disp: 90 tablet, Rfl: 1   sildenafil  (VIAGRA ) 100 MG tablet, Take 1 tablet (100 mg total) by mouth as needed for erectile dysfunction., Disp: 30 tablet, Rfl: 5  Current Facility-Administered Medications:    ipratropium-albuterol  (DUONEB) 0.5-2.5 (3) MG/3ML nebulizer solution 3 mL, 3 mL, Nebulization, Once,  [2]  Social History Tobacco Use  Smoking Status Former   Current packs/day: 0.00   Average packs/day: 1 pack/day for 41.0 years (41.0 ttl pk-yrs)   Types: Cigarettes   Start date: 03/13/1981   Quit date: 03/13/2022   Years since quitting: 1.9   Passive exposure: Past  Smokeless Tobacco Never  Tobacco Comments   Smoked last on 12/11.  Trying to quit.   Using patches.  HFB   03/13/2022

## 2024-03-12 ENCOUNTER — Encounter

## 2024-03-12 DIAGNOSIS — J449 Chronic obstructive pulmonary disease, unspecified: Secondary | ICD-10-CM

## 2024-03-12 NOTE — Progress Notes (Signed)
 Daily Session Note  Patient Details  Name: Zachary Wallace MRN: [id number] Date of Birth: Sep 14, 1955 Referring Provider:   Flowsheet Row Pulmonary Rehab from 01/07/2024 in [facility] Cardiac and Pulmonary Rehab  Referring Provider Dr. Darrin Barn, MD    Encounter Date: 03/12/2024  Check In:  Session Check In - 03/12/24 0941       Check-In   Supervising physician immediately available to respond to emergencies See telemetry face sheet for immediately available ER MD    Location [facility]-Cardiac & Pulmonary Rehab    Staff Present Leita Franks RN,BSN;[facility] BS, Exercise Physiologist;Margaret Best, MS, Exercise Physiologist    Virtual Visit No    Medication changes reported     No    Fall or balance concerns reported    No    Tobacco Cessation No Change    Warm-up and Cool-down Performed on first and last piece of equipment    Resistance Training Performed Yes    VAD Patient? No    PAD/SET Patient? No      Pain Assessment   Currently in Pain? No/denies             Tobacco Use History[1]  Goals Met:  Independence with exercise equipment Exercise tolerated well No report of concerns or symptoms today Strength training completed today  Goals Unmet:  Not Applicable  Comments: Pt able to follow exercise prescription today without complaint.  Will continue to monitor for progression.    Dr. Oneil Pinal is Medical Director for [facility] Cardiac Rehabilitation.  Dr. Fuad Aleskerov is Medical Director for [facility] Pulmonary Rehabilitation.    [1]  Social History Tobacco Use  Smoking Status Former   Current packs/day: 0.00   Average packs/day: 1 pack/day for 41.0 years (41.0 ttl pk-yrs)   Types: Cigarettes   Start date: 03/13/1981   Quit date: 03/13/2022   Years since quitting: 2.0   Passive exposure: Past  Smokeless Tobacco Never  Tobacco Comments   Smoked last on 12/11.  Trying to quit.   Using patches.  HFB  03/13/2022

## 2024-03-15 ENCOUNTER — Other Ambulatory Visit: Payer: Self-pay | Admitting: Internal Medicine

## 2024-03-15 DIAGNOSIS — I10 Essential (primary) hypertension: Secondary | ICD-10-CM

## 2024-03-16 ENCOUNTER — Other Ambulatory Visit: Payer: Self-pay

## 2024-03-16 NOTE — Progress Notes (Signed)
 Specialty Pharmacy Refill Coordination Note  Zachary Wallace is a 68 y.o. male contacted today regarding refills of specialty medication(s) Dupilumab  (Dupixent )   Patient requested Delivery   Delivery date: 03/24/24   Verified address: [address], [place], [place] [address]   Medication will be filled on: 03/23/24

## 2024-03-17 ENCOUNTER — Encounter

## 2024-03-17 DIAGNOSIS — J449 Chronic obstructive pulmonary disease, unspecified: Secondary | ICD-10-CM | POA: Diagnosis not present

## 2024-03-17 NOTE — Progress Notes (Signed)
 Daily Session Note  Patient Details  Name: LISLE SKILLMAN MRN: [id number] Date of Birth: 1955-10-25 Referring Provider:   Flowsheet Row Pulmonary Rehab from 01/07/2024 in [facility] Cardiac and Pulmonary Rehab  Referring Provider Dr. Darrin Barn, MD    Encounter Date: 03/17/2024  Check In:  Session Check In - 03/17/24 0909       Check-In   Supervising physician immediately available to respond to emergencies See telemetry face sheet for immediately available ER MD    Location [facility]-Cardiac & Pulmonary Rehab    Staff Present Burnard Davenport RN,BSN,MPA;Laura Cates RN,BSN;Kristen Coble RN,BC,MSN;Jason [facility]    Virtual Visit No    Medication changes reported     No    Fall or balance concerns reported    No    Tobacco Cessation No Change    Warm-up and Cool-down Performed on first and last piece of equipment    Resistance Training Performed Yes    VAD Patient? No    PAD/SET Patient? No      Pain Assessment   Currently in Pain? No/denies             Tobacco Use History[1]  Goals Met:  Proper associated with RPD/PD & O2 Sat Independence with exercise equipment Using PLB without cueing & demonstrates good technique Exercise tolerated well No report of concerns or symptoms today Strength training completed today  Goals Unmet:  Not Applicable  Comments: Pt able to follow exercise prescription today without complaint.  Will continue to monitor for progression.    Dr. Oneil Pinal is Medical Director for [facility] Cardiac Rehabilitation.  Dr. Fuad Aleskerov is Medical Director for [facility] Pulmonary Rehabilitation.    [1]  Social History Tobacco Use  Smoking Status Former   Current packs/day: 0.00   Average packs/day: 1 pack/day for 41.0 years (41.0 ttl pk-yrs)   Types: Cigarettes   Start date: 03/13/1981   Quit date: 03/13/2022   Years since quitting: 2.0   Passive exposure: Past  Smokeless Tobacco Never  Tobacco Comments   Smoked last on 12/11.   Trying to quit.   Using patches.  HFB  03/13/2022

## 2024-03-18 NOTE — Telephone Encounter (Signed)
 Requested Prescriptions  Pending Prescriptions Disp Refills   bisoprolol  (ZEBETA ) 5 MG tablet [Pharmacy Med Name: bisoprolol  fumarate 5 mg tablet] 60 tablet 2    Sig: TAKE ONE TABLET BY MOUTH TWICE DAILY     Cardiovascular: Beta Blockers 2 Failed - 03/18/2024 10:24 AM      Failed - Cr in normal range and within 360 days    Creat  Date Value Ref Range Status  06/28/2022 2.45 (H) 0.70 - 1.35 mg/dL Final   Creatinine, Urine  Date Value Ref Range Status  05/24/2023 87.5  Final         Passed - Last BP in normal range    BP Readings from Last 1 Encounters:  03/09/24 126/80         Passed - Last Heart Rate in normal range    Pulse Readings from Last 1 Encounters:  03/09/24 77         Passed - Valid encounter within last 6 months    Recent Outpatient Visits           3 months ago Essential hypertension   [facility] Bernardo Fend, DO   4 months ago COPD exacerbation [facility])   [facility] Gareth Mliss FALCON, FNP   5 months ago COPD exacerbation [facility])   [facility] Bernardo Fend, DO   7 months ago COPD exacerbation [facility])   [facility] Bernardo Fend, DO   8 months ago Bilateral impacted cerumen   [facility] Bernardo Fend, [place]

## 2024-03-23 ENCOUNTER — Other Ambulatory Visit: Payer: Self-pay

## 2024-03-24 ENCOUNTER — Encounter

## 2024-03-24 DIAGNOSIS — J449 Chronic obstructive pulmonary disease, unspecified: Secondary | ICD-10-CM

## 2024-03-24 NOTE — Progress Notes (Signed)
 Daily Session Note  Patient Details  Name: Zachary Wallace MRN: [id number] Date of Birth: 09-Mar-1956 Referring Provider:   Flowsheet Row Pulmonary Rehab from 01/07/2024 in [facility] Cardiac and Pulmonary Rehab  Referring Provider Dr. Darrin Barn, MD    Encounter Date: 03/24/2024  Check In:  Session Check In - 03/24/24 0921       Check-In   Supervising physician immediately available to respond to emergencies See telemetry face sheet for immediately available ER MD    Location [facility]-Cardiac & Pulmonary Rehab    Staff Present Burnard Davenport RN,BSN,MPA;Maxon Conetta BS, Exercise Physiologist;Margaret Best, MS, Exercise Physiologist;Meredith Tressa RN,BSN    Virtual Visit No    Medication changes reported     No    Fall or balance concerns reported    No    Tobacco Cessation No Change    Warm-up and Cool-down Performed on first and last piece of equipment    Resistance Training Performed Yes    VAD Patient? No    PAD/SET Patient? No      Pain Assessment   Currently in Pain? No/denies             Tobacco Use History[1]  Goals Met:  Proper associated with RPD/PD & O2 Sat Independence with exercise equipment Using PLB without cueing & demonstrates good technique Exercise tolerated well No report of concerns or symptoms today Strength training completed today  Goals Unmet:  Not Applicable  Comments: Pt able to follow exercise prescription today without complaint.  Will continue to monitor for progression.    Dr. Oneil Pinal is Medical Director for [facility] Cardiac Rehabilitation.  Dr. Fuad Aleskerov is Medical Director for [facility] Pulmonary Rehabilitation.    [1]  Social History Tobacco Use  Smoking Status Former   Current packs/day: 0.00   Average packs/day: 1 pack/day for 41.0 years (41.0 ttl pk-yrs)   Types: Cigarettes   Start date: 03/13/1981   Quit date: 03/13/2022   Years since quitting: 2.0   Passive exposure: Past  Smokeless Tobacco Never   Tobacco Comments   Smoked last on 12/11.  Trying to quit.   Using patches.  HFB  03/13/2022

## 2024-03-31 ENCOUNTER — Encounter: Attending: Pulmonary Disease

## 2024-03-31 DIAGNOSIS — J449 Chronic obstructive pulmonary disease, unspecified: Secondary | ICD-10-CM | POA: Insufficient documentation

## 2024-03-31 NOTE — Progress Notes (Signed)
 Daily Session Note  Patient Details  Name: Zachary Wallace MRN: [id number] Date of Birth: December 03, 1955 Referring Provider:   Flowsheet Row Pulmonary Rehab from 01/07/2024 in [facility] Cardiac and Pulmonary Rehab  Referring Provider Dr. Darrin Barn, MD    Encounter Date: 03/31/2024  Check In:  Session Check In - 03/31/24 0912       Check-In   Supervising physician immediately available to respond to emergencies See telemetry face sheet for immediately available ER MD    Location [facility]-Cardiac & Pulmonary Rehab    Staff Present Burnard Davenport RN,BSN,MPA;Margaret Best, MS, Exercise Physiologist;Noah Tickle, BS, Exercise Physiologist;Jason Elnor RDN,LDN;Maxon Conetta BS, Exercise Physiologist    Virtual Visit No    Medication changes reported     No    Fall or balance concerns reported    No    Tobacco Cessation No Change    Warm-up and Cool-down Performed on first and last piece of equipment    Resistance Training Performed Yes    VAD Patient? No    PAD/SET Patient? No      Pain Assessment   Currently in Pain? No/denies             Tobacco Use History[1]  Goals Met:  Proper associated with RPD/PD & O2 Sat Independence with exercise equipment Using PLB without cueing & demonstrates good technique Exercise tolerated well No report of concerns or symptoms today Strength training completed today  Goals Unmet:  Not Applicable  Comments: Pt able to follow exercise prescription today without complaint.  Will continue to monitor for progression.    Dr. Oneil Pinal is Medical Director for [facility] Cardiac Rehabilitation.  Dr. Fuad Aleskerov is Medical Director for [facility] Pulmonary Rehabilitation.    [1]  Social History Tobacco Use  Smoking Status Former   Current packs/day: 0.00   Average packs/day: 1 pack/day for 41.0 years (41.0 ttl pk-yrs)   Types: Cigarettes   Start date: 03/13/1981   Quit date: 03/13/2022   Years since quitting: 2.0   Passive exposure:  Past  Smokeless Tobacco Never  Tobacco Comments   Smoked last on 12/11.  Trying to quit.   Using patches.  HFB  03/13/2022

## 2024-04-02 ENCOUNTER — Encounter: Admitting: Emergency Medicine

## 2024-04-02 DIAGNOSIS — J449 Chronic obstructive pulmonary disease, unspecified: Secondary | ICD-10-CM | POA: Diagnosis not present

## 2024-04-02 NOTE — Progress Notes (Signed)
 Daily Session Note  Patient Details  Name: Zachary Wallace MRN: [id number] Date of Birth: [birth year] Referring Provider:   Flowsheet Row Pulmonary Rehab from 01/07/2024 in [facility] Cardiac and Pulmonary Rehab  Referring Provider Dr. Darrin Barn, MD    Encounter Date: 04/02/2024  Check In:  Session Check In - 04/02/24 0938       Check-In   Supervising physician immediately available to respond to emergencies See telemetry face sheet for immediately available ER MD    Location [facility]-Cardiac & Pulmonary Rehab    Staff Present Leita Franks RN,BSN;Joseph [facility] [place], [place], Exercise Physiologist    Virtual Visit No    Medication changes reported     No    Fall or balance concerns reported    No    Tobacco Cessation No Change    Warm-up and Cool-down Performed on first and last piece of equipment    Resistance Training Performed Yes    VAD Patient? No    PAD/SET Patient? No      Pain Assessment   Currently in Pain? No/denies             Tobacco Use History[1]  Goals Met:  Proper associated with RPD/PD & O2 Sat Independence with exercise equipment Using PLB without cueing & demonstrates good technique Exercise tolerated well No report of concerns or symptoms today Strength training completed today  Goals Unmet:  Not Applicable  Comments: Pt able to follow exercise prescription today without complaint.  Will continue to monitor for progression.    Dr. Oneil Pinal is Medical Director for [facility] Cardiac Rehabilitation.  Dr. Fuad Aleskerov is Medical Director for [facility] Pulmonary Rehabilitation.    [1]  Social History Tobacco Use  Smoking Status Former   Current packs/day: 0.00   Average packs/day: 1 pack/day for 41.0 years (41.0 ttl pk-yrs)   Types: Cigarettes   Start date: 03/13/1981   Quit date: 03/13/2022   Years since quitting: 2.0   Passive exposure: Past  Smokeless Tobacco Never  Tobacco Comments   Smoked last on 12/11.  Trying to  quit.   Using patches.  HFB  03/13/2022

## 2024-04-07 ENCOUNTER — Encounter

## 2024-04-07 DIAGNOSIS — J449 Chronic obstructive pulmonary disease, unspecified: Secondary | ICD-10-CM

## 2024-04-07 NOTE — Progress Notes (Signed)
 Daily Session Note  Patient Details  Name: Zachary Wallace MRN: [id number] Date of Birth: 05/02/1955 Referring Provider:   Flowsheet Row Pulmonary Rehab from 01/07/2024 in [facility] Cardiac and Pulmonary Rehab  Referring Provider Dr. Darrin Barn, MD    Encounter Date: 04/07/2024  Check In:  Session Check In - 04/07/24 0915       Check-In   Supervising physician immediately available to respond to emergencies See telemetry face sheet for immediately available ER MD    Location [facility]-Cardiac & Pulmonary Rehab    Staff Present Maxon Conetta BS, Exercise Physiologist;Margaret Best, MS, Exercise Physiologist;Jason Elnor RDN,LDN    Virtual Visit No    Medication changes reported     No    Fall or balance concerns reported    No    Tobacco Cessation No Change    Warm-up and Cool-down Performed on first and last piece of equipment    Resistance Training Performed Yes    VAD Patient? No    PAD/SET Patient? No      Pain Assessment   Currently in Pain? No/denies             Tobacco Use History[1]  Goals Met:  Proper associated with RPD/PD & O2 Sat Independence with exercise equipment Using PLB without cueing & demonstrates good technique Exercise tolerated well No report of concerns or symptoms today Strength training completed today  Goals Unmet:  Not Applicable  Comments: Pt able to follow exercise prescription today without complaint.  Will continue to monitor for progression.    Dr. Oneil Pinal is Medical Director for [facility] Cardiac Rehabilitation.  Dr. Fuad Aleskerov is Medical Director for [facility] Pulmonary Rehabilitation.    [1]  Social History Tobacco Use  Smoking Status Former   Current packs/day: 0.00   Average packs/day: 1 pack/day for 41.0 years (41.0 ttl pk-yrs)   Types: Cigarettes   Start date: 03/13/1981   Quit date: 03/13/2022   Years since quitting: 2.0   Passive exposure: Past  Smokeless Tobacco Never  Tobacco Comments   Smoked last on  12/11.  Trying to quit.   Using patches.  HFB  03/13/2022

## 2024-04-08 ENCOUNTER — Ambulatory Visit (INDEPENDENT_AMBULATORY_CARE_PROVIDER_SITE_OTHER): Admitting: Family Medicine

## 2024-04-08 ENCOUNTER — Ambulatory Visit: Payer: Self-pay | Admitting: *Deleted

## 2024-04-08 ENCOUNTER — Encounter: Payer: Self-pay | Admitting: Family Medicine

## 2024-04-08 DIAGNOSIS — J441 Chronic obstructive pulmonary disease with (acute) exacerbation: Secondary | ICD-10-CM

## 2024-04-08 DIAGNOSIS — J449 Chronic obstructive pulmonary disease, unspecified: Secondary | ICD-10-CM

## 2024-04-08 MED ORDER — PREDNISONE 20 MG PO TABS: 40.0000 mg | ORAL_TABLET | Freq: Every day | ORAL | 0 refills | Status: AC

## 2024-04-08 NOTE — Progress Notes (Signed)
 "     Established patient visit   Patient: Zachary Wallace   DOB: 07/14/1955   69 y.o. Male  MRN: [id number] Visit Date: 04/08/2024  Today's healthcare provider: Nancyann Perry, MD   Chief Complaint  Patient presents with   Acute Visit    Sob, body sore due to a lot of coughing ongoing 3 days no otc medication   Subjective    Discussed the use of AI scribe software for clinical note transcription with the patient, who gave verbal consent to proceed.  History of Present Illness   Zachary Wallace is a 69 year old male with bronchitis who presents with cough and sinus congestion.  He has been experiencing symptoms for approximately three to four days, including a productive cough with occasional sputum. He has a history of bronchitis and has had similar symptoms in the past, which were typically managed with prednisone .  He is currently using Breztri  and Albuterol  inhalers. He has not needed to increase his use of Albuterol  recently. He attributes some improvement to participation in robotic classes.  He reports nasal congestion that worsens when lying down, with mostly yellow nasal discharge. No pressure across his eyes. No fevers, chills, or sweats.  He has a history of recurrent bronchitis and pneumonia throughout his life. He reports that his A1c readings have been consistently 5.8 over the past two years.       Medications: Show/hide medication list[1] Review of Systems  Constitutional:  Negative for appetite change, chills and fever.  Respiratory:  Negative for chest tightness, shortness of breath and wheezing.   Cardiovascular:  Negative for chest pain and palpitations.  Gastrointestinal:  Negative for abdominal pain, nausea and vomiting.       Objective    BP (!) 142/78   Pulse 82   Resp 16   Ht 5' 9 (1.753 m)   Wt 181 lb (82.1 kg)   SpO2 97%   BMI 26.73 kg/m   Physical Exam   General: Appearance:    Well developed, well nourished male in no acute distress  Eyes:     PERRL, conjunctiva/corneas clear, EOM's intact       Lungs:     Occasional wheezes, no rales, respirations unlabored  Heart:    Normal heart rate. Normal rhythm. No murmurs, rubs, or gallops.    MS:   All extremities are intact.    Neurologic:   Awake, alert, oriented x 3. No apparent focal neurological defect.          Assessment & Plan    1. COPD with acute exacerbation (HCC) No symptoms of bacterial infection. Oximetry is good on room air.  - predniSONE  (DELTASONE ) 20 MG tablet; Take 2 tablets (40 mg total) by mouth daily for 5 days.  Dispense: 10 tablet; Refill: 0  Call if symptoms change or if not rapidly improving.         Nancyann Perry, MD  [facility] [phone] (phone) [phone] (fax)  [facility]    [1]  Outpatient Medications Prior to Visit  Medication Sig   albuterol  (PROVENTIL ) (2.5 MG/3ML) 0.083% nebulizer solution USE ONE ampule via NEBULIZER EVERY 6 HOURS AS NEEDED SHORTNESS OF BREATH OR wheezing   albuterol  (VENTOLIN  HFA) 108 (90 Base) MCG/ACT inhaler INHALE 1 TO 2 PUFFS BY MOUTH EVERY 4 HOURS AS NEEDED FOR WHEEZING OR FOR SHORTNESS OF BREATH   amLODipine  (NORVASC ) 10 MG tablet Take 1 tablet (10 mg total) by mouth  daily.   aspirin 81 MG tablet Take 81 mg by mouth.   atorvastatin  (LIPITOR) 40 MG tablet Take 1 tablet (40 mg total) by mouth daily.   B-D UF III MINI PEN NEEDLES 31G X 5 MM MISC    benzonatate  (TESSALON ) 100 MG capsule Take 1 capsule (100 mg total) by mouth 2 (two) times daily as needed for cough.   bisoprolol  (ZEBETA ) 5 MG tablet TAKE ONE TABLET BY MOUTH TWICE DAILY   budesonide-glycopyrrolate -formoterol  (BREZTRI  AEROSPHERE) 160-9-4.8 MCG/ACT AERO inhaler Inhale 2 puffs into the lungs 2 (two) times daily.   dicyclomine  (BENTYL ) 10 MG capsule TAKE ONE CAPSULE BY MOUTH THREE TIMES DAILY AS NEEDED FOR MUSCLE SPASMS   Dulaglutide 0.75 MG/0.5ML SOPN Inject 3 mg into the skin once a week.   Dupilumab   (DUPIXENT ) 300 MG/2ML SOAJ Inject 300 mg into the skin every 14 (fourteen) days.   glucose blood (ONETOUCH VERIO) test strip    insulin degludec (TRESIBA) 100 UNIT/ML FlexTouch Pen Inject 26 Units into the skin daily.   losartan -hydrochlorothiazide (HYZAAR) 100-12.5 MG tablet Take 1 tablet by mouth daily.   pantoprazole  (PROTONIX ) 20 MG tablet TAKE ONE TABLET BY MOUTH ONCE DAILY   sildenafil  (VIAGRA ) 100 MG tablet Take 1 tablet (100 mg total) by mouth as needed for erectile dysfunction.   [DISCONTINUED] budesonide-glycopyrrolate -formoterol  (BREZTRI  AEROSPHERE) 160-9-4.8 MCG/ACT AERO inhaler Inhale 2 puffs into the lungs in the morning and at bedtime.   Facility-Administered Medications Prior to Visit  Medication Dose Route Frequency Provider   ipratropium-albuterol  (DUONEB) 0.5-2.5 (3) MG/3ML nebulizer solution 3 mL  3 mL Nebulization Once    "

## 2024-04-08 NOTE — Telephone Encounter (Signed)
 No available appt with PCP or other providers today. Scheduled same day acute appt at [facility] with Dr. Gasper.    FYI Only or Action Required?: FYI only for provider: appointment scheduled on 04/08/24.  Patient was last seen in primary care on 11/28/2023 by Bernardo Fend, DO.  Called Nurse Triage reporting Shortness of Breath.  Symptoms began several days ago.  Interventions attempted: Prescription medications: bretzi, albuterol  inhaler.  Symptoms are: gradually worsening.  Triage Disposition: See HCP Within 4 Hours (Or PCP Triage)  Patient/caregiver understands and will follow disposition?: Yes                 Copied from CRM [id number]. Topic: Clinical - Red Word Triage >> Apr 08, 2024  7:38 AM Tobias CROME wrote: Red Word that prompted transfer to Nurse Triage: asthma flare, can hardly breathe Reason for Disposition  [1] MILD difficulty breathing (e.g., minimal/no SOB at rest, SOB with walking, pulse < 100) AND [2] NEW-onset or WORSE than normal  Answer Assessment - Initial Assessment Questions No available appt today with PCP or other providers in office. Scheduled appt same day acute with [facility] today. Recommended if sx worsen go to ED or call 911.       1. RESPIRATORY STATUS: Describe your breathing? (e.g., wheezing, shortness of breath, unable to speak, severe coughing)      Difficulty catching breath at times. Shortness of breath and pain in low back coughing 2. ONSET: When did this breathing problem begin?      Sunday  3. PATTERN Does the difficult breathing come and go, or has it been constant since it started?      Constant  4. SEVERITY: How bad is your breathing? (e.g., mild, moderate, severe)      Did not rate 5. RECURRENT SYMPTOM: Have you had difficulty breathing before? If Yes, ask: When was the last time? and What happened that time?      Yes  6. CARDIAC HISTORY: Do you have any history of heart disease? (e.g., heart attack, angina,  bypass surgery, angioplasty)      See hx  7. LUNG HISTORY: Do you have any history of lung disease?  (e.g., pulmonary embolus, asthma, emphysema)     Hx asthma 8. CAUSE: What do you think is causing the breathing problem?      Not sure  9. OTHER SYMPTOMS: Do you have any other symptoms? (e.g., chest pain, cough, dizziness, fever, runny nose)     Runny nose, cough low back pain from coughing. Not sleeping, shortness of breath , difficulty catching breath at times. No chest pain, no fever no body aches. 10. O2 SATURATION MONITOR:  Do you use an oxygen saturation monitor (pulse oximeter) at home? If Yes, ask: What is your reading (oxygen level) today? What is your usual oxygen saturation reading? (e.g., 95%)       na 11. PREGNANCY: Is there any chance you are pregnant? When was your last menstrual period?       na 12. TRAVEL: Have you traveled out of the country in the last month? (e.g., travel history, exposures)       na  Protocols used: Breathing Difficulty-A-AH

## 2024-04-08 NOTE — Progress Notes (Signed)
 Pulmonary Individual Treatment Plan  Patient Details  Name: Zachary Wallace MRN: [id number] Date of Birth: Jan 01, 1956 Referring Provider:   Flowsheet Row Pulmonary Rehab from 01/07/2024 in [facility] Cardiac and Pulmonary Rehab  Referring Provider Dr. Darrin Barn, MD    Initial Encounter Date:  Flowsheet Row Pulmonary Rehab from 01/07/2024 in [facility] Cardiac and Pulmonary Rehab  Date 01/07/24    Visit Diagnosis: Chronic obstructive pulmonary disease, unspecified COPD type (HCC)  Patient's Home Medications on Admission: Current Medications[1]  Past Medical History: Past Medical History:  Diagnosis Date   COPD (chronic obstructive pulmonary disease) (HCC)    Diabetes mellitus without complication (HCC)    Hyperlipidemia    Hypertension    OSA (obstructive sleep apnea)     Tobacco Use: Tobacco Use History[2]  Labs: Review Flowsheet  More data exists      Latest Ref Rng & Units 06/28/2022 07/02/2022 11/23/2022 05/24/2023 11/28/2023  Labs for ITP Cardiac and Pulmonary Rehab  Cholestrol 0 - 200 mg/dL - 893  - - -  LDL (calc) 0 - 99 mg/dL - 43  - - -  HDL-C >59 mg/dL - 41  - - -  Trlycerides <150 mg/dL - 888  - - -  Hemoglobin A1c 4.0 - 5.6 % 9.4  - 6.9     7.6     7.4     Details       This result is from an external source.          Pulmonary Assessment Scores:  Pulmonary Assessment Scores     Row Name 01/07/24 0941         ADL UCSD   ADL Phase Entry     SOB Score total 86     Rest 3     Walk 3     Stairs 3     Bath 5     Dress 4     Shop 2       CAT Score   CAT Score 21       mMRC Score   mMRC Score 2        UCSD: Self-administered rating of dyspnea associated with activities of daily living (ADLs) 6-point scale (0 = not at all to 5 = maximal or unable to do because of breathlessness)  Scoring Scores range from 0 to 120.  Minimally important difference is 5 units  CAT: CAT can identify the health impairment of COPD patients and is better  correlated with disease progression.  CAT has a scoring range of zero to 40. The CAT score is classified into four groups of low (less than 10), medium (10 - 20), high (21-30) and very high (31-40) based on the impact level of disease on health status. A CAT score over 10 suggests significant symptoms.  A worsening CAT score could be explained by an exacerbation, poor medication adherence, poor inhaler technique, or progression of COPD or comorbid conditions.  CAT MCID is 2 points  mMRC: mMRC (Modified Medical Research Council) Dyspnea Scale is used to assess the degree of baseline functional disability in patients of respiratory disease due to dyspnea. No minimal important difference is established. A decrease in score of 1 point or greater is considered a positive change.   Pulmonary Function Assessment:   Exercise Target Goals: Exercise Program Goal: Individual exercise prescription set using results from initial 6 min walk test and THRR while considering  patients activity barriers and safety.   Exercise Prescription Goal: Initial exercise prescription  builds to 30-45 minutes a day of aerobic activity, 2-3 days per week.  Home exercise guidelines will be given to patient during program as part of exercise prescription that the participant will acknowledge.  Education: Aerobic Exercise: - Group verbal and visual presentation on the components of exercise prescription. Introduces F.I.T.T principle from ACSM for exercise prescriptions.  Reviews F.I.T.T. principles of aerobic exercise including progression. Written material provided at class time.   Education: Resistance Exercise: - Group verbal and visual presentation on the components of exercise prescription. Introduces F.I.T.T principle from ACSM for exercise prescriptions  Reviews F.I.T.T. principles of resistance exercise including progression. Written material provided at class time.    Education: Exercise & Equipment Safety: -  Individual verbal instruction and demonstration of equipment use and safety with use of the equipment. Flowsheet Row Pulmonary Rehab from 01/07/2024 in [facility] Cardiac and Pulmonary Rehab  Date 01/07/24  Educator NT  Instruction Review Code 1- Verbalizes Understanding    Education: Exercise Physiology & General Exercise Guidelines: - Group verbal and written instruction with models to review the exercise physiology of the cardiovascular system and associated critical values. Provides general exercise guidelines with specific guidelines to those with heart or lung disease.    Education: Flexibility, Balance, Mind/Body Relaxation: - Group verbal and visual presentation with interactive activity on the components of exercise prescription. Introduces F.I.T.T principle from ACSM for exercise prescriptions. Reviews F.I.T.T. principles of flexibility and balance exercise training including progression. Also discusses the mind body connection.  Reviews various relaxation techniques to help reduce and manage stress (i.e. Deep breathing, progressive muscle relaxation, and visualization). Balance handout provided to take home. Written material provided at class time.   Activity Barriers & Risk Stratification:  Activity Barriers & Cardiac Risk Stratification - 12/25/23 1003       Activity Barriers & Cardiac Risk Stratification   Activity Barriers Left Knee Replacement          6 Minute Walk:  6 Minute Walk     Row Name 01/07/24 1007         6 Minute Walk   Phase Initial     Distance 1145 feet     Walk Time 6 minutes     # of Rest Breaks 0     MPH 2.17     METS 2.92     RPE 11     Perceived Dyspnea  3     VO2 Peak 10.22     Symptoms Yes (comment)     Comments SOB     Resting HR 95 bpm     Resting BP 112/68     Resting Oxygen Saturation  93 %     Exercise Oxygen Saturation  during 6 min walk 83 %     Max Ex. HR 105 bpm     Max Ex. BP 124/66     2 Minute Post BP 118/66        Interval HR   1 Minute HR 98     2 Minute HR 101     3 Minute HR 102     4 Minute HR 101     5 Minute HR 105     6 Minute HR 104     2 Minute Post HR 88     Interval Heart Rate? Yes       Interval Oxygen   Interval Oxygen? Yes     Baseline Oxygen Saturation % 93 %     1 Minute Oxygen Saturation %  86 %     1 Minute Liters of Oxygen 0 L  RA     2 Minute Oxygen Saturation % 85 %     2 Minute Liters of Oxygen 0 L     3 Minute Oxygen Saturation % 83 %     3 Minute Liters of Oxygen 0 L     4 Minute Oxygen Saturation % 84 %     4 Minute Liters of Oxygen 0 L     5 Minute Oxygen Saturation % 85 %     5 Minute Liters of Oxygen 0 L     6 Minute Oxygen Saturation % 84 %     6 Minute Liters of Oxygen 0 L     2 Minute Post Oxygen Saturation % 92 %     2 Minute Post Liters of Oxygen 0 L       Oxygen Initial Assessment:  Oxygen Initial Assessment - 12/25/23 1006       Home Oxygen   Home Oxygen Device None    Sleep Oxygen Prescription None    Home Exercise Oxygen Prescription None    Home Resting Oxygen Prescription None    Compliance with Home Oxygen Use Yes      Intervention   Short Term Goals To learn and understand importance of monitoring SPO2 with pulse oximeter and demonstrate accurate use of the pulse oximeter.;To learn and understand importance of maintaining oxygen saturations>88%;To learn and demonstrate proper pursed lip breathing techniques or other breathing techniques. ;To learn and demonstrate proper use of respiratory medications    Long  Term Goals Verbalizes importance of monitoring SPO2 with pulse oximeter and return demonstration;Maintenance of O2 saturations>88%;Exhibits proper breathing techniques, such as pursed lip breathing or other method taught during program session;Compliance with respiratory medication;Demonstrates proper use of MDIs          Oxygen Re-Evaluation:  Oxygen Re-Evaluation     Row Name 01/14/24 0917 02/04/24 0951 03/10/24 0937 03/31/24  0926       Program Oxygen Prescription   Program Oxygen Prescription -- None None None      Home Oxygen   Home Oxygen Device None None None None    Sleep Oxygen Prescription None None None None    Home Exercise Oxygen Prescription None None -- None    Home Resting Oxygen Prescription None None None None    Compliance with Home Oxygen Use Yes Yes Yes Yes      Goals/Expected Outcomes   Short Term Goals To learn and understand importance of monitoring SPO2 with pulse oximeter and demonstrate accurate use of the pulse oximeter.;To learn and understand importance of maintaining oxygen saturations>88%;To learn and demonstrate proper pursed lip breathing techniques or other breathing techniques. ;To learn and demonstrate proper use of respiratory medications -- To learn and understand importance of monitoring SPO2 with pulse oximeter and demonstrate accurate use of the pulse oximeter.;To learn and understand importance of maintaining oxygen saturations>88%;To learn and demonstrate proper pursed lip breathing techniques or other breathing techniques. ;To learn and demonstrate proper use of respiratory medications To learn and understand importance of monitoring SPO2 with pulse oximeter and demonstrate accurate use of the pulse oximeter.;To learn and understand importance of maintaining oxygen saturations>88%;To learn and demonstrate proper pursed lip breathing techniques or other breathing techniques. ;To learn and demonstrate proper use of respiratory medications    Long  Term Goals Verbalizes importance of monitoring SPO2 with pulse oximeter and return demonstration;Maintenance of O2 saturations>88%;Exhibits proper breathing  techniques, such as pursed lip breathing or other method taught during program session;Compliance with respiratory medication;Demonstrates proper use of MDIs -- Verbalizes importance of monitoring SPO2 with pulse oximeter and return demonstration;Maintenance of O2  saturations>88%;Exhibits proper breathing techniques, such as pursed lip breathing or other method taught during program session;Compliance with respiratory medication;Demonstrates proper use of MDIs Verbalizes importance of monitoring SPO2 with pulse oximeter and return demonstration;Maintenance of O2 saturations>88%;Exhibits proper breathing techniques, such as pursed lip breathing or other method taught during program session;Compliance with respiratory medication;Demonstrates proper use of MDIs    Comments Reviewed PLB technique with pt.  Talked about how it works and it's importance in maintaining their exercise saturations. Antwoin is not using any supplemental O2 in the program or at home. He has access to a pulse ox at home to monitor his saturation. We reviewed PLB, and he continues to use techniques when he is in breathing distress, as well as his medications. Kailer is not using any supplemental O2 in the program or at home. He has access to a pulse ox at home to monitor his saturation. We reviewed PLB, and he continues to use techniques when he is in breathing distress, as well as his medications. Lea is not using any supplemental O2 in the program or at home. He has access to a pulse ox at home to monitor his saturation, and has started using it while walking with his wife and doing yardwork. We reviewed PLB, and he continues to use techniques when he is in breathing distress, as well as his medications.    Goals/Expected Outcomes Short: Become more profiecient at using PLB. Long: Become independent at using PLB. Short: Continue to check O2 sats when doing yardwork. Long: Continue to exercise, use PLB techniques, and take medication to manage breathing. Short: Continue to check O2 sats when doing yardwork. Long: Continue to exercise, use PLB techniques, and take medication to manage breathing. Short: Continue to check O2 sats when doing yardwork. Long: Continue to exercise, use PLB techniques, and  take medication to manage breathing.       Oxygen Discharge (Final Oxygen Re-Evaluation):  Oxygen Re-Evaluation - 03/31/24 0926       Program Oxygen Prescription   Program Oxygen Prescription None      Home Oxygen   Home Oxygen Device None    Sleep Oxygen Prescription None    Home Exercise Oxygen Prescription None    Home Resting Oxygen Prescription None    Compliance with Home Oxygen Use Yes      Goals/Expected Outcomes   Short Term Goals To learn and understand importance of monitoring SPO2 with pulse oximeter and demonstrate accurate use of the pulse oximeter.;To learn and understand importance of maintaining oxygen saturations>88%;To learn and demonstrate proper pursed lip breathing techniques or other breathing techniques. ;To learn and demonstrate proper use of respiratory medications    Long  Term Goals Verbalizes importance of monitoring SPO2 with pulse oximeter and return demonstration;Maintenance of O2 saturations>88%;Exhibits proper breathing techniques, such as pursed lip breathing or other method taught during program session;Compliance with respiratory medication;Demonstrates proper use of MDIs    Comments Wyland is not using any supplemental O2 in the program or at home. He has access to a pulse ox at home to monitor his saturation, and has started using it while walking with his wife and doing yardwork. We reviewed PLB, and he continues to use techniques when he is in breathing distress, as well as his medications.    Goals/Expected  Outcomes Short: Continue to check O2 sats when doing yardwork. Long: Continue to exercise, use PLB techniques, and take medication to manage breathing.          Initial Exercise Prescription:  Initial Exercise Prescription - 01/07/24 1000       Date of Initial Exercise RX and Referring Provider   Date 01/07/24    Referring Provider Dr. Darrin Barn, MD      Oxygen   Maintain Oxygen Saturation 88% or higher      Treadmill    MPH 2    Grade 0    Minutes 15    METs 2.53      Recumbant Bike   Level 2    RPM 50    Watts 24    Minutes 15    METs 2.92      NuStep   Level 2   T6 nustep   SPM 80    Minutes 15    METs 2.92      T5 Nustep   Level 1    SPM 80    Minutes 15    METs 2.92      Prescription Details   Frequency (times per week) 2    Duration Progress to 30 minutes of continuous aerobic without signs/symptoms of physical distress      Intensity   THRR 40-80% of Max Heartrate 118-142    Ratings of Perceived Exertion 11-13    Perceived Dyspnea 0-4      Progression   Progression Continue to progress workloads to maintain intensity without signs/symptoms of physical distress.      Resistance Training   Training Prescription Yes    Weight 5 lb    Reps 10-15          Perform Capillary Blood Glucose checks as needed.  Exercise Prescription Changes:   Exercise Prescription Changes     Row Name 01/07/24 1000 01/21/24 1500 02/05/24 1600 02/18/24 1400 03/05/24 1100     Response to Exercise   Blood Pressure (Admit) 112/68 108/66 120/70 126/64 116/70   Blood Pressure (Exercise) 124/66 124/62 132/64 128/64 --   Blood Pressure (Exit) 118/66 112/62 104/62 116/60 122/66   Heart Rate (Admit) 95 bpm 84 bpm 80 bpm 74 bpm 89 bpm   Heart Rate (Exercise) 105 bpm 106 bpm 105 bpm 94 bpm 96 bpm   Heart Rate (Exit) 88 bpm 92 bpm 81 bpm 87 bpm 86 bpm   Oxygen Saturation (Admit) 93 % 91 % 91 % 93 % 90 %   Oxygen Saturation (Exercise) 83 % 88 % 88 % 88 % 87 %  went up to 88% with rest   Oxygen Saturation (Exit) 92 % 90 % 95 % 90 % 90 %   Rating of Perceived Exertion (Exercise) 11 15 14 11 11    Perceived Dyspnea (Exercise) 3 4 1 2 3    Symptoms SOB none none none none   Comments Results 1st week of exercise sessions -- -- --   Duration -- Progress to 30 minutes of  aerobic without signs/symptoms of physical distress Progress to 30 minutes of  aerobic without signs/symptoms of physical distress  Progress to 30 minutes of  aerobic without signs/symptoms of physical distress Progress to 30 minutes of  aerobic without signs/symptoms of physical distress   Intensity -- THRR unchanged THRR unchanged THRR unchanged THRR unchanged     Progression   Progression -- Continue to progress workloads to maintain intensity without signs/symptoms of physical distress.  Continue to progress workloads to maintain intensity without signs/symptoms of physical distress. Continue to progress workloads to maintain intensity without signs/symptoms of physical distress. Continue to progress workloads to maintain intensity without signs/symptoms of physical distress.   Average METs -- 2.42 2.27 2.08 2.63     Resistance Training   Training Prescription -- Yes Yes Yes Yes   Weight -- 5 lb 5 lb 5 lb 5 lb   Reps -- 10-15 10-15 10-15 10-15     Interval Training   Interval Training -- No No No No     Treadmill   MPH -- 1.5 1.1 1 1.4   Grade -- 0 0 0 1.5   Minutes -- 15 15 15 15    METs -- 2.15 1.84 1.77 2.36     Recumbant Bike   Level -- 3 4 4 4    Watts -- 24 25 23 25    Minutes -- 15 15 15 15    METs -- 2.91 2.95 2.97 2.97     NuStep   Level -- 2  T6 nustep 3  T6 nustep 3  T6 nustep 2  T6   Minutes -- 15 15 15 15    METs -- -- 2 2.2 2.2     T5 Nustep   Level -- 2 3 3 2    Minutes -- 15 15 15 15    METs -- 2.2 2 2  2.97     Oxygen   Maintain Oxygen Saturation -- 88% or higher 88% or higher 88% or higher 88% or higher    Row Name 03/25/24 1600 03/30/24 1100           Response to Exercise   Blood Pressure (Admit) 126/60 118/70      Blood Pressure (Exercise) 142/70 128/72      Blood Pressure (Exit) 102/62 104/64      Heart Rate (Admit) 78 bpm 84 bpm      Heart Rate (Exercise) 91 bpm 92 bpm      Heart Rate (Exit) 92 bpm 86 bpm      Oxygen Saturation (Admit) 90 % 91 %      Oxygen Saturation (Exercise) 89 % 88 %      Oxygen Saturation (Exit) 91 % 94 %      Rating of Perceived Exertion (Exercise) 11  10      Perceived Dyspnea (Exercise) 3 1      Symptoms none none      Duration Progress to 30 minutes of  aerobic without signs/symptoms of physical distress Progress to 30 minutes of  aerobic without signs/symptoms of physical distress      Intensity THRR unchanged THRR unchanged        Progression   Progression Continue to progress workloads to maintain intensity without signs/symptoms of physical distress. Continue to progress workloads to maintain intensity without signs/symptoms of physical distress.      Average METs 2.24 2.03        Resistance Training   Training Prescription Yes --      Weight 5 lb 5 lb      Reps 10-15 10-15        Interval Training   Interval Training No No        Treadmill   MPH 1.4 1.3      Grade 1.5 0      Minutes 15 15      METs 2.36 1.99        Recumbant Bike   Level 3 --  Watts 20 --      Minutes 15 --      METs 2.77 --        NuStep   Level 1  T6 2      Minutes 15 15      METs 2.1 2.2        T5 Nustep   Level 3 2  T6      Minutes 15 15      METs 2.1 --        Oxygen   Maintain Oxygen Saturation 88% or higher 88% or higher         Exercise Comments:   Exercise Comments     Row Name 01/14/24 0916           Exercise Comments First full day of exercise!  Patient was oriented to gym and equipment including functions, settings, policies, and procedures.  Patient's individual exercise prescription and treatment plan were reviewed.  All starting workloads were established based on the results of the 6 minute walk test done at initial orientation visit.  The plan for exercise progression was also introduced and progression will be customized based on patient's performance and goals.          Exercise Goals and Review:   Exercise Goals     Row Name 01/07/24 1010             Exercise Goals   Increase Physical Activity Yes       Intervention Provide advice, education, support and counseling about physical activity/exercise  needs.;Develop an individualized exercise prescription for aerobic and resistive training based on initial evaluation findings, risk stratification, comorbidities and participant's personal goals.       Expected Outcomes Short Term: Attend rehab on a regular basis to increase amount of physical activity.;Long Term: Add in home exercise to make exercise part of routine and to increase amount of physical activity.;Long Term: Exercising regularly at least 3-5 days a week.       Increase Strength and Stamina Yes       Intervention Provide advice, education, support and counseling about physical activity/exercise needs.;Develop an individualized exercise prescription for aerobic and resistive training based on initial evaluation findings, risk stratification, comorbidities and participant's personal goals.       Expected Outcomes Short Term: Perform resistance training exercises routinely during rehab and add in resistance training at home;Short Term: Increase workloads from initial exercise prescription for resistance, speed, and METs.;Long Term: Improve cardiorespiratory fitness, muscular endurance and strength as measured by increased METs and functional capacity ( )       Able to understand and use rate of perceived exertion (RPE) scale Yes       Intervention Provide education and explanation on how to use RPE scale       Expected Outcomes Short Term: Able to use RPE daily in rehab to express subjective intensity level;Long Term:  Able to use RPE to guide intensity level when exercising independently       Able to understand and use Dyspnea scale Yes       Intervention Provide education and explanation on how to use Dyspnea scale       Expected Outcomes Long Term: Able to use Dyspnea scale to guide intensity level when exercising independently;Short Term: Able to use Dyspnea scale daily in rehab to express subjective sense of shortness of breath during exertion       Knowledge and understanding of  Target Heart Rate Range (THRR) Yes  Intervention Provide education and explanation of THRR including how the numbers were predicted and where they are located for reference       Expected Outcomes Short Term: Able to state/look up THRR;Long Term: Able to use THRR to govern intensity when exercising independently;Short Term: Able to use daily as guideline for intensity in rehab       Able to check pulse independently Yes       Intervention Provide education and demonstration on how to check pulse in carotid and radial arteries.;Review the importance of being able to check your own pulse for safety during independent exercise       Expected Outcomes Short Term: Able to explain why pulse checking is important during independent exercise;Long Term: Able to check pulse independently and accurately       Understanding of Exercise Prescription Yes       Intervention Provide education, explanation, and written materials on patient's individual exercise prescription       Expected Outcomes Short Term: Able to explain program exercise prescription;Long Term: Able to explain home exercise prescription to exercise independently          Exercise Goals Re-Evaluation :  Exercise Goals Re-Evaluation     Row Name 01/14/24 9083 01/21/24 1510 02/05/24 1609 02/18/24 1458 03/05/24 1111     Exercise Goal Re-Evaluation   Exercise Goals Review Increase Physical Activity;Able to understand and use rate of perceived exertion (RPE) scale;Knowledge and understanding of Target Heart Rate Range (THRR);Understanding of Exercise Prescription;Increase Strength and Stamina;Able to understand and use Dyspnea scale;Able to check pulse independently Increase Physical Activity;Understanding of Exercise Prescription;Increase Strength and Stamina Increase Physical Activity;Understanding of Exercise Prescription;Increase Strength and Stamina Increase Physical Activity;Understanding of Exercise Prescription;Increase Strength and  Stamina Increase Physical Activity;Understanding of Exercise Prescription;Increase Strength and Stamina   Comments Reviewed RPE and dyspnea scale, THR and program prescription with pt today.  Pt voiced understanding and was given a copy of goals to take home. Coron is off to a good start in the program and he completed his first week in this review period. He had a workload on the treadmill of a speed of 1.5 mph and no incline. He also worked at level 2 on the T5 and T6 nusteps, and level 3 on the recumbent bike. We will continue to monitor his progress in the program. Timothey is doing well in rehab. He increased to level 4 on the recumbent bike. He also increased to level 3 on the T6 and T5 nustep. We will continue to monitor his progress in the program. Kortland is doing well in rehab. He was able to increase to level 4 on the T6 nustep. He maintained level 3 on the T5 nustep and level 4 on the recumbent bike. He walked at a speed of 1 mph on the treadmill with no incline. We will continue to monitor his progress in the program. Nelton is doing well in rehab. He increased his workload on the treadmill to a speed of 1.4 mph with 1.5% incline. He maintained level 4 on the recumbent bike. He worked at level 2 on the T5 and T6 nusteps. We will continue to monitor his progress in the program.   Expected Outcomes Short: Use RPE daily to regulate intensity. Long: Follow program prescription in [facility]. Short: Continue to follow current exercise prescription. Long: Continue exercise to improve strength and stamina. Short: Continue to progressively increase workloads on the recumbent bike and nustep. Long: Continue exercise to improve strength and stamina. Short:  Continue to progressively increase workload on the recumbent bike and treadmill. Long: Continue exercise to improve strength and stamina. Short: Get back to level 3 on the nustep. Long: Continue exercise to improve strength and stamina.    Row Name 03/25/24 1619  03/30/24 1123           Exercise Goal Re-Evaluation   Exercise Goals Review Increase Physical Activity;Understanding of Exercise Prescription;Increase Strength and Stamina Increase Physical Activity;Understanding of Exercise Prescription;Increase Strength and Stamina      Comments Izak is doing well in rehab. He maintained his workload on the treadmill to a speed of 1.4 mph with 1.5% incline. He increased to level 3 on the T5 nustep. He worked at level 2 on the recumbent bike and level 1 on the T6 nustep. We will continue to monitor his progress in the program. Mcadoo continues to do well in rehab. He was only able to attend 2 sessions during this review period, and was able to use the treadmill at a speed of 1. and no incline. We will continue to monitor his progress in the program.      Expected Outcomes Short: Continue to progressively increase nustep workloads. Long: Continue exercise to improve strength and stamina. Short: Continue to progressively increase nustep workloads. Long: Continue exercise to improve strength and stamina.         Discharge Exercise Prescription (Final Exercise Prescription Changes):  Exercise Prescription Changes - 03/30/24 1100       Response to Exercise   Blood Pressure (Admit) 118/70    Blood Pressure (Exercise) 128/72    Blood Pressure (Exit) 104/64    Heart Rate (Admit) 84 bpm    Heart Rate (Exercise) 92 bpm    Heart Rate (Exit) 86 bpm    Oxygen Saturation (Admit) 91 %    Oxygen Saturation (Exercise) 88 %    Oxygen Saturation (Exit) 94 %    Rating of Perceived Exertion (Exercise) 10    Perceived Dyspnea (Exercise) 1    Symptoms none    Duration Progress to 30 minutes of  aerobic without signs/symptoms of physical distress    Intensity THRR unchanged      Progression   Progression Continue to progress workloads to maintain intensity without signs/symptoms of physical distress.    Average METs 2.03      Resistance Training   Weight 5 lb     Reps 10-15      Interval Training   Interval Training No      Treadmill   MPH 1.3    Grade 0    Minutes 15    METs 1.99      NuStep   Level 2    Minutes 15    METs 2.2      T5 Nustep   Level 2   T6   Minutes 15      Oxygen   Maintain Oxygen Saturation 88% or higher          Nutrition:  Target Goals: Understanding of nutrition guidelines, daily intake of sodium 1500mg , cholesterol 200mg , calories 30% from fat and 7% or less from saturated fats, daily to have 5 or more servings of fruits and vegetables.  Education: Nutrition 1 -Group instruction provided by verbal, written material, interactive activities, discussions, models, and posters to present general guidelines for heart healthy nutrition including macronutrients, label reading, and promoting whole foods over processed counterparts. Education serves as pensions consultant of discussion of heart healthy eating for all. Written material provided at  class time.     Education: Nutrition 2 -Group instruction provided by verbal, written material, interactive activities, discussions, models, and posters to present general guidelines for heart healthy nutrition including sodium, cholesterol, and saturated fat. Providing guidance of habit forming to improve blood pressure, cholesterol, and body weight. Written material provided at class time.     Biometrics:  Pre Biometrics - 01/07/24 1011       Pre Biometrics   Height 5' 9 (1.753 m)    Weight 181 lb 1.6 oz (82.1 kg)    Waist Circumference 38.5 inches    Hip Circumference 37.5 inches    Waist to Hip Ratio 1.03 %    BMI (Calculated) 26.73    Single Leg Stand 18.1 seconds           Nutrition Therapy Plan and Nutrition Goals:  Nutrition Therapy & Goals - 01/07/24 0955       Nutrition Therapy   RD appointment deferred Yes      Intervention Plan   Intervention Prescribe, educate and counsel regarding individualized specific dietary modifications aiming towards  targeted core components such as weight, hypertension, lipid management, diabetes, heart failure and other comorbidities.    Expected Outcomes Short Term Goal: A plan has been developed with personal nutrition goals set during dietitian appointment.;Short Term Goal: Understand basic principles of dietary content, such as calories, fat, sodium, cholesterol and nutrients.;Long Term Goal: Adherence to prescribed nutrition plan.          Nutrition Assessments:  MEDIFICTS Score Key: >=70 Need to make dietary changes  40-70 Heart Healthy Diet <= 40 Therapeutic Level Cholesterol Diet  Flowsheet Row Pulmonary Rehab from [facility]/07/2024 in [facility]  Picture Your Plate Total Score on Admission 37   Picture Your Plate Scores: <59 Unhealthy dietary pattern with much room for improvement. 41-50 Dietary pattern unlikely to meet recommendations for good health and room for improvement. 51-60 More healthful dietary pattern, with some room for improvement.  >60 Healthy dietary pattern, although there may be some specific behaviors that could be improved.   Nutrition Goals Re-Evaluation:  Nutrition Goals Re-Evaluation     Row Name 02/04/24 0947 03/10/24 0937 03/31/24 0923         Goals   Comment RD appt. deferred RD appt. deferred RD appt. deferred        Nutrition Goals Discharge (Final Nutrition Goals Re-Evaluation):  Nutrition Goals Re-Evaluation - 03/31/24 9076       Goals   Comment RD appt. deferred          Psychosocial: Target Goals: Acknowledge presence or absence of significant depression and/or stress, maximize coping skills, provide positive support system. Participant is able to verbalize types and ability to use techniques and skills needed for reducing stress and depression.   Education: Stress, Anxiety, and Depression - Group verbal and visual presentation to define topics covered.  Reviews how body is impacted by stress, anxiety, and depression.   Also discusses healthy ways to reduce stress and to treat/manage anxiety and depression.  Written material provided at class time.   Education: Sleep Hygiene -Provides group verbal and written instruction about how sleep can affect your health.  Define sleep hygiene, discuss sleep cycles and impact of sleep habits. Review good sleep hygiene tips.    Initial Review & Psychosocial Screening:  Initial Psych Review & Screening - 12/25/23 1014       Initial Review   Current issues with None Identified  Family Dynamics   Good Support System? Yes      Barriers   Psychosocial barriers to participate in program There are no identifiable barriers or psychosocial needs.      Screening Interventions   Interventions Encouraged to exercise;To provide support and resources with identified psychosocial needs;Provide feedback about the scores to participant    Expected Outcomes Short Term goal: Utilizing psychosocial counselor, staff and physician to assist with identification of specific Stressors or current issues interfering with healing process. Setting desired goal for each stressor or current issue identified.;Long Term Goal: Stressors or current issues are controlled or eliminated.;Short Term goal: Identification and review with participant of any Quality of Life or Depression concerns found by scoring the questionnaire.;Long Term goal: The participant improves quality of Life and PHQ9 Scores as seen by post scores and/or verbalization of changes          Quality of Life Scores:  Scores of 19 and below usually indicate a poorer quality of life in these areas.  A difference of  2-3 points is a clinically meaningful difference.  A difference of 2-3 points in the total score of the Quality of Life Index has been associated with significant improvement in overall quality of life, self-image, physical symptoms, and general health in studies assessing change in quality of life.  PHQ-9: Review  Flowsheet  More data exists      02/04/2024 01/07/2024 12/19/2023 05/28/2023 02/05/2023  Depression screen PHQ 2/9  Decreased Interest 1 2 0 0 0  Down, Depressed, Hopeless 0 0 0 0 0  PHQ - 2 Score 1 2 0 0 0  Altered sleeping 0 0 0 - 0  Tired, decreased energy 2 3 0 - 0  Change in appetite 2 3 0 - 0  Feeling bad or failure about yourself  0 0 0 - 0  Trouble concentrating 0 0 0 - 0  Moving slowly or fidgety/restless 0 0 0 - 0  Suicidal thoughts 0 0 0 - 0  PHQ-9 Score 5 8  0  - 0   Difficult doing work/chores Somewhat difficult Very difficult Not difficult at all - Not difficult at all    Details       Data saved with a previous flowsheet row definition        Interpretation of Total Score  Total Score Depression Severity:  1-4 = Minimal depression, 5-9 = Mild depression, 10-14 = Moderate depression, 15-19 = Moderately severe depression, 20-27 = Severe depression   Psychosocial Evaluation and Intervention:  Psychosocial Evaluation - 12/25/23 1014       Psychosocial Evaluation & Interventions   Interventions Encouraged to exercise with the program and follow exercise prescription    Comments Mr. Edgin is coming to pulmonary rehab with COPD. He states his shortness of breath has gotten worse over the last year and is ready to try this program to see if it will help. 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 He does not have any stress concerns at this time.    Expected Outcomes Short: attend pulmonary rehab for education and exercise Long: develop and maintain positive self care habits    Continue Psychosocial Services  Follow up required by staff          Psychosocial Re-Evaluation:  Psychosocial Re-Evaluation     Row Name 02/04/24 0944 03/10/24 0935 03/31/24 9077         Psychosocial Re-Evaluation   Current issues with  None Identified None Identified None Identified     Comments Everette is doing well and denies having any stress at  this time. He has a good support system made up of his wife. He reports having no sleep concerns at this time, he states that he wakes up occasionally but it doesnt bother him. Stoney continues to do well and does not have any stress at this time. he states that he gets regular sleep, other waking up a few times to use the bathroom. Dhaval continues to do well and is not dealing with any stress at this time. He has a good support system made up of his wife and family. He continues to get regular sleep, and is not having any sleep concerns.     Expected Outcomes Short: Continue to utilize support system, and exercise to relieve stress. Long: Continue to attend [facility] to manage stress. Short: Continue to utilize supports system to deal with stress. Long: Continue to attend [facility] to manage stress. Short: Continue to utilize supports system to deal with stress. Long: Continue to attend [facility] to manage stress.     Interventions Encouraged to attend Pulmonary Rehabilitation for the exercise Encouraged to attend Pulmonary Rehabilitation for the exercise Encouraged to attend Pulmonary Rehabilitation for the exercise     Continue Psychosocial Services  Follow up required by staff Follow up required by staff Follow up required by staff        Psychosocial Discharge (Final Psychosocial Re-Evaluation):  Psychosocial Re-Evaluation - 03/31/24 9077       Psychosocial Re-Evaluation   Current issues with None Identified    Comments Benney continues to do well and is not dealing with any stress at this time. He has a good support system made up of his wife and family. He continues to get regular sleep, and is not having any sleep concerns.    Expected Outcomes Short: Continue to utilize supports system to deal with stress. Long: Continue to attend [facility] to manage stress.    Interventions Encouraged to attend Pulmonary Rehabilitation for the exercise    Continue Psychosocial Services  Follow up required by  staff          Education: Education Goals: Education classes will be provided on a weekly basis, covering required topics. Participant will state understanding/return demonstration of topics presented.  Learning Barriers/Preferences:  Learning Barriers/Preferences - 12/25/23 1009       Learning Barriers/Preferences   Learning Barriers None    Learning Preferences None          General Pulmonary Education Topics:  Infection Prevention: - Provides verbal and written material to individual with discussion of infection control including proper hand washing and proper equipment cleaning during exercise session. Flowsheet Row Pulmonary Rehab from 01/07/2024 in [facility] Cardiac and Pulmonary Rehab  Date 01/07/24  Educator NT  Instruction Review Code 1- Verbalizes Understanding    Falls Prevention: - Provides verbal and written material to individual with discussion of falls prevention and safety. Flowsheet Row Pulmonary Rehab from 01/07/2024 in [facility] Cardiac and Pulmonary Rehab  Date 01/07/24  Educator NT  Instruction Review Code 1- Verbalizes Understanding    Chronic Lung Disease Review: - Group verbal instruction with posters, models, PowerPoint presentations and videos,  to review new updates, new respiratory medications, new advancements in procedures and treatments. Providing information on websites and 800 numbers for continued self-education. Includes information about supplement oxygen, available portable oxygen systems, continuous and intermittent flow rates, oxygen safety, concentrators, and Medicare reimbursement for  oxygen. Explanation of Pulmonary Drugs, including class, frequency, complications, importance of spacers, rinsing mouth after steroid MDI's, and proper cleaning methods for nebulizers. Review of basic lung anatomy and physiology related to function, structure, and complications of lung disease. Review of risk factors. Discussion about methods for diagnosing sleep  apnea and types of masks and machines for OSA. Includes a review of the use of types of environmental controls: home humidity, furnaces, filters, dust mite/pet prevention, HEPA vacuums. Discussion about weather changes, air quality and the benefits of nasal washing. Instruction on Warning signs, infection symptoms, calling MD promptly, preventive modes, and value of vaccinations. Review of effective airway clearance, coughing and/or vibration techniques. Emphasizing that all should Create an Action Plan. Written material provided at class time. Flowsheet Row Pulmonary Rehab from 01/07/2024 in [facility] Cardiac and Pulmonary Rehab  Education need identified 01/07/24    AED/CPR: - Group verbal and written instruction with the use of models to demonstrate the basic use of the AED with the basic ABC's of resuscitation.    Tests and Procedures:  - Group verbal and visual presentation and models provide information about basic cardiac anatomy and function. Reviews the testing methods done to diagnose heart disease and the outcomes of the test results. Describes the treatment choices: Medical Management, Angioplasty, or Coronary Bypass Surgery for treating various heart conditions including Myocardial Infarction, Angina, Valve Disease, and Cardiac Arrhythmias.  Written material provided at class time.   Medication Safety: - Group verbal and visual instruction to review commonly prescribed medications for heart and lung disease. Reviews the medication, class of the drug, and side effects. Includes the steps to properly store meds and maintain the prescription regimen.  Written material given at graduation.   Other: -Provides group and verbal instruction on various topics (see comments)   Knowledge Questionnaire Score:  Knowledge Questionnaire Score - 01/07/24 0954       Knowledge Questionnaire Score   Pre Score 13/18           Core Components/Risk Factors/Patient Goals at Admission:  Personal  Goals and Risk Factors at Admission - 12/25/23 1008       Core Components/Risk Factors/Patient Goals on Admission    Weight Management Weight Maintenance;Yes    Intervention Weight Management: Provide education and appropriate resources to help participant work on and attain dietary goals.;Weight Management: Develop a combined nutrition and exercise program designed to reach desired caloric intake, while maintaining appropriate intake of nutrient and fiber, sodium and fats, and appropriate energy expenditure required for the weight goal.    Expected Outcomes Long Term: Adherence to nutrition and physical activity/exercise program aimed toward attainment of established weight goal;Short Term: Continue to assess and modify interventions until short term weight is achieved;Weight Maintenance: Understanding of the daily nutrition guidelines, which includes 25-35% calories from fat, 7% or less cal from saturated fats, less than 200mg  cholesterol, less than 1.5gm of sodium, & 5 or more servings of fruits and vegetables daily;Understanding recommendations for meals to include 15-35% energy as protein, 25-35% energy from fat, 35-60% energy from carbohydrates, less than 200mg  of dietary cholesterol, 20-35 gm of total fiber daily;Understanding of distribution of calorie intake throughout the day with the consumption of 4-5 meals/snacks    Diabetes Yes    Intervention Provide education about signs/symptoms and action to take for hypo/hyperglycemia.;Provide education about proper nutrition, including hydration, and aerobic/resistive exercise prescription along with prescribed medications to achieve blood glucose in normal ranges: Fasting glucose 65-99 mg/dL    Expected Outcomes Short  Term: Participant verbalizes understanding of the signs/symptoms and immediate care of hyper/hypoglycemia, proper foot care and importance of medication, aerobic/resistive exercise and nutrition plan for blood glucose control.;Long Term:  Attainment of HbA1C < 7%.    Hypertension Yes    Intervention Provide education on lifestyle modifcations including regular physical activity/exercise, weight management, moderate sodium restriction and increased consumption of fresh fruit, vegetables, and low fat dairy, alcohol moderation, and smoking cessation.;Monitor prescription use compliance.    Expected Outcomes Short Term: Continued assessment and intervention until BP is < 140/14mm HG in hypertensive participants. < 130/36mm HG in hypertensive participants with diabetes, heart failure or chronic kidney disease.;Long Term: Maintenance of blood pressure at goal levels.    Lipids Yes    Intervention Provide education and support for participant on nutrition & aerobic/resistive exercise along with prescribed medications to achieve LDL 70mg , HDL >40mg .    Expected Outcomes Short Term: Participant states understanding of desired cholesterol values and is compliant with medications prescribed. Participant is following exercise prescription and nutrition guidelines.;Long Term: Cholesterol controlled with medications as prescribed, with individualized exercise RX and with personalized nutrition plan. Value goals: LDL < 70mg , HDL > 40 mg.          Education:Diabetes - Individual verbal and written instruction to review signs/symptoms of diabetes, desired ranges of glucose level fasting, after meals and with exercise. Acknowledge that pre and post exercise glucose checks will be done for 3 sessions at entry of program. Flowsheet Row Pulmonary Rehab from 01/07/2024 in [facility] Cardiac and Pulmonary Rehab  Date 12/25/23  Educator [facility]  Instruction Review Code 1- Verbalizes Understanding    Know Your Numbers and Heart Failure: - Group verbal and visual instruction to discuss disease risk factors for cardiac and pulmonary disease and treatment options.  Reviews associated critical values for Overweight/Obesity, Hypertension, Cholesterol, and Diabetes.   Discusses basics of heart failure: signs/symptoms and treatments.  Introduces Heart Failure Zone chart for action plan for heart failure. Written material provided at class time.   Core Components/Risk Factors/Patient Goals Review:   Goals and Risk Factor Review     Row Name 02/04/24 0948 03/10/24 9062 03/31/24 0923         Core Components/Risk Factors/Patient Goals Review   Personal Goals Review Weight Management/Obesity;Diabetes;Hypertension Weight Management/Obesity;Diabetes;Hypertension Weight Management/Obesity;Diabetes;Hypertension     Review Keahi states that he would like to lose some weight but it is not a huge priority for him today, and he is comfortable with maintaining. He is taking both his BP and blood sugar every day. He states that his systolic BP stays around 110, and his blood sugar is in the mid 100's. Milan is comfortable maintaining his weight at this time. He is not checking his BP at home, and does not have access to a cuff. We discussed the improtance of obtaining a cuff and checking his BP regularly to manage his hypertension. Rodolph is checking his blood sugar everyday, and it continues to be in a healthy range. He continues to take all of his medications as prescribed. Mahki is checking his BP at home but infrequently, he states he does not have a BP cuff but goes to the drug store to access their cuff. Bekim is working on maintaining his weight at this time. He continues to check his blood sugar in the mornings, daily. He is taking all medications as prescribed by his doctor.     Expected Outcomes Short: Continue to take BP and sugar daily, maintain weight. Long: Continue to  exercise and diet in order to manage hypertension, and weight goals. Short: Continue to take BP and sugar daily, maintain weight. Long: Continue to exercise and diet in order to manage hypertension, and weight goals. Short: Continue to take BP and sugar daily, maintain weight. Long: Continue to exercise  and diet in order to manage hypertension, and weight goals.        Core Components/Risk Factors/Patient Goals at Discharge (Final Review):   Goals and Risk Factor Review - 03/31/24 0923       Core Components/Risk Factors/Patient Goals Review   Personal Goals Review Weight Management/Obesity;Diabetes;Hypertension    Review Matteus is checking his BP at home but infrequently, he states he does not have a BP cuff but goes to the drug store to access their cuff. Coby is working on maintaining his weight at this time. He continues to check his blood sugar in the mornings, daily. He is taking all medications as prescribed by his doctor.    Expected Outcomes Short: Continue to take BP and sugar daily, maintain weight. Long: Continue to exercise and diet in order to manage hypertension, and weight goals.          ITP Comments:  ITP Comments     Row Name 12/25/23 1016 01/07/24 0929 01/14/24 0916 01/15/24 0944 02/12/24 1125   ITP Comments Initial phone call completed. Diagnosis can be found in [facility] 9/15. EP Orientation scheduled for Tuesday 10/14 at 8am. Completed and gym orientation for pulmonary rehab. Initial ITP created and sent for review to Dr. Fuad Aleskerov, Medical Director. First full day of exercise!  Patient was oriented to gym and equipment including functions, settings, policies, and procedures.  Patient's individual exercise prescription and treatment plan were reviewed.  All starting workloads were established based on the results of the 6 minute walk test done at initial orientation visit.  The plan for exercise progression was also introduced and progression will be customized based on patient's performance and goals. 30 Day review completed. Medical Director ITP review done, changes made as directed, and signed approval by Medical Director. 30 Day review completed. Medical Director ITP review done, changes made as directed, and signed approval by Medical Director.    Row Name  03/11/24 1320 04/08/24 1040         ITP Comments 30 Day review completed. Medical Director ITP review done, changes made as directed, and signed approval by Medical Director. 30 Day review completed. Medical Director ITP review done, changes made as directed, and signed approval by Medical Director.         Comments: 30 day review      [1]  Current Outpatient Medications:    albuterol  (PROVENTIL ) (2.5 MG/3ML) 0.083% nebulizer solution, USE ONE ampule via NEBULIZER EVERY 6 HOURS AS NEEDED SHORTNESS OF BREATH OR wheezing, Disp: 180 mL, Rfl: 3   albuterol  (VENTOLIN  HFA) 108 (90 Base) MCG/ACT inhaler, INHALE 1 TO 2 PUFFS BY MOUTH EVERY 4 HOURS AS NEEDED FOR WHEEZING OR FOR SHORTNESS OF BREATH, Disp: 8.5 g, Rfl: 1   amLODipine  (NORVASC ) 10 MG tablet, Take 1 tablet (10 mg total) by mouth daily., Disp: 90 tablet, Rfl: 1   aspirin 81 MG tablet, Take 81 mg by mouth., Disp: , Rfl:    atorvastatin  (LIPITOR) 40 MG tablet, Take 1 tablet (40 mg total) by mouth daily., Disp: 90 tablet, Rfl: 1   B-D UF III MINI PEN NEEDLES 31G X 5 MM MISC, , Disp: , Rfl:    benzonatate  (TESSALON )  100 MG capsule, Take 1 capsule (100 mg total) by mouth 2 (two) times daily as needed for cough., Disp: 20 capsule, Rfl: 0   bisoprolol  (ZEBETA ) 5 MG tablet, TAKE ONE TABLET BY MOUTH TWICE DAILY, Disp: 60 tablet, Rfl: 2   budesonide-glycopyrrolate -formoterol  (BREZTRI  AEROSPHERE) 160-9-4.8 MCG/ACT AERO inhaler, Inhale 2 puffs into the lungs 2 (two) times daily., Disp: 10.7 g, Rfl: 11   budesonide-glycopyrrolate -formoterol  (BREZTRI  AEROSPHERE) 160-9-4.8 MCG/ACT AERO inhaler, Inhale 2 puffs into the lungs in the morning and at bedtime., Disp: 10.8 g, Rfl:    dicyclomine  (BENTYL ) 10 MG capsule, TAKE ONE CAPSULE BY MOUTH THREE TIMES DAILY AS NEEDED FOR MUSCLE SPASMS, Disp: 270 capsule, Rfl: 0   Dulaglutide 0.75 MG/0.5ML SOPN, Inject 3 mg into the skin once a week., Disp: , Rfl:    Dupilumab  (DUPIXENT ) 300 MG/2ML SOAJ, Inject 300 mg  into the skin every 14 (fourteen) days., Disp: 4 mL, Rfl: 3   glucose blood (ONETOUCH VERIO) test strip, , Disp: , Rfl:    insulin degludec (TRESIBA) 100 UNIT/ML FlexTouch Pen, Inject 26 Units into the skin daily., Disp: , Rfl:    losartan -hydrochlorothiazide (HYZAAR) 100-12.5 MG tablet, Take 1 tablet by mouth daily., Disp: 90 tablet, Rfl: 1   pantoprazole  (PROTONIX ) 20 MG tablet, TAKE ONE TABLET BY MOUTH ONCE DAILY, Disp: 90 tablet, Rfl: 1   sildenafil  (VIAGRA ) 100 MG tablet, Take 1 tablet (100 mg total) by mouth as needed for erectile dysfunction., Disp: 30 tablet, Rfl: 5  Current Facility-Administered Medications:    ipratropium-albuterol  (DUONEB) 0.5-2.5 (3) MG/3ML nebulizer solution 3 mL, 3 mL, Nebulization, Once,  [2]  Social History Tobacco Use  Smoking Status Former   Current packs/day: 0.00   Average packs/day: 1 pack/day for 41.0 years (41.0 ttl pk-yrs)   Types: Cigarettes   Start date: 03/13/1981   Quit date: 03/13/2022   Years since quitting: 2.0   Passive exposure: Past  Smokeless Tobacco Never  Tobacco Comments   Smoked last on 12/11.  Trying to quit.   Using patches.  HFB  03/13/2022

## 2024-04-09 ENCOUNTER — Encounter: Admitting: Emergency Medicine

## 2024-04-09 DIAGNOSIS — J449 Chronic obstructive pulmonary disease, unspecified: Secondary | ICD-10-CM

## 2024-04-09 NOTE — Progress Notes (Signed)
 Daily Session Note  Patient Details  Name: Zachary Wallace MRN: [id number] Date of Birth: [birth year] Referring Provider:   Flowsheet Row Pulmonary Rehab from 01/07/2024 in [facility] Cardiac and Pulmonary Rehab  Referring Provider Dr. Darrin Barn, MD    Encounter Date: 04/09/2024  Check In:  Session Check In - 04/09/24 0933       Check-In   Supervising physician immediately available to respond to emergencies See telemetry face sheet for immediately available ER MD    Location [facility]-Cardiac & Pulmonary Rehab    Staff Present Leita Franks RN,BSN;[facility] BS, Exercise Physiologist;Noah Tickle, BS, Exercise Physiologist    Virtual Visit No    Medication changes reported     No    Fall or balance concerns reported    No    Tobacco Cessation No Change    Warm-up and Cool-down Performed on first and last piece of equipment    Resistance Training Performed Yes    VAD Patient? No    PAD/SET Patient? No      Pain Assessment   Currently in Pain? No/denies             Tobacco Use History[1]  Goals Met:  Proper associated with RPD/PD & O2 Sat Independence with exercise equipment Using PLB without cueing & demonstrates good technique Exercise tolerated well No report of concerns or symptoms today Strength training completed today  Goals Unmet:  Not Applicable  Comments: Pt able to follow exercise prescription today without complaint.  Will continue to monitor for progression.    Dr. Oneil Pinal is Medical Director for [facility] Cardiac Rehabilitation.  Dr. Fuad Aleskerov is Medical Director for [facility] Pulmonary Rehabilitation.    [1]  Social History Tobacco Use  Smoking Status Former   Current packs/day: 0.00   Average packs/day: 1 pack/day for 41.0 years (41.0 ttl pk-yrs)   Types: Cigarettes   Start date: 03/13/1981   Quit date: 03/13/2022   Years since quitting: 2.0   Passive exposure: Past  Smokeless Tobacco Never  Tobacco  Comments   Smoked last on 12/11.  Trying to quit.   Using patches.  HFB  03/13/2022

## 2024-04-13 ENCOUNTER — Other Ambulatory Visit (HOSPITAL_COMMUNITY): Payer: Self-pay

## 2024-04-13 ENCOUNTER — Other Ambulatory Visit: Payer: Self-pay

## 2024-04-14 ENCOUNTER — Encounter

## 2024-04-14 ENCOUNTER — Other Ambulatory Visit: Payer: Self-pay

## 2024-04-14 DIAGNOSIS — J449 Chronic obstructive pulmonary disease, unspecified: Secondary | ICD-10-CM

## 2024-04-14 NOTE — Progress Notes (Signed)
 Daily Session Note  Patient Details  Name: Zachary Wallace MRN: [id number] Date of Birth: 12-Apr-1955 Referring Provider:   Flowsheet Row Pulmonary Rehab from 01/07/2024 in [facility] Cardiac and Pulmonary Rehab  Referring Provider Dr. Darrin Barn, MD    Encounter Date: 04/14/2024  Check In:  Session Check In - 04/14/24 0924       Check-In   Supervising physician immediately available to respond to emergencies See telemetry face sheet for immediately available ER MD    Location [facility]-Cardiac & Pulmonary Rehab    Staff Present Burnard Davenport RN,BSN,MPA;Maxon Conetta BS, Exercise Physiologist;Margaret Best, MS, Exercise Physiologist;Jason Elnor RDN,LDN    Virtual Visit No    Medication changes reported     No    Fall or balance concerns reported    No    Tobacco Cessation No Change    Warm-up and Cool-down Performed on first and last piece of equipment    Resistance Training Performed Yes    VAD Patient? No    PAD/SET Patient? No      Pain Assessment   Currently in Pain? No/denies             Tobacco Use History[1]  Goals Met:  Proper associated with RPD/PD & O2 Sat Independence with exercise equipment Using PLB without cueing & demonstrates good technique Exercise tolerated well No report of concerns or symptoms today Strength training completed today  Goals Unmet:  Not Applicable  Comments: Pt able to follow exercise prescription today without complaint.  Will continue to monitor for progression.    Dr. Oneil Pinal is Medical Director for [facility] Cardiac Rehabilitation.  Dr. Fuad Aleskerov is Medical Director for [facility] Pulmonary Rehabilitation.    [1]  Social History Tobacco Use  Smoking Status Former   Current packs/day: 0.00   Average packs/day: 1 pack/day for 41.0 years (41.0 ttl pk-yrs)   Types: Cigarettes   Start date: 03/13/1981   Quit date: 03/13/2022   Years since quitting: 2.0   Passive exposure: Past  Smokeless Tobacco Never  Tobacco  Comments   Smoked last on 12/11.  Trying to quit.   Using patches.  HFB  03/13/2022

## 2024-04-15 ENCOUNTER — Other Ambulatory Visit: Payer: Self-pay | Admitting: Internal Medicine

## 2024-04-15 DIAGNOSIS — N529 Male erectile dysfunction, unspecified: Secondary | ICD-10-CM

## 2024-04-15 DIAGNOSIS — J42 Unspecified chronic bronchitis: Secondary | ICD-10-CM

## 2024-04-15 NOTE — Telephone Encounter (Signed)
 Requested medication (s) are due for refill today - expired Rx  Requested medication (s) are on the active medication list -yes  Future visit scheduled -no  Last refill: 02/05/23 #30 5RF  Notes to clinic: fails lab protocol- over 1 year- 06/28/22, expired Rx  Requested Prescriptions  Pending Prescriptions Disp Refills   sildenafil  (VIAGRA ) 100 MG tablet [Pharmacy Med Name: sildenafil  100 mg tablet] 30 tablet 5    Sig: TAKE ONE TABLET BY MOUTH EVERY DAY AS NEEDED FOR ERECTILE DYSFUNCTION     Urology: Erectile Dysfunction Agents Failed - 04/15/2024 12:36 PM      Failed - AST in normal range and within 360 days    AST  Date Value Ref Range Status  06/28/2022 23 10 - 35 U/L Final         Failed - ALT in normal range and within 360 days    ALT  Date Value Ref Range Status  06/28/2022 15 9 - 46 U/L Final         Failed - Last BP in normal range    BP Readings from Last 1 Encounters:  04/08/24 (!) 142/78         Passed - Valid encounter within last 12 months    Recent Outpatient Visits           1 week ago COPD with acute exacerbation (HCC)   [place] [facility] Gasper Nancyann BRAVO, MD   4 months ago Essential hypertension   [facility] Bernardo Fend, DO   5 months ago COPD exacerbation [facility])   [facility] Gareth Clarity F, FNP   6 months ago COPD exacerbation [facility])   [place] [facility] Bernardo Fend, DO   8 months ago COPD exacerbation [facility])   [place] [facility] Bernardo Fend, [place]              Signed Prescriptions Disp Refills   albuterol  (VENTOLIN  HFA) 108 (90 Base) MCG/ACT inhaler 8.5 g 1    Sig: INHALE 1 TO 2 PUFFS BY MOUTH EVERY 4 HOURS AS NEEDED FOR WHEEZING OR FOR SHORTNESS OF BREATH     Pulmonology:  Beta Agonists 2 Failed - 04/15/2024 12:36 PM      Failed - Last BP in normal range    BP Readings from Last 1 Encounters:  04/08/24 (!)  142/78         Passed - Last Heart Rate in normal range    Pulse Readings from Last 1 Encounters:  04/08/24 82         Passed - Valid encounter within last 12 months    Recent Outpatient Visits           1 week ago COPD with acute exacerbation (HCC)   [place] [facility] Gasper Nancyann BRAVO, MD   4 months ago Essential hypertension   [facility] Fend, DO   5 months ago COPD exacerbation [facility])   [facility] Gareth Clarity FALCON, FNP   6 months ago COPD exacerbation [facility])   [facility] Bernardo Fend, DO   8 months ago COPD exacerbation [facility])   [facility] Bernardo Fend, [place]                 Requested Prescriptions  Pending Prescriptions Disp Refills   sildenafil  (VIAGRA ) 100 MG tablet [Pharmacy Med Name: sildenafil  100 mg  tablet] 30 tablet 5    Sig: TAKE ONE TABLET BY MOUTH EVERY DAY AS NEEDED FOR ERECTILE DYSFUNCTION     Urology: Erectile Dysfunction Agents Failed - 04/15/2024 12:36 PM      Failed - AST in normal range and within 360 days    AST  Date Value Ref Range Status  06/28/2022 23 10 - 35 U/L Final         Failed - ALT in normal range and within 360 days    ALT  Date Value Ref Range Status  06/28/2022 15 9 - 46 U/L Final         Failed - Last BP in normal range    BP Readings from Last 1 Encounters:  04/08/24 (!) 142/78         Passed - Valid encounter within last 12 months    Recent Outpatient Visits           1 week ago COPD with acute exacerbation (HCC)   [place] [facility] Gasper Nancyann BRAVO, MD   4 months ago Essential hypertension   [facility] Bernardo Fend, DO   5 months ago COPD exacerbation [facility])   [facility] Gareth Clarity F, FNP   6 months ago COPD exacerbation [facility])   [place] Bernardo Fend, DO   8 months ago COPD exacerbation [facility])   [facility] Bernardo Fend, [place]              Signed Prescriptions Disp Refills   albuterol  (VENTOLIN  HFA) 108 (90 Base) MCG/ACT inhaler 8.5 g 1    Sig: INHALE 1 TO 2 PUFFS BY MOUTH EVERY 4 HOURS AS NEEDED FOR WHEEZING OR FOR SHORTNESS OF BREATH     Pulmonology:  Beta Agonists 2 Failed - 04/15/2024 12:36 PM      Failed - Last BP in normal range    BP Readings from Last 1 Encounters:  04/08/24 (!) 142/78         Passed - Last Heart Rate in normal range    Pulse Readings from Last 1 Encounters:  04/08/24 82         Passed - Valid encounter within last 12 months    Recent Outpatient Visits           1 week ago COPD with acute exacerbation (HCC)   [place] [facility] Gasper Nancyann BRAVO, MD   4 months ago Essential hypertension   [facility] Bernardo Fend, DO   5 months ago COPD exacerbation [facility])   [facility] Gareth Clarity FALCON, FNP   6 months ago COPD exacerbation [facility])   [facility] [place] Bernardo Fend, DO   8 months ago COPD exacerbation [facility])   [facility] Bernardo Fend, [place]

## 2024-04-15 NOTE — Telephone Encounter (Signed)
 Requested Prescriptions  Pending Prescriptions Disp Refills   albuterol  (VENTOLIN  HFA) 108 (90 Base) MCG/ACT inhaler [Pharmacy Med Name: albuterol  sulfate HFA 90 mcg/actuation aerosol inhaler] 8.5 g 1    Sig: INHALE 1 TO 2 PUFFS BY MOUTH EVERY 4 HOURS AS NEEDED FOR WHEEZING OR FOR SHORTNESS OF BREATH     Pulmonology:  Beta Agonists 2 Failed - 04/15/2024 12:35 PM      Failed - Last BP in normal range    BP Readings from Last 1 Encounters:  04/08/24 (!) 142/78         Passed - Last Heart Rate in normal range    Pulse Readings from Last 1 Encounters:  04/08/24 82         Passed - Valid encounter within last 12 months    Recent Outpatient Visits           1 week ago COPD with acute exacerbation (HCC)   [place] [facility] Gasper Nancyann BRAVO, MD   4 months ago Essential hypertension   [place] [facility] Bern[facility] Fend, DO   5 months ago COPD exacerbation [facility])   [facility] Gareth Mliss FALCON, FNP   6 months ago COPD exacerbation [facility])   [facility] Bernardo Fend, DO   8 months ago COPD exacerbation [facility])   [place] [facility] Bernardo Fend, DO               sildenafil  (VIAGRA ) 100 MG tablet [Pharmacy Med Name: sildenafil  100 mg tablet] 30 tablet 5    Sig: TAKE ONE TABLET BY MOUTH EVERY DAY AS NEEDED FOR ERECTILE DYSFUNCTION     Urology: Erectile Dysfunction Agents Failed - 04/15/2024 12:35 PM      Failed - AST in normal range and within 360 days    AST  Date Value Ref Range Status  06/28/2022 23 10 - 35 U/L Final         Failed - ALT in normal range and within 360 days    ALT  Date Value Ref Range Status  06/28/2022 15 9 - 46 U/L Final         Failed - Last BP in normal range    BP Readings from Last 1 Encounters:  04/08/24 (!) 142/78         Passed - Valid encounter within last 12 months    Recent Outpatient Visits           1 week ago  COPD with acute exacerbation (HCC)   [place] [facility] Gasper Nancyann BRAVO, MD   4 months ago Essential hypertension   [facility] Bernardo Fend, DO   5 months ago COPD exacerbation [facility])   [facility] Gareth Mliss FALCON, FNP   6 months ago COPD exacerbation [facility])   [facility] Bernardo Fend, DO   8 months ago COPD exacerbation [facility])   [facility] Bernardo Fend, [place]

## 2024-04-16 ENCOUNTER — Encounter: Admitting: Emergency Medicine

## 2024-04-16 DIAGNOSIS — J449 Chronic obstructive pulmonary disease, unspecified: Secondary | ICD-10-CM | POA: Diagnosis not present

## 2024-04-16 NOTE — Progress Notes (Signed)
 Daily Session Note  Patient Details  Name: Zachary Wallace MRN: [id number] Date of Birth: 1955/10/31 Referring Provider:   Flowsheet Row Pulmonary Rehab from 01/07/2024 in [facility] Cardiac and Pulmonary Rehab  Referring Provider Dr. Darrin Barn, MD    Encounter Date: 04/16/2024  Check In:  Session Check In - 04/16/24 0931       Check-In   Supervising physician immediately available to respond to emergencies See telemetry face sheet for immediately available ER MD    Location [facility]-Cardiac & Pulmonary Rehab    Staff Present Leita Franks RN,BSN;[facility] BS, Exercise Physiologist;Noah Tickle, BS, Exercise Physiologist    Virtual Visit No    Medication changes reported     No    Fall or balance concerns reported    No    Tobacco Cessation No Change    Warm-up and Cool-down Performed on first and last piece of equipment    Resistance Training Performed Yes    VAD Patient? No    PAD/SET Patient? No      Pain Assessment   Currently in Pain? No/denies             Tobacco Use History[1]  Goals Met:  Proper associated with RPD/PD & O2 Sat Independence with exercise equipment Using PLB without cueing & demonstrates good technique Exercise tolerated well No report of concerns or symptoms today Strength training completed today  Goals Unmet:  Not Applicable  Comments: Pt able to follow exercise prescription today without complaint.  Will continue to monitor for progression.    Dr. Oneil Pinal is Medical Director for [facility] Cardiac Rehabilitation.  Dr. Fuad Aleskerov is Medical Director for [facility] Pulmonary Rehabilitation.    [1]  Social History Tobacco Use  Smoking Status Former   Current packs/day: 0.00   Average packs/day: 1 pack/day for 41.0 years (41.0 ttl pk-yrs)   Types: Cigarettes   Start date: 03/13/1981   Quit date: 03/13/2022   Years since quitting: 2.0   Passive exposure: Past  Smokeless Tobacco Never  Tobacco  Comments   Smoked last on 12/11.  Trying to quit.   Using patches.  HFB  03/13/2022

## 2024-04-17 ENCOUNTER — Other Ambulatory Visit: Payer: Self-pay

## 2024-04-20 ENCOUNTER — Other Ambulatory Visit: Payer: Self-pay

## 2024-04-21 ENCOUNTER — Telehealth: Payer: Self-pay

## 2024-04-21 ENCOUNTER — Encounter

## 2024-04-21 DIAGNOSIS — J439 Emphysema, unspecified: Secondary | ICD-10-CM

## 2024-04-21 NOTE — Telephone Encounter (Signed)
 Received notification via specialty pharmacy encounter that pt's copay for Dupixent  is currently $1,182.44. Sent message to pt to see if he would like to enroll in [facility]. Will await pt's response.

## 2024-04-22 ENCOUNTER — Other Ambulatory Visit: Payer: Self-pay

## 2024-04-23 ENCOUNTER — Other Ambulatory Visit: Payer: Self-pay

## 2024-04-23 ENCOUNTER — Encounter: Admitting: Emergency Medicine

## 2024-04-23 ENCOUNTER — Other Ambulatory Visit: Payer: Self-pay | Admitting: Internal Medicine

## 2024-04-23 DIAGNOSIS — J449 Chronic obstructive pulmonary disease, unspecified: Secondary | ICD-10-CM | POA: Diagnosis not present

## 2024-04-23 DIAGNOSIS — Z794 Long term (current) use of insulin: Secondary | ICD-10-CM

## 2024-04-23 NOTE — Telephone Encounter (Unsigned)
 Copied from CRM #[id number]. Topic: Referral - Request for Referral >> Apr 23, 2024 12:31 PM Lonell PEDLAR wrote: Did the patient discuss referral with their provider in the last year? No (If No - schedule appointment) (If Yes - send message)  Appointment offered? No  Type of order/referral and detailed reason for visit: Nephrologist. Patient was previously established with provider who is now OON with patient's insurance.   Preference of office, provider, location: [place]   If referral order, have you been seen by this specialty before? Yes (If Yes, this issue or another issue? When? Where?  Can we respond through MyChart? Yes >> Apr 23, 2024 12:40 PM Hadassah PARAS wrote: Prev kidney dr was Teresia Space, MD, cannot no longer have due to insurance as it requires co-pay

## 2024-04-23 NOTE — Telephone Encounter (Signed)
 Spoke to patient need new Nephrologist due to insurance change. Can be a provider in [place]

## 2024-04-23 NOTE — Progress Notes (Signed)
 Daily Session Note  Patient Details  Name: Zachary Wallace MRN: [id number] Date of Birth: 11-11-1955 Referring Provider:   Flowsheet Row Pulmonary Rehab from 01/07/2024 in [facility] Cardiac and Pulmonary Rehab  Referring Provider Dr. Darrin Barn, MD    Encounter Date: 04/23/2024  Check In:  Session Check In - 04/23/24 0959       Check-In   Supervising physician immediately available to respond to emergencies See telemetry face sheet for immediately available ER MD    Location [facility]-Cardiac & Pulmonary Rehab    Staff Present Maxon Conetta BS, Exercise Physiologist;Ordell Prichett RN,BSN;Margaret Best, MS, Exercise Physiologist;Jason Elnor RDN,LDN    Virtual Visit No    Medication changes reported     No    Fall or balance concerns reported    No    Tobacco Cessation No Change    Warm-up and Cool-down Performed on first and last piece of equipment    Resistance Training Performed Yes    VAD Patient? No    PAD/SET Patient? No      Pain Assessment   Currently in Pain? No/denies             Tobacco Use History[1]  Goals Met:  Proper associated with RPD/PD & O2 Sat Independence with exercise equipment Using PLB without cueing & demonstrates good technique Exercise tolerated well No report of concerns or symptoms today Strength training completed today  Goals Unmet:  Not Applicable  Comments: Pt able to follow exercise prescription today without complaint.  Will continue to monitor for progression.    Dr. Oneil Pinal is Medical Director for [facility] Cardiac Rehabilitation.  Dr. Fuad Aleskerov is Medical Director for [facility] Pulmonary Rehabilitation.    [1]  Social History Tobacco Use  Smoking Status Former   Current packs/day: 0.00   Average packs/day: 1 pack/day for 41.0 years (41.0 ttl pk-yrs)   Types: Cigarettes   Start date: 03/13/1981   Quit date: 03/13/2022   Years since quitting: 2.1   Passive exposure: Past  Smokeless Tobacco Never  Tobacco  Comments   Smoked last on 12/11.  Trying to quit.   Using patches.  HFB  03/13/2022

## 2024-04-24 ENCOUNTER — Ambulatory Visit
Admission: RE | Admit: 2024-04-24 | Discharge: 2024-04-24 | Disposition: A | Source: Ambulatory Visit | Attending: Acute Care | Admitting: Acute Care

## 2024-04-24 ENCOUNTER — Other Ambulatory Visit: Payer: Self-pay

## 2024-04-24 DIAGNOSIS — Z122 Encounter for screening for malignant neoplasm of respiratory organs: Secondary | ICD-10-CM | POA: Insufficient documentation

## 2024-04-24 DIAGNOSIS — Z87891 Personal history of nicotine dependence: Secondary | ICD-10-CM | POA: Diagnosis present

## 2024-04-26 ENCOUNTER — Other Ambulatory Visit: Payer: Self-pay | Admitting: Internal Medicine

## 2024-04-26 DIAGNOSIS — K219 Gastro-esophageal reflux disease without esophagitis: Secondary | ICD-10-CM

## 2024-04-28 ENCOUNTER — Encounter

## 2024-04-28 ENCOUNTER — Other Ambulatory Visit: Payer: Self-pay

## 2024-04-28 ENCOUNTER — Other Ambulatory Visit: Payer: Self-pay | Admitting: Acute Care

## 2024-04-28 DIAGNOSIS — Z87891 Personal history of nicotine dependence: Secondary | ICD-10-CM

## 2024-04-28 DIAGNOSIS — Z122 Encounter for screening for malignant neoplasm of respiratory organs: Secondary | ICD-10-CM

## 2024-04-30 ENCOUNTER — Encounter

## 2024-04-30 ENCOUNTER — Ambulatory Visit

## 2024-04-30 DIAGNOSIS — J42 Unspecified chronic bronchitis: Secondary | ICD-10-CM

## 2024-04-30 DIAGNOSIS — J449 Chronic obstructive pulmonary disease, unspecified: Secondary | ICD-10-CM

## 2024-04-30 MED ORDER — DUPIXENT 300 MG/2ML ~~LOC~~ SOAJ
300.0000 mg | SUBCUTANEOUS | 3 refills | Status: AC
  Filled 2024-05-01: qty 4, 28d supply, fill #0

## 2024-04-30 NOTE — Addendum Note (Signed)
 Addended by: Amarius Toto L on: 04/30/2024 04:42 PM   Modules accepted: Orders

## 2024-04-30 NOTE — Progress Notes (Signed)
 [facility]  Referring Provider: Dr. Malka  Virtual Visit via Telephone Note  I connected with Zachary Wallace on 04/30/24 at  4:40 PM EST by telephone and verified that I am speaking with the correct person using two identifiers.  Location: Patient: home Provider: office   I discussed the limitations, risks, security and privacy concerns of performing an evaluation and management service by telephone and the availability of in person appointments. I also discussed with the patient that there may be a patient responsible charge related to this service. The patient expressed understanding and agreed to proceed.  HPI: Zachary Wallace is a 69 y.o. male who presents to the pharmacotherapy clinic via telephone for follow-up Dupixent  counseling. He has been off Dupixent  since December 2025 due to cost.  Patient Active Problem List   Diagnosis Date Noted   CKD (chronic kidney disease) stage 4, GFR 15-29 ml/min (HCC) 05/28/2023   Type 2 diabetes mellitus with stage 3b chronic kidney disease, with long-term current use of insulin (HCC) 05/28/2023   Chronic bronchitis (HCC) 05/28/2023   Dyspnea 03/13/2022   Mixed hyperlipidemia 05/20/2020   OSA (obstructive sleep apnea) 06/04/2018   Tubular adenoma of colon    Low HDL (under 40) 02/13/2017   Cigarette smoker 05/16/2016   High triglycerides 05/16/2016   COPD GOLD 3 02/10/2015   Lumbar radiculopathy 01/10/2015   Left hip pain 01/10/2015   Abnormal presence of protein in urine 10/06/2013   Vitamin D  deficiency 10/11/2011   Diabetes mellitus (HCC) 10/11/2011   Chronic kidney disease (CKD), stage III (moderate) (HCC) 08/15/2010   Essential hypertension 08/15/2010    Patient's Medications  New Prescriptions   No medications on file  Previous Medications   ALBUTEROL  (PROVENTIL ) (2.5 MG/3ML) 0.083% NEBULIZER SOLUTION    USE ONE ampule via NEBULIZER EVERY 6 HOURS AS NEEDED SHORTNESS OF BREATH OR wheezing   ALBUTEROL  (VENTOLIN   HFA) 108 (90 BASE) MCG/ACT INHALER    INHALE 1 TO 2 PUFFS BY MOUTH EVERY 4 HOURS AS NEEDED FOR WHEEZING OR FOR SHORTNESS OF BREATH   AMLODIPINE  (NORVASC ) 10 MG TABLET    Take 1 tablet (10 mg total) by mouth daily.   ASPIRIN 81 MG TABLET    Take 81 mg by mouth.   ATORVASTATIN  (LIPITOR) 40 MG TABLET    Take 1 tablet (40 mg total) by mouth daily.   B-D UF III MINI PEN NEEDLES 31G X 5 MM MISC       BENZONATATE  (TESSALON ) 100 MG CAPSULE    Take 1 capsule (100 mg total) by mouth 2 (two) times daily as needed for cough.   BISOPROLOL  (ZEBETA ) 5 MG TABLET    TAKE ONE TABLET BY MOUTH TWICE DAILY   BUDESONIDE-GLYCOPYRROLATE -FORMOTEROL  (BREZTRI  AEROSPHERE) 160-9-4.8 MCG/ACT AERO INHALER    Inhale 2 puffs into the lungs 2 (two) times daily.   DICYCLOMINE  (BENTYL ) 10 MG CAPSULE    TAKE ONE CAPSULE BY MOUTH THREE TIMES DAILY AS NEEDED FOR MUSCLE SPASMS   DULAGLUTIDE 0.75 MG/0.5ML SOPN    Inject 3 mg into the skin once a week.   DUPILUMAB  (DUPIXENT ) 300 MG/2ML SOAJ    Inject 300 mg into the skin every 14 (fourteen) days.   GLUCOSE BLOOD (ONETOUCH VERIO) TEST STRIP       INSULIN DEGLUDEC (TRESIBA) 100 UNIT/ML FLEXTOUCH PEN    Inject 26 Units into the skin daily.   LOSARTAN -HYDROCHLOROTHIAZIDE (HYZAAR) 100-12.5 MG TABLET    Take 1 tablet by mouth daily.   PANTOPRAZOLE  (  PROTONIX ) 20 MG TABLET    TAKE ONE TABLET BY MOUTH ONCE DAILY   SILDENAFIL  (VIAGRA ) 100 MG TABLET    TAKE ONE TABLET BY MOUTH EVERY DAY AS NEEDED FOR ERECTILE DYSFUNCTION  Modified Medications   No medications on file  Discontinued Medications   No medications on file    Allergies: Allergies[1]  Past Medical History: Past Medical History:  Diagnosis Date   COPD (chronic obstructive pulmonary disease) (HCC)    Diabetes mellitus without complication (HCC)    Hyperlipidemia    Hypertension    OSA (obstructive sleep apnea)     Social History: Social History   Socioeconomic History   Marital status: Married    Spouse name: Not on file    Number of children: Not on file   Years of education: Not on file   Highest education level: Not on file  Occupational History   Occupation: retires custodian [employer]  Tobacco Use   Smoking status: Former    Current packs/day: 0.00    Average packs/day: 1 pack/day for 41.0 years (41.0 ttl pk-yrs)    Types: Cigarettes    Start date: 03/13/1981    Quit date: 03/13/2022    Years since quitting: 2.1    Passive exposure: Past   Smokeless tobacco: Never   Tobacco comments:    Smoked last on 12/11.  Trying to quit.   Using patches.  HFB  03/13/2022  Vaping Use   Vaping status: Never Used  Substance and Sexual Activity   Alcohol use: No    Alcohol/week: 0.0 standard drinks of alcohol   Drug use: No   Sexual activity: Yes    Partners: Male, Male  Other Topics Concern   Not on file  Social History Narrative   Not on file   Social Drivers of Health   Tobacco Use: Medium Risk (04/08/2024)   Patient History    Smoking Tobacco Use: Former    Smokeless Tobacco Use: Never    Passive Exposure: Past  Physicist, Medical Strain: Low Risk (12/19/2023)   Overall Financial Resource Strain (CARDIA)    Difficulty of Paying Living Expenses: Not hard at all  Food Insecurity: No Food Insecurity (12/19/2023)   Epic    Worried About Radiation Protection Practitioner of Food in the Last Year: Never true    Ran Out of Food in the Last Year: Never true  Transportation Needs: No Transportation Needs (12/19/2023)   Epic    Lack of Transportation (Medical): No    Lack of Transportation (Non-Medical): No  Physical Activity: Insufficiently Active (12/19/2023)   Exercise Vital Sign    Days of Exercise per Week: 2 days    Minutes of Exercise per Session: 30 min  Stress: No Stress Concern Present (12/19/2023)   [employer] of Occupational Health - Occupational Stress Questionnaire    Feeling of Stress: Not at all  Social Connections: Moderately Isolated (12/19/2023)   Social Connection and Isolation  Panel    Frequency of Communication with Friends and Family: Once a week    Frequency of Social Gatherings with Friends and Family: Once a week    Attends Religious Services: More than 4 times per year    Active Member of Clubs or Organizations: No    Attends Banker Meetings: Never    Marital Status: Married  Depression (PHQ2-9): Medium Risk (02/04/2024)   Depression (PHQ2-9)    PHQ-2 Score: 5  Alcohol Screen: Low Risk (12/19/2023)   Alcohol Screen  Last Alcohol Screening Score (AUDIT): 0  Housing: Unknown (12/19/2023)   Epic    Unable to Pay for Housing in the Last Year: No    Number of Times Moved in the Last Year: Not on file    Homeless in the Last Year: No  Utilities: Not At Risk (12/19/2023)   Epic    Threatened with loss of utilities: No  Health Literacy: Adequate Health Literacy (12/19/2023)   B1300 Health Literacy    Frequency of need for help with medical instructions: Never    Medication: Dupixent   Goals of therapy: Mechanism: human monoclonal IgG4 antibody that inhibits interleukin-4 and interleukin-13 cytokine-induced responses, including release of proinflammatory cytokines, chemokines, and IgE Reviewed that Dupixent  is add-on medication and patient must continue maintenance inhaler regimen. Response to therapy: may take 4 months to determine efficacy. Discussed that patients generally feel improvement sooner than 4 months.  Side effects: injection site reaction (6-18%), antibody development (5-16%), ophthalmic conjunctivitis (2-16%), transient blood eosinophilia (1-2%)  Dose: 300mg  [place] every 14 days  Administration/Storage:  Reviewed administration sites of thigh or abdomen (at least 2-3 inches away from abdomen). Reviewed the upper arm is only appropriate if caregiver is administering injection  Do not shake pen/syringe as this could lead to product foaming or precipitation. Do not use if solution is discolored or contains particulate matter or if  window on prefilled pen is yellow (indicates pen has been used).  Reviewed storage of medication in refrigerator. Reviewed that Dupixent  can be stored at room temperature in unopened carton for up to 14 days.  Access: Due to high copay in new year (>$1,000), explored options including Medicare Prescription Payment Plan versus meeting max out of pocket cost early in the year. He is interested in exploring M3P. He will call us  with an update after he discusses with [employer].  Medication Reconciliation  A drug regimen assessment was performed, including review of allergies, interactions, disease-state management, dosing and immunization history. Medications were reviewed with the patient, including name, instructions, indication, goals of therapy, potential side effects, importance of adherence, and safe use.  Plan: - Patient to call [employer] to discuss signing up for [facility] - Patient will call [facility] at [phone] or [place] at [phone] with update after he discusses with [facility].  I discussed the assessment and treatment plan with the patient. The patient was provided an opportunity to ask questions and all were answered. The patient agreed with the plan and demonstrated an understanding of the instructions.   The patient was advised to call back or seek an in-person evaluation if the symptoms worsen or if the condition fails to improve as anticipated.  I provided 15 minutes of non-face-to-face time during this encounter.  Aleck Puls, PharmD, BCPS, CPP Clinical Pharmacist  [facility]  [facility] 04/30/2024, 4:44 PM    [1] No Known Allergies

## 2024-04-30 NOTE — Telephone Encounter (Signed)
 Called patient to discuss M3P for Dupixent  since he has not read MyChart message.  Spent 15 minutes discussing M3P and Medicare max OOP $2100 in 2026. He agrees to call [employer] to discuss M3P. He will call [facility] if he signs up for M3P.  Last dose of Dupixent  was in December 2025. Out of Dupixent . Encouraged patient to call to let us  know what he decides after discussing with [facility].   See pharmacotherapy visit 04/30/24.

## 2024-05-01 ENCOUNTER — Other Ambulatory Visit: Payer: Self-pay

## 2024-05-05 ENCOUNTER — Encounter

## 2024-05-07 ENCOUNTER — Encounter

## 2024-06-01 ENCOUNTER — Ambulatory Visit: Admitting: Internal Medicine

## 2024-12-24 ENCOUNTER — Ambulatory Visit
